# Patient Record
Sex: Female | Born: 1935 | Race: Black or African American | Hispanic: No | Smoking: Never smoker
Health system: Southern US, Community
[De-identification: ages and names within clinical notes are randomized; demographics above are authoritative.]

## PROBLEM LIST (undated history)

## (undated) DIAGNOSIS — E785 Hyperlipidemia, unspecified: Secondary | ICD-10-CM

## (undated) DIAGNOSIS — F439 Reaction to severe stress, unspecified: Secondary | ICD-10-CM

## (undated) DIAGNOSIS — E7800 Pure hypercholesterolemia, unspecified: Secondary | ICD-10-CM

## (undated) DIAGNOSIS — M810 Age-related osteoporosis without current pathological fracture: Secondary | ICD-10-CM

## (undated) DIAGNOSIS — I10 Essential (primary) hypertension: Secondary | ICD-10-CM

## (undated) DIAGNOSIS — J45909 Unspecified asthma, uncomplicated: Secondary | ICD-10-CM

## (undated) HISTORY — DX: Age-related osteoporosis without current pathological fracture: M81.0

## (undated) HISTORY — DX: Essential (primary) hypertension: I10

## (undated) HISTORY — DX: Hyperlipidemia, unspecified: E78.5

## (undated) HISTORY — DX: Reaction to severe stress, unspecified: F43.9

---

## 1997-08-12 ENCOUNTER — Ambulatory Visit (HOSPITAL_COMMUNITY): Admission: RE | Admit: 1997-08-12 | Discharge: 1997-08-12 | Payer: Self-pay | Admitting: *Deleted

## 1998-11-24 ENCOUNTER — Other Ambulatory Visit: Admission: RE | Admit: 1998-11-24 | Discharge: 1998-11-24 | Payer: Self-pay | Admitting: Internal Medicine

## 1999-01-14 ENCOUNTER — Ambulatory Visit (HOSPITAL_COMMUNITY): Admission: RE | Admit: 1999-01-14 | Discharge: 1999-01-14 | Payer: Self-pay | Admitting: Gastroenterology

## 1999-10-26 ENCOUNTER — Encounter: Payer: Self-pay | Admitting: Internal Medicine

## 1999-10-26 ENCOUNTER — Encounter: Admission: RE | Admit: 1999-10-26 | Discharge: 1999-10-26 | Payer: Self-pay | Admitting: Internal Medicine

## 2000-10-27 ENCOUNTER — Encounter: Admission: RE | Admit: 2000-10-27 | Discharge: 2000-10-27 | Payer: Self-pay | Admitting: Internal Medicine

## 2000-10-27 ENCOUNTER — Encounter: Payer: Self-pay | Admitting: Internal Medicine

## 2001-05-29 ENCOUNTER — Encounter: Payer: Self-pay | Admitting: Internal Medicine

## 2001-05-29 ENCOUNTER — Encounter: Admission: RE | Admit: 2001-05-29 | Discharge: 2001-05-29 | Payer: Self-pay | Admitting: Internal Medicine

## 2001-10-30 ENCOUNTER — Encounter: Payer: Self-pay | Admitting: Internal Medicine

## 2001-10-30 ENCOUNTER — Encounter: Admission: RE | Admit: 2001-10-30 | Discharge: 2001-10-30 | Payer: Self-pay | Admitting: Internal Medicine

## 2002-04-22 ENCOUNTER — Other Ambulatory Visit: Admission: RE | Admit: 2002-04-22 | Discharge: 2002-04-22 | Payer: Self-pay | Admitting: Internal Medicine

## 2003-07-17 ENCOUNTER — Encounter: Admission: RE | Admit: 2003-07-17 | Discharge: 2003-07-17 | Payer: Self-pay | Admitting: Internal Medicine

## 2003-09-04 ENCOUNTER — Encounter: Admission: RE | Admit: 2003-09-04 | Discharge: 2003-09-04 | Payer: Self-pay | Admitting: Internal Medicine

## 2004-07-30 ENCOUNTER — Encounter: Admission: RE | Admit: 2004-07-30 | Discharge: 2004-07-30 | Payer: Self-pay | Admitting: Internal Medicine

## 2004-11-18 ENCOUNTER — Ambulatory Visit (HOSPITAL_COMMUNITY): Admission: RE | Admit: 2004-11-18 | Discharge: 2004-11-18 | Payer: Self-pay | Admitting: Internal Medicine

## 2004-11-26 ENCOUNTER — Ambulatory Visit (HOSPITAL_COMMUNITY): Admission: RE | Admit: 2004-11-26 | Discharge: 2004-11-26 | Payer: Self-pay | Admitting: Internal Medicine

## 2005-03-28 ENCOUNTER — Ambulatory Visit (HOSPITAL_COMMUNITY): Admission: RE | Admit: 2005-03-28 | Discharge: 2005-03-28 | Payer: Self-pay | Admitting: Gastroenterology

## 2005-05-13 ENCOUNTER — Encounter: Admission: RE | Admit: 2005-05-13 | Discharge: 2005-05-13 | Payer: Self-pay | Admitting: Internal Medicine

## 2005-08-18 ENCOUNTER — Encounter: Admission: RE | Admit: 2005-08-18 | Discharge: 2005-08-18 | Payer: Self-pay | Admitting: Internal Medicine

## 2005-08-30 ENCOUNTER — Encounter: Admission: RE | Admit: 2005-08-30 | Discharge: 2005-08-30 | Payer: Self-pay | Admitting: Internal Medicine

## 2006-08-31 ENCOUNTER — Encounter: Admission: RE | Admit: 2006-08-31 | Discharge: 2006-08-31 | Payer: Self-pay | Admitting: Internal Medicine

## 2007-09-03 ENCOUNTER — Encounter: Admission: RE | Admit: 2007-09-03 | Discharge: 2007-09-03 | Payer: Self-pay | Admitting: Internal Medicine

## 2008-05-08 ENCOUNTER — Other Ambulatory Visit: Admission: RE | Admit: 2008-05-08 | Discharge: 2008-05-08 | Payer: Self-pay | Admitting: Internal Medicine

## 2008-09-04 ENCOUNTER — Encounter: Admission: RE | Admit: 2008-09-04 | Discharge: 2008-09-04 | Payer: Self-pay | Admitting: Internal Medicine

## 2009-09-07 ENCOUNTER — Encounter: Admission: RE | Admit: 2009-09-07 | Discharge: 2009-09-07 | Payer: Self-pay | Admitting: Internal Medicine

## 2010-09-23 ENCOUNTER — Other Ambulatory Visit: Payer: Self-pay | Admitting: Internal Medicine

## 2010-09-23 DIAGNOSIS — Z1231 Encounter for screening mammogram for malignant neoplasm of breast: Secondary | ICD-10-CM

## 2010-09-29 ENCOUNTER — Ambulatory Visit
Admission: RE | Admit: 2010-09-29 | Discharge: 2010-09-29 | Disposition: A | Discharge status: Home / Self Care | Payer: Medicare Other | Source: Ambulatory Visit | Attending: Internal Medicine | Admitting: Internal Medicine

## 2010-09-29 DIAGNOSIS — Z1231 Encounter for screening mammogram for malignant neoplasm of breast: Secondary | ICD-10-CM

## 2010-11-12 NOTE — Op Note (Signed)
Sarah Valenzuela, Sarah Valenzuela               ACCOUNT NO.:  [id number]   MEDICAL RECORD NO.:  [id number]          PATIENT TYPE:  AMB   LOCATION:  ENDO                         FACILITY:  [facility]   PHYSICIAN:  Anselmo Rod, M.D.  DATE OF BIRTH:  08-01-1931   DATE OF PROCEDURE:  03/28/2005  DATE OF DISCHARGE:                                 OPERATIVE REPORT   PROCEDURE PERFORMED:  Screening colonoscopy.   ENDOSCOPIST:  Charna Elizabeth, M.D.   INSTRUMENT USED:  Olympus video colonoscope.   INDICATIONS FOR PROCEDURE:  The patient is a 77 year old African-American  female with a history of adenomatous polyps removed in the past.  Rule out  recurrent polyps.   PREPROCEDURE PREPARATION:  Informed consent was procured from the patient.  The patient was fasted for eight hours prior to the procedure and prepped  OsmoPrep pills the night prior to and the morning of the procedure.  The  risks and benefits of the procedure including a 10% miss rate for polyps or  cancers was discussed with the patient as well.   PREPROCEDURE PHYSICAL:  The patient had stable vital signs.  Neck supple.  Chest clear to auscultation.  S1 and S2 regular.  Abdomen soft with normal  bowel sounds.   DESCRIPTION OF PROCEDURE:  The patient was placed in left lateral decubitus  position and sedated with 75 mg of Demerol and 5 mg of Versed in slow  incremental doses.  Once the patient was adequately sedated and maintained  on low flow oxygen and continuous cardiac monitoring, the Olympus video  colonoscope was advanced from the rectum to the cecum with difficulty.  There was a large amount of residual stool in the colon.  No masses, polyps,  erosions, or ulcerations were seen.  There was no evidence of  diverticulosis.  Melanosis coli was noted throughout the colonic mucosa with  more prominent changes in the right colon.  Retroflexion in the rectum  revealed no abnormalities.  Small internal hemorrhoids were seen on anal  inspection.  The prep was poor, small lesions could have been missed.   IMPRESSION:  1.  Melanosis coli seen throughout the colonic mucosa with more prominent      changes in the right colon.  2.  Small internal hemorrhoids.  3.  Large amount of residual stool in the colon.  No masses or polyps seen.   RECOMMENDATIONS:  1.  Continue high fiber diet with liberal fluid intake.  2.  Repeat colonoscopy in the next five years unless the patient develops      any abnormal symptoms in the interim.  3.  Outpatient followup as need arises in the future.      Anselmo Rod, M.D.  Electronically Signed     JNM/MEDQ  D:  03/28/2005  T:  03/28/2005  Job:  [id number]   cc:   Minerva Areola L. August Saucer, M.D.  Fax: [phone]

## 2011-08-30 ENCOUNTER — Other Ambulatory Visit: Payer: Self-pay | Admitting: Internal Medicine

## 2011-08-30 DIAGNOSIS — Z1231 Encounter for screening mammogram for malignant neoplasm of breast: Secondary | ICD-10-CM

## 2011-10-13 ENCOUNTER — Ambulatory Visit: Payer: BC Managed Care – PPO

## 2011-10-19 ENCOUNTER — Ambulatory Visit
Admission: RE | Admit: 2011-10-19 | Discharge: 2011-10-19 | Disposition: A | Discharge status: Home / Self Care | Payer: BC Managed Care – PPO | Source: Ambulatory Visit | Attending: Internal Medicine | Admitting: Internal Medicine

## 2011-10-19 DIAGNOSIS — Z1231 Encounter for screening mammogram for malignant neoplasm of breast: Secondary | ICD-10-CM

## 2012-06-19 ENCOUNTER — Emergency Department (HOSPITAL_COMMUNITY)
Admission: EM | Admit: 2012-06-19 | Discharge: 2012-06-19 | Disposition: A | Discharge status: Home / Self Care | Payer: No Typology Code available for payment source | Attending: Emergency Medicine | Admitting: Emergency Medicine

## 2012-06-19 ENCOUNTER — Emergency Department (HOSPITAL_COMMUNITY): Payer: No Typology Code available for payment source

## 2012-06-19 ENCOUNTER — Encounter (HOSPITAL_COMMUNITY): Payer: Self-pay | Admitting: *Deleted

## 2012-06-19 DIAGNOSIS — J45909 Unspecified asthma, uncomplicated: Secondary | ICD-10-CM | POA: Insufficient documentation

## 2012-06-19 DIAGNOSIS — S0990XA Unspecified injury of head, initial encounter: Secondary | ICD-10-CM | POA: Insufficient documentation

## 2012-06-19 DIAGNOSIS — Y92410 Unspecified street and highway as the place of occurrence of the external cause: Secondary | ICD-10-CM | POA: Insufficient documentation

## 2012-06-19 DIAGNOSIS — IMO0002 Reserved for concepts with insufficient information to code with codable children: Secondary | ICD-10-CM | POA: Insufficient documentation

## 2012-06-19 DIAGNOSIS — Z79899 Other long term (current) drug therapy: Secondary | ICD-10-CM | POA: Insufficient documentation

## 2012-06-19 DIAGNOSIS — T07XXXA Unspecified multiple injuries, initial encounter: Secondary | ICD-10-CM | POA: Insufficient documentation

## 2012-06-19 DIAGNOSIS — Y9389 Activity, other specified: Secondary | ICD-10-CM | POA: Insufficient documentation

## 2012-06-19 DIAGNOSIS — E7800 Pure hypercholesterolemia, unspecified: Secondary | ICD-10-CM | POA: Insufficient documentation

## 2012-06-19 DIAGNOSIS — Z7982 Long term (current) use of aspirin: Secondary | ICD-10-CM | POA: Insufficient documentation

## 2012-06-19 HISTORY — DX: Unspecified asthma, uncomplicated: J45.909

## 2012-06-19 HISTORY — DX: Pure hypercholesterolemia, unspecified: E78.00

## 2012-06-19 MED ORDER — ACETAMINOPHEN 500 MG PO TABS: 500.0000 mg | ORAL_TABLET | Freq: Four times a day (QID) | ORAL | Status: AC | PRN

## 2012-06-19 MED ORDER — ACETAMINOPHEN 325 MG PO TABS
650.0000 mg | ORAL_TABLET | Freq: Once | ORAL | Status: AC
  Administered 2012-06-19: 650 mg via ORAL
  Filled 2012-06-19: qty 2

## 2012-06-19 NOTE — ED Provider Notes (Signed)
Medical screening examination/treatment/procedure(s) were performed by non-physician practitioner and as supervising physician I was immediately available for consultation/collaboration.  Ethelda Chick, MD 06/19/12 [phone]
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
## 2012-06-19 NOTE — ED Notes (Addendum)
Called to check delay on xray. Radiology sts pt is next.

## 2012-09-17 ENCOUNTER — Other Ambulatory Visit: Payer: Self-pay

## 2012-09-17 DIAGNOSIS — Z1231 Encounter for screening mammogram for malignant neoplasm of breast: Secondary | ICD-10-CM

## 2012-10-17 LAB — BASIC METABOLIC PANEL
Glucose: 83 mg/dL
Potassium: 4.1 mmol/L (ref 3.4–5.3)
Sodium: 142 mmol/L (ref 137–147)

## 2012-10-24 ENCOUNTER — Ambulatory Visit
Admission: RE | Admit: 2012-10-24 | Discharge: 2012-10-24 | Disposition: A | Discharge status: Home / Self Care | Payer: Medicare PPO | Source: Ambulatory Visit

## 2012-10-24 DIAGNOSIS — Z1231 Encounter for screening mammogram for malignant neoplasm of breast: Secondary | ICD-10-CM

## 2012-10-30 ENCOUNTER — Encounter: Payer: Self-pay | Admitting: Hematology

## 2012-11-14 ENCOUNTER — Encounter: Payer: Self-pay | Admitting: Internal Medicine

## 2012-11-14 ENCOUNTER — Ambulatory Visit (INDEPENDENT_AMBULATORY_CARE_PROVIDER_SITE_OTHER): Payer: Medicare PPO | Admitting: Internal Medicine

## 2012-11-14 VITALS — BP 120/58 | HR 77 | Temp 97.7°F | Ht <= 58 in | Wt 118.0 lb

## 2012-11-14 DIAGNOSIS — R259 Unspecified abnormal involuntary movements: Secondary | ICD-10-CM

## 2012-11-14 DIAGNOSIS — E785 Hyperlipidemia, unspecified: Secondary | ICD-10-CM | POA: Insufficient documentation

## 2012-11-14 DIAGNOSIS — F4323 Adjustment disorder with mixed anxiety and depressed mood: Secondary | ICD-10-CM

## 2012-11-14 DIAGNOSIS — I10 Essential (primary) hypertension: Secondary | ICD-10-CM | POA: Insufficient documentation

## 2012-11-14 DIAGNOSIS — J302 Other seasonal allergic rhinitis: Secondary | ICD-10-CM | POA: Insufficient documentation

## 2012-11-14 DIAGNOSIS — L293 Anogenital pruritus, unspecified: Secondary | ICD-10-CM

## 2012-11-14 DIAGNOSIS — R252 Cramp and spasm: Secondary | ICD-10-CM

## 2012-11-14 DIAGNOSIS — R251 Tremor, unspecified: Secondary | ICD-10-CM

## 2012-11-14 DIAGNOSIS — N898 Other specified noninflammatory disorders of vagina: Secondary | ICD-10-CM

## 2012-11-14 NOTE — Progress Notes (Signed)
Subjective:     Patient ID: Sarah Valenzuela, female   DOB: [birth year], 79 y.o.   MRN: [id number]  HPI: Patient here for followup visit regarding vaginal itching. She reports that the itching has improved with the use of Vagisil. She's also continue to use her Premarin cream intravaginally as previously prescribed. The patient continues to deny any discharge or pain associated with vaginal itching  Patient also complains of tremors as she has before. On her last visit we checked her TSH was found to be within normal limits. Additionally her electrolytes were found to be normal. The patient states that while she does not have noticeable shaking, she has a sensation of a vibrating occurring often times when she is sitting or just before going to bed at night.  With regard to her seasonal allergies, the patient states she's had very little symptoms and that her Zyrtec appears to be working well  Review of Systems  Constitutional: Negative.   HENT: Negative.   Eyes: Negative.   Genitourinary: Negative.        Vaginal itching  Musculoskeletal: Negative.   Allergic/Immunologic: Positive for environmental allergies.       Objective:   Physical Exam  Constitutional: She is oriented to person, place, and time. She appears well-developed and well-nourished.  HENT:  Head: Normocephalic and atraumatic.  Nose: Nose normal.  Mouth/Throat: Oropharynx is clear and moist.  Eyes: Conjunctivae and EOM are normal. Pupils are equal, round, and reactive to light.  Cardiovascular: Normal rate, regular rhythm and normal heart sounds.   Pulmonary/Chest: Effort normal and breath sounds normal.  Musculoskeletal: Normal range of motion.  Neurological: She is alert and oriented to person, place, and time. She has normal reflexes. No cranial nerve deficit. She exhibits normal muscle tone. Coordination normal.       Assessment:     #1 Vaginal itching: The symptoms appear to be improved with the use of vessel.  I've advised patient to continue using Vagisil on when necessary basis and continue her Premarin intravaginally. She has 2 refills left on the Premarin we'll continue  #2 Vibratory sensation in tremors: This appears to be a sensory disruption it does not appear to have an impact on function at this time. I've advised the patient to take a multivitamin I also check a B12 and folate level tissue no deficits there.  #3 Hypertension: Blood pressure well-controlled at this time. We'll continue Maxzide  #4 Seasonal allergy: Well controlled on Zyrtec. Continue as needed  #5 Asthma: Patient has had no recent flares of her asthma. Well-controlled on current medications. Continue     Plan:     Please see above for plans regarding specific problems. Labs: B12 and folate Medications: Multivitamin Followup: Return to clinic in 3 months for 30 minute visit with M.D.  Time spent 25 minute

## 2013-02-11 ENCOUNTER — Other Ambulatory Visit: Payer: Medicare PPO | Admitting: *Deleted

## 2013-02-11 DIAGNOSIS — I10 Essential (primary) hypertension: Secondary | ICD-10-CM

## 2013-02-11 DIAGNOSIS — R251 Tremor, unspecified: Secondary | ICD-10-CM

## 2013-02-11 LAB — LIPID PANEL
Cholesterol: 159 mg/dL (ref 0–200)
HDL: 85 mg/dL (ref >39)
LDL Cholesterol: 65 mg/dL (ref 0–99)
Triglycerides: 44 mg/dL (ref <150)

## 2013-02-11 LAB — COMPREHENSIVE METABOLIC PANEL
AST: 19 U/L (ref 0–37)
Albumin: 4.3 g/dL (ref 3.5–5.2)
BUN: 11 mg/dL (ref 6–23)
Calcium: 9.7 mg/dL (ref 8.4–10.5)
Chloride: 104 mEq/L (ref 96–112)
Creat: 0.77 mg/dL (ref 0.50–1.10)
Glucose, Bld: 85 mg/dL (ref 70–99)
Potassium: 4 mEq/L (ref 3.5–5.3)

## 2013-02-12 LAB — CBC WITH DIFFERENTIAL/PLATELET
Basophils Absolute: 0 10*3/uL (ref 0.0–0.1)
Basophils Relative: 1 % (ref 0–1)
Eosinophils Absolute: 0.2 10*3/uL (ref 0.0–0.7)
Eosinophils Relative: 3 % (ref 0–5)
HCT: 37.2 % (ref 36.0–46.0)
Hemoglobin: 12.1 g/dL (ref 12.0–15.0)
MCH: 30.5 pg (ref 26.0–34.0)
MCHC: 32.5 g/dL (ref 30.0–36.0)
MCV: 93.7 fL (ref 78.0–100.0)
Monocytes Absolute: 0.3 10*3/uL (ref 0.1–1.0)
Monocytes Relative: 5 % (ref 3–12)
Neutro Abs: 3.9 10*3/uL (ref 1.7–7.7)
RDW: 14.1 % (ref 11.5–15.5)

## 2013-02-13 LAB — VITAMIN B12: Vitamin B-12: 814 pg/mL (ref 211–911)

## 2013-02-18 ENCOUNTER — Encounter: Payer: Self-pay | Admitting: Internal Medicine

## 2013-02-18 ENCOUNTER — Ambulatory Visit (INDEPENDENT_AMBULATORY_CARE_PROVIDER_SITE_OTHER): Payer: Medicare PPO | Admitting: Internal Medicine

## 2013-02-18 VITALS — BP 126/59 | HR 71 | Temp 97.6°F | Resp 18 | Ht <= 58 in | Wt 119.0 lb

## 2013-02-18 DIAGNOSIS — I10 Essential (primary) hypertension: Secondary | ICD-10-CM

## 2013-02-18 DIAGNOSIS — E785 Hyperlipidemia, unspecified: Secondary | ICD-10-CM

## 2013-02-18 DIAGNOSIS — R259 Unspecified abnormal involuntary movements: Secondary | ICD-10-CM

## 2013-02-18 DIAGNOSIS — R251 Tremor, unspecified: Secondary | ICD-10-CM

## 2013-02-18 NOTE — Progress Notes (Signed)
  Subjective:    Patient ID: Sarah Valenzuela, female    DOB: Oct 25, 1931, 79 y.o.   MRN: [id number]  HPI: Pt here for follow up on issues of Vaginal dryness, and tremors. She states that the vaginal dryness has resolved and that she has not not had any interruption in her sleep as a result of the tremors. However she sometimes has a vibration occurring in the vaginal area.    She is also her to follow up on her labs an HTN.   Review of Systems  Constitutional: Negative.   HENT: Negative.   Eyes: Negative.   Respiratory: Negative.   Cardiovascular: Negative.   Gastrointestinal: Negative.   Endocrine: Negative.   Genitourinary: Negative.  Negative for vaginal pain.  Musculoskeletal: Negative for myalgias.  Skin: Negative.   Allergic/Immunologic: Negative.   Neurological: Negative.   Hematological: Negative.   Psychiatric/Behavioral: Negative.        Objective:   Physical Exam  Constitutional: She is oriented to person, place, and time. She appears well-developed and well-nourished.  HENT:  Head: Normocephalic and atraumatic.  Eyes: Conjunctivae and EOM are normal. Pupils are equal, round, and reactive to light. No scleral icterus.  Neck: Normal range of motion. Neck supple. No JVD present. No thyromegaly present.  Cardiovascular: Normal rate and regular rhythm.  Exam reveals no gallop and no friction rub.   No murmur heard. Pulmonary/Chest: Effort normal and breath sounds normal. She has no wheezes. She has no rales.  Abdominal: Soft. Bowel sounds are normal. She exhibits no distension and no mass. There is no tenderness.  Musculoskeletal: Normal range of motion.  Lymphadenopathy:    She has no cervical adenopathy.  Neurological: She is alert and oriented to person, place, and time. No cranial nerve deficit.  Skin: Skin is warm and dry.  Psychiatric: She has a normal mood and affect. Her behavior is normal. Judgment and thought content normal.          Assessment & Plan:   1. HTN: BP well controlled and she shows no evidence of renal injury. Continue current medications and follow up in 6 months.  2. Vaginal dryness: improved wit use of estrogen suppositories now that properly applied.  3. Tremors: pt complaining of tremors in the vaginal area and is concerned about Parkinson's disease. I have re-assured her that she has not described any symptoms and her physical examination has no signs consistent with Parkinson's.  RTC 6 months

## 2013-04-01 ENCOUNTER — Ambulatory Visit (INDEPENDENT_AMBULATORY_CARE_PROVIDER_SITE_OTHER): Payer: Medicare PPO | Admitting: Hematology

## 2013-04-01 DIAGNOSIS — Z23 Encounter for immunization: Secondary | ICD-10-CM

## 2013-04-19 ENCOUNTER — Telehealth: Payer: Self-pay | Admitting: *Deleted

## 2013-04-19 MED ORDER — IPRATROPIUM BROMIDE HFA 17 MCG/ACT IN AERS: 2.0000 | INHALATION_SPRAY | Freq: Three times a day (TID) | RESPIRATORY_TRACT | Status: DC | PRN

## 2013-04-19 NOTE — Telephone Encounter (Signed)
Atrovent HFA  Inhaler 12.90 GM  Use 2 puffs four times a day shake well

## 2013-05-17 ENCOUNTER — Telehealth: Payer: Self-pay | Admitting: *Deleted

## 2013-05-17 ENCOUNTER — Other Ambulatory Visit: Payer: Self-pay | Admitting: Hematology

## 2013-05-17 MED ORDER — TRIAMTERENE-HCTZ 37.5-25 MG PO TABS: 0.5000 | ORAL_TABLET | Freq: Every day | ORAL | Status: DC

## 2013-05-17 NOTE — Telephone Encounter (Signed)
Rx refill for Triamterene-HCTZ 37.5-25 take 1/2 tab daily #30

## 2013-07-16 ENCOUNTER — Telehealth: Payer: Self-pay | Admitting: Internal Medicine

## 2013-07-16 DIAGNOSIS — J302 Other seasonal allergic rhinitis: Secondary | ICD-10-CM

## 2013-07-16 NOTE — Telephone Encounter (Signed)
[facility] Accolate now requiring prior authorization.Patient wants to know if she needs to switch to generic Zafirlukast. Patient will need RX by 07/18/13

## 2013-07-17 MED ORDER — ZAFIRLUKAST 20 MG PO TABS: 20.0000 mg | ORAL_TABLET | Freq: Two times a day (BID) | ORAL | Status: DC

## 2013-07-17 NOTE — Telephone Encounter (Signed)
Prescription routed to Pharmacy for generic Zafirlukast.

## 2013-08-21 ENCOUNTER — Ambulatory Visit (INDEPENDENT_AMBULATORY_CARE_PROVIDER_SITE_OTHER): Payer: Medicare PPO | Admitting: Family Medicine

## 2013-08-21 ENCOUNTER — Encounter: Payer: Self-pay | Admitting: Family Medicine

## 2013-08-21 VITALS — BP 127/79 | HR 87 | Temp 97.0°F | Resp 20 | Wt 118.0 lb

## 2013-08-21 DIAGNOSIS — I10 Essential (primary) hypertension: Secondary | ICD-10-CM

## 2013-08-21 DIAGNOSIS — F4323 Adjustment disorder with mixed anxiety and depressed mood: Secondary | ICD-10-CM

## 2013-08-21 DIAGNOSIS — Z23 Encounter for immunization: Secondary | ICD-10-CM

## 2013-08-21 DIAGNOSIS — R251 Tremor, unspecified: Secondary | ICD-10-CM

## 2013-08-21 DIAGNOSIS — R259 Unspecified abnormal involuntary movements: Secondary | ICD-10-CM

## 2013-08-21 NOTE — Progress Notes (Signed)
   Subjective:    Patient ID: Sarah Valenzuela, female    DOB: 05-31-1932, 80 y.o.   MRN: [id number]  HPI Pt in office  for follow up on issues of vaginal dryness, and tremors. She states that she is no longer using estrogen suppositories due to cost and Vagisil is working great for vaginal dryness. In addition, patient reports that she has occasional vibrations to vaginal area upon laying down. Patient states that vibrations do not cause sleep interruptions.   Patient states that she takes hypertension medications consistently. Maintains that she exercises at least 3 times per week and stays active.     Review of Systems  Constitutional: Negative for chills and fatigue.  Eyes: Negative.   Respiratory: Negative.   Cardiovascular: Negative.   Gastrointestinal: Positive for constipation. Negative for nausea, vomiting, blood in stool and rectal pain.  Endocrine: Negative.   Genitourinary: Negative for dysuria, urgency and pelvic pain.  Neurological: Positive for tremors (To vaginal area).  Psychiatric/Behavioral: Positive for sleep disturbance. The patient is nervous/anxious (occasionally when she has to make decisions).        Objective:   Physical Exam  Constitutional: She is oriented to person, place, and time. She appears well-developed and well-nourished.  HENT:  Head: Normocephalic and atraumatic.  Eyes: Conjunctivae are normal. Pupils are equal, round, and reactive to light.  Neck: Normal range of motion.  Cardiovascular: Normal rate, regular rhythm and normal heart sounds.   Pulmonary/Chest: Effort normal and breath sounds normal.  Abdominal: Soft. Bowel sounds are normal.  Musculoskeletal: Normal range of motion.  Neurological: She is alert and oriented to person, place, and time.  Skin: Skin is warm and dry.          Assessment & Plan:  1. Vaginal dryness: Continue to utilize  Vagisil gel as needed for vaginal dryness. Patient does not exhibit tremors on examination.   Eye health: Dr. Elmer PickerHecker for cataract surgery  2. Hypertension: Controlled with current medication regiment. Patient is starting [employer], an exercise program for seniors at the [facility], in March. Patient is eating a low sodium balanced diet and remaining active.  Patient will return to office in 1 week for labs. Patient unable to provide urine sample today to check for proteinuria.   3. Anxiety and depression: Patient states that symptoms have improved. Patient states that she stays active and has a lot of friends at church. Finds that her depression was linked to loneliness after the loss of her spouse. Patient completed grief counseling through hospice and  states that she is coping well.   Preventative care: Patient has yearly appt scheduled with Dr. Elmer PickerHecker, ophthalmologist. TDap vaccination received without complication Labs: complete metabolic panel and urinalysis in 1 week  RTC: 1 month

## 2013-08-29 ENCOUNTER — Other Ambulatory Visit: Payer: Medicare PPO

## 2013-08-29 DIAGNOSIS — I10 Essential (primary) hypertension: Secondary | ICD-10-CM

## 2013-08-29 LAB — COMPLETE METABOLIC PANEL WITH GFR
ALT: 12 U/L (ref 0–35)
AST: 21 U/L (ref 0–37)
Albumin: 4.6 g/dL (ref 3.5–5.2)
Alkaline Phosphatase: 45 U/L (ref 39–117)
BILIRUBIN TOTAL: 0.6 mg/dL (ref 0.2–1.2)
BUN: 12 mg/dL (ref 6–23)
CO2: 30 meq/L (ref 19–32)
Calcium: 9.5 mg/dL (ref 8.4–10.5)
Chloride: 101 mEq/L (ref 96–112)
Creat: 0.82 mg/dL (ref 0.50–1.10)
GFR, EST NON AFRICAN AMERICAN: 68 mL/min
GFR, Est African American: 79 mL/min
GLUCOSE: 84 mg/dL (ref 70–99)
Potassium: 3.7 mEq/L (ref 3.5–5.3)
SODIUM: 138 meq/L (ref 135–145)
TOTAL PROTEIN: 7.2 g/dL (ref 6.0–8.3)

## 2013-08-29 LAB — URINALYSIS
Bilirubin Urine: NEGATIVE
Glucose, UA: NEGATIVE mg/dL
HGB URINE DIPSTICK: NEGATIVE
KETONES UR: NEGATIVE mg/dL
Leukocytes, UA: NEGATIVE
NITRITE: NEGATIVE
Protein, ur: NEGATIVE mg/dL
SPECIFIC GRAVITY, URINE: 1.015 (ref 1.005–1.030)
Urobilinogen, UA: 0.2 mg/dL (ref 0.0–1.0)
pH: 6 (ref 5.0–8.0)

## 2013-08-30 ENCOUNTER — Telehealth: Payer: Self-pay | Admitting: Family Medicine

## 2013-08-30 NOTE — Telephone Encounter (Signed)
All lab results are within normal limits. Please continue current medication regimen and follow up in office as scheduled.

## 2013-09-02 ENCOUNTER — Telehealth: Payer: Self-pay

## 2013-09-02 NOTE — Telephone Encounter (Signed)
   Type Contact Phone    09/02/2013 10:48 AM Phone (Outgoing) Sarah Valenzuela, Sarah Valenzuela (Self) [phone] (H)    Left Message- Pt was contacted and VM was left to plz call ofice to recieve her most recent lab results.    09/02/2013 1:27 PM Phone (Incoming) Sarah Valenzuela, Sarah Valenzuela (Self) [phone] (H)    Pt contacted office and lab results given. Pt voiced understanding as well as being pleased w/ outcome.

## 2013-09-20 ENCOUNTER — Other Ambulatory Visit: Payer: Self-pay

## 2013-09-20 DIAGNOSIS — Z1231 Encounter for screening mammogram for malignant neoplasm of breast: Secondary | ICD-10-CM

## 2013-10-30 ENCOUNTER — Ambulatory Visit
Admission: RE | Admit: 2013-10-30 | Discharge: 2013-10-30 | Disposition: A | Discharge status: Home / Self Care | Payer: Medicare PPO | Source: Ambulatory Visit

## 2013-10-30 ENCOUNTER — Encounter (INDEPENDENT_AMBULATORY_CARE_PROVIDER_SITE_OTHER): Payer: Self-pay

## 2013-10-30 DIAGNOSIS — Z1231 Encounter for screening mammogram for malignant neoplasm of breast: Secondary | ICD-10-CM

## 2013-11-20 ENCOUNTER — Ambulatory Visit (INDEPENDENT_AMBULATORY_CARE_PROVIDER_SITE_OTHER): Payer: Medicare PPO | Admitting: Family Medicine

## 2013-11-20 ENCOUNTER — Encounter: Payer: Self-pay | Admitting: Family Medicine

## 2013-11-20 VITALS — BP 115/58 | HR 65 | Temp 98.0°F | Resp 20 | Wt 120.0 lb

## 2013-11-20 DIAGNOSIS — E785 Hyperlipidemia, unspecified: Secondary | ICD-10-CM

## 2013-11-20 DIAGNOSIS — R259 Unspecified abnormal involuntary movements: Secondary | ICD-10-CM

## 2013-11-20 DIAGNOSIS — R251 Tremor, unspecified: Secondary | ICD-10-CM

## 2013-11-20 DIAGNOSIS — J309 Allergic rhinitis, unspecified: Secondary | ICD-10-CM

## 2013-11-20 DIAGNOSIS — I10 Essential (primary) hypertension: Secondary | ICD-10-CM

## 2013-11-20 DIAGNOSIS — J302 Other seasonal allergic rhinitis: Secondary | ICD-10-CM

## 2013-11-20 DIAGNOSIS — F4323 Adjustment disorder with mixed anxiety and depressed mood: Secondary | ICD-10-CM

## 2013-11-20 NOTE — Progress Notes (Signed)
   Subjective:    Patient ID: Sarah Valenzuela, female    DOB: Aug 11, 1931, 80 y.o.   MRN: [id number]  HPI Patient with a history of hypertension presents for follow-up of chronic diseases. Reports that she is taking medications consistently and overall she feels good.    Review of Systems  Constitutional: Negative.   HENT: Negative.   Eyes: Negative.   Respiratory: Negative.   Cardiovascular: Negative.   Gastrointestinal: Negative.   Endocrine: Negative.   Genitourinary: Negative.   Musculoskeletal: Negative.   Skin: Negative.   Allergic/Immunologic: Negative.   Neurological: Positive for tremors (Intermittent  vaginal tremors at night).  Hematological: Negative.   Psychiatric/Behavioral: Negative.        Objective:   Physical Exam  Constitutional: She is oriented to person, place, and time. Vital signs are normal. She appears well-developed and well-nourished.  HENT:  Head: Normocephalic and atraumatic.  Eyes: Conjunctivae and EOM are normal. Pupils are equal, round, and reactive to light. Lids are everted and swept, no foreign bodies found.  Neck: Normal range of motion. Neck supple.  Cardiovascular: Normal rate, regular rhythm and normal heart sounds.   Pulmonary/Chest: Effort normal and breath sounds normal.  Abdominal: Soft. Normal appearance and bowel sounds are normal.  Musculoskeletal: Normal range of motion.  Neurological: She is alert and oriented to person, place, and time.  Skin: Skin is warm, dry and intact.  Psychiatric: She has a normal mood and affect. Her behavior is normal.          Assessment & Plan:  1. Vaginal dryness: Continue to utilize Vagisil gel as needed for vaginal dryness. Tremors occur occasionally at night.    2. Hypertension: Controlled with current medication regiment. Patient started [employer], an exercise program for seniors at the [facility], that she is enjoying very much. She states that the program is allowing her to get out of the  house and meet new people. . Patient is eating a low sodium balanced diet and remaining active.    3. Anxiety and depression: Patient states that symptoms have improved. Patient states that she stays active and has a lot of friends at church and through exercise program. Reports that she is adjusting daily to her spouse being gone. The loneliness and depression has improved since she has increased her daily activities. Patient completed grief counseling through hospice and states that she is coping well.  4. Seasonal allergies: Controlled 5. Asthma- Controlled on current medication regimen 6. Hyperlipidemia- Follows a lowfat, low salt diet and remains active.   Preventative care: Patient has yearly appt scheduled with Dr. Elmer Picker, ophthalmologist. Up to date on vaccinations   RTC: 6 months  Sarah Maroon, FNP

## 2013-12-20 ENCOUNTER — Telehealth: Payer: Self-pay | Admitting: Internal Medicine

## 2013-12-20 MED ORDER — IPRATROPIUM BROMIDE HFA 17 MCG/ACT IN AERS: 2.0000 | INHALATION_SPRAY | Freq: Three times a day (TID) | RESPIRATORY_TRACT | Status: DC | PRN

## 2013-12-20 NOTE — Telephone Encounter (Signed)
ipratropium (ATROVENT HFA) 17 MCG/ACT inhaler sent electronically to pharmacy/E-Prescribing Status: Sent to pharmacy (12/20/2013 3:12 PM EDT)

## 2014-01-13 ENCOUNTER — Other Ambulatory Visit: Payer: Self-pay

## 2014-01-13 MED ORDER — TRIAMTERENE-HCTZ 37.5-25 MG PO TABS: 0.5000 | ORAL_TABLET | Freq: Every day | ORAL | Status: DC

## 2014-01-13 MED ORDER — MOMETASONE FUROATE 50 MCG/ACT NA SUSP: 2.0000 | Freq: Every day | NASAL | Status: DC

## 2014-01-13 NOTE — Telephone Encounter (Signed)
Received fax for refill on triamterene/hctz and nasonex. Refilled and sent to pharmacy via e-script.

## 2014-03-13 ENCOUNTER — Other Ambulatory Visit: Payer: Self-pay

## 2014-03-13 DIAGNOSIS — J302 Other seasonal allergic rhinitis: Secondary | ICD-10-CM

## 2014-03-13 MED ORDER — ZAFIRLUKAST 20 MG PO TABS: 20.0000 mg | ORAL_TABLET | Freq: Two times a day (BID) | ORAL | Status: DC

## 2014-03-13 NOTE — Telephone Encounter (Signed)
Refilled zafirlukast  and sent in via e-scribe. Thanks!

## 2014-03-24 ENCOUNTER — Ambulatory Visit (INDEPENDENT_AMBULATORY_CARE_PROVIDER_SITE_OTHER): Payer: Medicare PPO

## 2014-03-24 DIAGNOSIS — Z0000 Encounter for general adult medical examination without abnormal findings: Secondary | ICD-10-CM

## 2014-05-05 ENCOUNTER — Other Ambulatory Visit: Payer: Self-pay | Admitting: Internal Medicine

## 2014-05-05 DIAGNOSIS — E785 Hyperlipidemia, unspecified: Secondary | ICD-10-CM

## 2014-05-05 DIAGNOSIS — I10 Essential (primary) hypertension: Secondary | ICD-10-CM

## 2014-05-05 DIAGNOSIS — Z0000 Encounter for general adult medical examination without abnormal findings: Secondary | ICD-10-CM

## 2014-05-07 ENCOUNTER — Other Ambulatory Visit: Payer: Medicare PPO

## 2014-05-07 DIAGNOSIS — Z0000 Encounter for general adult medical examination without abnormal findings: Secondary | ICD-10-CM

## 2014-05-07 DIAGNOSIS — E785 Hyperlipidemia, unspecified: Secondary | ICD-10-CM

## 2014-05-07 DIAGNOSIS — I10 Essential (primary) hypertension: Secondary | ICD-10-CM

## 2014-05-07 LAB — COMPREHENSIVE METABOLIC PANEL
ALK PHOS: 46 U/L (ref 39–117)
ALT: 14 U/L (ref 0–35)
AST: 24 U/L (ref 0–37)
Albumin: 4.1 g/dL (ref 3.5–5.2)
BILIRUBIN TOTAL: 0.6 mg/dL (ref 0.2–1.2)
BUN: 9 mg/dL (ref 6–23)
CO2: 26 mEq/L (ref 19–32)
CREATININE: 0.84 mg/dL (ref 0.50–1.10)
Calcium: 9.2 mg/dL (ref 8.4–10.5)
Chloride: 103 mEq/L (ref 96–112)
Glucose, Bld: 84 mg/dL (ref 70–99)
Potassium: 3.8 mEq/L (ref 3.5–5.3)
Sodium: 138 mEq/L (ref 135–145)
Total Protein: 7 g/dL (ref 6.0–8.3)

## 2014-05-07 LAB — CBC WITH DIFFERENTIAL/PLATELET
BASOS ABS: 0.1 10*3/uL (ref 0.0–0.1)
BASOS PCT: 1 % (ref 0–1)
Eosinophils Absolute: 0.3 10*3/uL (ref 0.0–0.7)
Eosinophils Relative: 5 % (ref 0–5)
HEMATOCRIT: 39.3 % (ref 36.0–46.0)
Hemoglobin: 13.1 g/dL (ref 12.0–15.0)
Lymphocytes Relative: 33 % (ref 12–46)
Lymphs Abs: 2.2 10*3/uL (ref 0.7–4.0)
MCH: 30.7 pg (ref 26.0–34.0)
MCHC: 33.3 g/dL (ref 30.0–36.0)
MCV: 92 fL (ref 78.0–100.0)
MONO ABS: 0.3 10*3/uL (ref 0.1–1.0)
Monocytes Relative: 5 % (ref 3–12)
Neutro Abs: 3.7 10*3/uL (ref 1.7–7.7)
Neutrophils Relative %: 56 % (ref 43–77)
Platelets: 225 10*3/uL (ref 150–400)
RBC: 4.27 MIL/uL (ref 3.87–5.11)
RDW: 13.8 % (ref 11.5–15.5)
WBC: 6.6 10*3/uL (ref 4.0–10.5)

## 2014-05-07 LAB — LIPID PANEL
CHOLESTEROL: 158 mg/dL (ref 0–200)
HDL: 89 mg/dL (ref >39)
LDL CALC: 61 mg/dL (ref 0–99)
TRIGLYCERIDES: 41 mg/dL (ref <150)
Total CHOL/HDL Ratio: 1.8 Ratio
VLDL: 8 mg/dL (ref 0–40)

## 2014-05-08 ENCOUNTER — Other Ambulatory Visit: Payer: Medicare PPO

## 2014-05-08 LAB — VITAMIN D 25 HYDROXY (VIT D DEFICIENCY, FRACTURES): Vit D, 25-Hydroxy: 52 ng/mL (ref 30–89)

## 2014-05-09 LAB — URINALYSIS
BILIRUBIN URINE: NEGATIVE
GLUCOSE, UA: NEGATIVE mg/dL
Hgb urine dipstick: NEGATIVE
Ketones, ur: NEGATIVE mg/dL
Leukocytes, UA: NEGATIVE
Nitrite: NEGATIVE
PH: 7.5 (ref 5.0–8.0)
Protein, ur: NEGATIVE mg/dL
Specific Gravity, Urine: 1.007 (ref 1.005–1.030)
Urobilinogen, UA: 0.2 mg/dL (ref 0.0–1.0)

## 2014-05-15 ENCOUNTER — Encounter: Payer: Medicare PPO | Admitting: Internal Medicine

## 2014-05-19 ENCOUNTER — Other Ambulatory Visit: Payer: Self-pay | Admitting: Internal Medicine

## 2014-05-19 MED ORDER — MOMETASONE FUROATE 50 MCG/ACT NA SUSP: 2.0000 | Freq: Every day | NASAL | Status: DC

## 2014-05-19 NOTE — Telephone Encounter (Signed)
Refill for nasonex sent into pharmacy. Thanks!

## 2014-05-30 ENCOUNTER — Ambulatory Visit (INDEPENDENT_AMBULATORY_CARE_PROVIDER_SITE_OTHER): Payer: Medicare PPO | Admitting: Family Medicine

## 2014-05-30 VITALS — BP 129/59 | HR 86 | Temp 98.2°F | Resp 16 | Ht <= 58 in | Wt 117.0 lb

## 2014-05-30 DIAGNOSIS — I10 Essential (primary) hypertension: Secondary | ICD-10-CM

## 2014-05-30 DIAGNOSIS — Z0000 Encounter for general adult medical examination without abnormal findings: Secondary | ICD-10-CM

## 2014-05-30 DIAGNOSIS — E785 Hyperlipidemia, unspecified: Secondary | ICD-10-CM

## 2014-05-30 DIAGNOSIS — N898 Other specified noninflammatory disorders of vagina: Secondary | ICD-10-CM

## 2014-05-30 DIAGNOSIS — F4321 Adjustment disorder with depressed mood: Secondary | ICD-10-CM | POA: Insufficient documentation

## 2014-05-30 DIAGNOSIS — F411 Generalized anxiety disorder: Secondary | ICD-10-CM | POA: Insufficient documentation

## 2014-05-30 MED ORDER — ALPRAZOLAM 0.25 MG PO TABS: 0.2500 mg | ORAL_TABLET | Freq: Every evening | ORAL | Status: DC | PRN

## 2014-05-30 NOTE — Progress Notes (Signed)
Subjective:    Patient ID: HIDIE TKACZYK, female    DOB: 27-Nov-1931, 80 y.o.   MRN: [id number]  HPI  Ms. Melvenia Beam, patient with a history of controlled hypertension presents for an annual physical exam. Ms. Tingen reports that she eats a balanced diet, exercises in a [employer] program 2-3 days per week, and remains active with her church. She states that she only sleeps 4-5 hours per night since her husband passed away several years ago. She is up to date on her vaccinations, has opthalmology and dental visits every 6 months. Ms. Bridson is post menopausal and does not have vaginal bleeding.   Ms. Wege reports that she has occasional vaginal dryness and tremors to vagina primarily at night. She states that the vaginal dryness has resolved with OTC Vagisil wipes. She states that she will have an interruption of sleep as a result of vaginal tremors. She denies vaginal pain and bleeding and describes as a vaginal vibration.   She also complains of periodic depression and anxiety during the holidays. She states that she has been having episodes periodically since her husband passed away. She reports that she experiences depression primarily when she is alone. She states that she tries to remain active with her church and organizations so that she is not alone. She often visits her daughter. Patient denies suicidal or homicidal ideations. She states that she has taken Xanax in the past for anxiety.   Review of Systems  HENT: Positive for hearing loss (Patient is followed by audiologist, hearing loss in right ear).   Eyes:       Dry eyes controlled by restasis and Systane  Respiratory: Negative for apnea, chest tightness and shortness of breath.   Cardiovascular: Negative.   Endocrine: Negative.   Genitourinary: Negative for vaginal discharge, difficulty urinating and vaginal pain.  Musculoskeletal: Negative.   Skin: Negative.   Neurological: Positive for headaches. Negative for  dizziness and tremors.  Hematological: Negative.   Psychiatric/Behavioral: Positive for sleep disturbance. The patient is nervous/anxious (Patient reports that anxiety increases closer to the holidays).        Objective:   Physical Exam  Constitutional: She is oriented to person, place, and time. She appears well-developed and well-nourished.  HENT:  Head: Normocephalic and atraumatic.  Right Ear: External ear normal.  Left Ear: External ear normal.  Mouth/Throat: Oropharynx is clear and moist.  Eyes: Conjunctivae and EOM are normal. Pupils are equal, round, and reactive to light.  Neck: Normal range of motion. Neck supple.  Cardiovascular: Normal rate, regular rhythm, normal heart sounds and intact distal pulses.   Abdominal: Soft. Bowel sounds are normal.  Musculoskeletal: Normal range of motion.  Neurological: She is alert and oriented to person, place, and time. She has normal reflexes.  Skin: Skin is warm and dry.  Psychiatric: She has a normal mood and affect. Her behavior is normal. Judgment and thought content normal.      BP 129/59 mmHg  Pulse 86  Temp(Src) 98.2 F (36.8 C) (Oral)  Resp 16  Ht 4\' 10"  (1.473 m)  Wt 117 lb (53.071 kg)  BMI 24.46 kg/m2    Assessment & Plan:   1. PE (physical exam), annual Reviewed labs with patient from 05/07/2014. All labs stable Ms. Cummiskey is up to day with vaccinations Last mammogram was 09/2013,  Reviewed report, wnl Last colonoscopy was in 2012 with Dr. Loreta Ave, [facility], reviewed report Followed by Dr. Sharyn Lull, cardiologist yearly Opthalmologist, Dr. Izell Belmont  every 6 months, next appointment is on Tuesday 06/03/2014.  - EKG 12-Lead  2. Essential hypertension Reviewed EKG. Patient continues to take Maxzide consistently for blood pressure.  Controlled with current medication regimen. She has a yearly appointment scheduled with cardiologist on 06/05/2014.  Patient started [employer], an exercise program for  seniors at the [employer], that she is enjoying very much. She states that the program is allowing her to get out of the house and meet new people. Patient is eating a low sodium balanced diet and remaining active.   3. Situational depression Patient states that symptoms have improved over time, but still experiences expression and anxiety during the holidays. Patient states that she stays active and has a lot of friends at church and through exercise program. Reports that she is adjusting daily to her spouse being gone. The loneliness and depression have improved since she has increased her daily activities. Patient is not interested in counseling at this time. I will re-start Xanax 0.25 mg prn before bed.   4. Anxiety state  - ALPRAZolam (XANAX) 0.25 MG tablet; Take 1 tablet (0.25 mg total) by mouth at bedtime as needed for anxiety.  Dispense: 30 tablet; Refill: 0   4. Hyperlipidemia  Reviewed lipid panel. ASCVD score is 5%.  Follows a lowfat, low salt diet and remains active. She continues a low dose of a statin due to risk.    5 Vaginal dryness She continue to have vibratory sensations to vagina, which do not have an impact on functioning at this time. I have reviewed previous labs, including B12 and folate, which are WNL. Continue to utilize Vagisil gel as needed for vaginal dryness.   RTC: 6 months hypertension, hyperlipidemia with Dr. Wynona Canes, FNP

## 2014-05-30 NOTE — Patient Instructions (Signed)
Hypertension Hypertension, commonly called high blood pressure, is when the force of blood pumping through your arteries is too strong. Your arteries are the blood vessels that carry blood from your heart throughout your body. A blood pressure reading consists of a higher number over a lower number, such as 110/72. The higher number (systolic) is the pressure inside your arteries when your heart pumps. The lower number (diastolic) is the pressure inside your arteries when your heart relaxes. Ideally you want your blood pressure below 120/80. Hypertension forces your heart to work harder to pump blood. Your arteries may become narrow or stiff. Having hypertension puts you at risk for heart disease, stroke, and other problems.  RISK FACTORS Some risk factors for high blood pressure are controllable. Others are not.  Risk factors you cannot control include:   Race. You may be at higher risk if you are African American.  Age. Risk increases with age.  Gender. Men are at higher risk than women before age 45 years. After age 65, women are at higher risk than men. Risk factors you can control include:  Not getting enough exercise or physical activity.  Being overweight.  Getting too much fat, sugar, calories, or salt in your diet.  Drinking too much alcohol. SIGNS AND SYMPTOMS Hypertension does not usually cause signs or symptoms. Extremely high blood pressure (hypertensive crisis) may cause headache, anxiety, shortness of breath, and nosebleed. DIAGNOSIS  To check if you have hypertension, your health care provider will measure your blood pressure while you are seated, with your arm held at the level of your heart. It should be measured at least twice using the same arm. Certain conditions can cause a difference in blood pressure between your right and left arms. A blood pressure reading that is higher than normal on one occasion does not mean that you need treatment. If one blood pressure reading  is high, ask your health care provider about having it checked again. TREATMENT  Treating high blood pressure includes making lifestyle changes and possibly taking medicine. Living a healthy lifestyle can help lower high blood pressure. You may need to change some of your habits. Lifestyle changes may include:  Following the DASH diet. This diet is high in fruits, vegetables, and whole grains. It is low in salt, red meat, and added sugars.  Getting at least 2 hours of brisk physical activity every week.  Losing weight if necessary.  Not smoking.  Limiting alcoholic beverages.  Learning ways to reduce stress. If lifestyle changes are not enough to get your blood pressure under control, your health care provider may prescribe medicine. You may need to take more than one. Work closely with your health care provider to understand the risks and benefits. HOME CARE INSTRUCTIONS  Have your blood pressure rechecked as directed by your health care provider.   Take medicines only as directed by your health care provider. Follow the directions carefully. Blood pressure medicines must be taken as prescribed. The medicine does not work as well when you skip doses. Skipping doses also puts you at risk for problems.   Do not smoke.   Monitor your blood pressure at home as directed by your health care provider. SEEK MEDICAL CARE IF:   You think you are having a reaction to medicines taken.  You have recurrent headaches or feel dizzy.  You have swelling in your ankles.  You have trouble with your vision. SEEK IMMEDIATE MEDICAL CARE IF:  You develop a severe headache or confusion.    You have unusual weakness, numbness, or feel faint.  You have severe chest or abdominal pain.  You vomit repeatedly.  You have trouble breathing. MAKE SURE YOU:   Understand these instructions.  Will watch your condition.  Will get help right away if you are not doing well or get worse. Document  Released: 06/13/2005 Document Revised: 10/28/2013 Document Reviewed: 04/05/2013 ExitCare Patient Information 2015 [employer]. This information is not intended to replace advice given to you by your health care provider. Make sure you discuss any questions you have with your health care provider. DASH Eating Plan DASH stands for "Dietary Approaches to Stop Hypertension." The DASH eating plan is a healthy eating plan that has been shown to reduce high blood pressure (hypertension). Additional health benefits may include reducing the risk of type 2 diabetes mellitus, heart disease, and stroke. The DASH eating plan may also help with weight loss. WHAT DO I NEED TO KNOW ABOUT THE DASH EATING PLAN? For the DASH eating plan, you will follow these general guidelines:  Choose foods with a percent daily value for sodium of less than 5% (as listed on the food label).  Use salt-free seasonings or herbs instead of table salt or sea salt.  Check with your health care provider or pharmacist before using salt substitutes.  Eat lower-sodium products, often labeled as "lower sodium" or "no salt added."  Eat fresh foods.  Eat more vegetables, fruits, and low-fat dairy products.  Choose whole grains. Look for the word "whole" as the first word in the ingredient list.  Choose fish and skinless chicken or turkey more often than red meat. Limit fish, poultry, and meat to 6 oz (170 g) each day.  Limit sweets, desserts, sugars, and sugary drinks.  Choose heart-healthy fats.  Limit cheese to 1 oz (28 g) per day.  Eat more home-cooked food and less restaurant, buffet, and fast food.  Limit fried foods.  Cook foods using methods other than frying.  Limit canned vegetables. If you do use them, rinse them well to decrease the sodium.  When eating at a restaurant, ask that your food be prepared with less salt, or no salt if possible. WHAT FOODS CAN I EAT? Seek help from a dietitian for individual  calorie needs. Grains Whole grain or whole wheat bread. Brown rice. Whole grain or whole wheat pasta. Quinoa, bulgur, and whole grain cereals. Low-sodium cereals. Corn or whole wheat flour tortillas. Whole grain cornbread. Whole grain crackers. Low-sodium crackers. Vegetables Fresh or frozen vegetables (raw, steamed, roasted, or grilled). Low-sodium or reduced-sodium tomato and vegetable juices. Low-sodium or reduced-sodium tomato sauce and paste. Low-sodium or reduced-sodium canned vegetables.  Fruits All fresh, canned (in natural juice), or frozen fruits. Meat and Other Protein Products Ground beef (85% or leaner), grass-fed beef, or beef trimmed of fat. Skinless chicken or turkey. Ground chicken or turkey. Pork trimmed of fat. All fish and seafood. Eggs. Dried beans, peas, or lentils. Unsalted nuts and seeds. Unsalted canned beans. Dairy Low-fat dairy products, such as skim or 1% milk, 2% or reduced-fat cheeses, low-fat ricotta or cottage cheese, or plain low-fat yogurt. Low-sodium or reduced-sodium cheeses. Fats and Oils Tub margarines without trans fats. Light or reduced-fat mayonnaise and salad dressings (reduced sodium). Avocado. Safflower, olive, or canola oils. Natural peanut or almond butter. Other Unsalted popcorn and pretzels. The items listed above may not be a complete list of recommended foods or beverages. Contact your dietitian for more options. WHAT FOODS ARE NOT RECOMMENDED? Grains White bread.   White pasta. White rice. Refined cornbread. Bagels and croissants. Crackers that contain trans fat. Vegetables Creamed or fried vegetables. Vegetables in a cheese sauce. Regular canned vegetables. Regular canned tomato sauce and paste. Regular tomato and vegetable juices. Fruits Dried fruits. Canned fruit in light or heavy syrup. Fruit juice. Meat and Other Protein Products Fatty cuts of meat. Ribs, chicken wings, bacon, sausage, bologna, salami, chitterlings, fatback, hot dogs,  bratwurst, and packaged luncheon meats. Salted nuts and seeds. Canned beans with salt. Dairy Whole or 2% milk, cream, half-and-half, and cream cheese. Whole-fat or sweetened yogurt. Full-fat cheeses or blue cheese. Nondairy creamers and whipped toppings. Processed cheese, cheese spreads, or cheese curds. Condiments Onion and garlic salt, seasoned salt, table salt, and sea salt. Canned and packaged gravies. Worcestershire sauce. Tartar sauce. Barbecue sauce. Teriyaki sauce. Soy sauce, including reduced sodium. Steak sauce. Fish sauce. Oyster sauce. Cocktail sauce. Horseradish. Ketchup and mustard. Meat flavorings and tenderizers. Bouillon cubes. Hot sauce. Tabasco sauce. Marinades. Taco seasonings. Relishes. Fats and Oils Butter, stick margarine, lard, shortening, ghee, and bacon fat. Coconut, palm kernel, or palm oils. Regular salad dressings. Other Pickles and olives. Salted popcorn and pretzels. The items listed above may not be a complete list of foods and beverages to avoid. Contact your dietitian for more information. WHERE CAN I FIND MORE INFORMATION? National Heart, Lung, and Blood Institute: www.nhlbi.nih.gov/health/health-topics/topics/dash/ Document Released: 06/02/2011 Document Revised: 10/28/2013 Document Reviewed: 04/17/2013 ExitCare Patient Information 2015 ExitCare, [employer]. This information is not intended to replace advice given to you by your health care provider. Make sure you discuss any questions you have with your health care provider.  

## 2014-06-02 ENCOUNTER — Encounter: Payer: Self-pay | Admitting: Family Medicine

## 2014-06-02 DIAGNOSIS — N898 Other specified noninflammatory disorders of vagina: Secondary | ICD-10-CM | POA: Insufficient documentation

## 2014-06-16 ENCOUNTER — Other Ambulatory Visit: Payer: Self-pay

## 2014-06-16 MED ORDER — IPRATROPIUM BROMIDE HFA 17 MCG/ACT IN AERS: 2.0000 | INHALATION_SPRAY | Freq: Three times a day (TID) | RESPIRATORY_TRACT | Status: DC | PRN

## 2014-06-16 NOTE — Telephone Encounter (Signed)
Refilled rx request via e-script. Thanks!

## 2014-07-01 ENCOUNTER — Ambulatory Visit (INDEPENDENT_AMBULATORY_CARE_PROVIDER_SITE_OTHER): Payer: Medicare PPO | Admitting: Family Medicine

## 2014-07-01 ENCOUNTER — Encounter: Payer: Self-pay | Admitting: Family Medicine

## 2014-07-01 VITALS — BP 139/72 | HR 98 | Temp 98.0°F | Resp 16 | Ht <= 58 in | Wt 117.0 lb

## 2014-07-01 DIAGNOSIS — R059 Cough, unspecified: Secondary | ICD-10-CM

## 2014-07-01 DIAGNOSIS — R05 Cough: Secondary | ICD-10-CM

## 2014-07-01 DIAGNOSIS — J029 Acute pharyngitis, unspecified: Secondary | ICD-10-CM

## 2014-07-01 DIAGNOSIS — R067 Sneezing: Secondary | ICD-10-CM

## 2014-07-01 DIAGNOSIS — I10 Essential (primary) hypertension: Secondary | ICD-10-CM

## 2014-07-01 DIAGNOSIS — J069 Acute upper respiratory infection, unspecified: Secondary | ICD-10-CM

## 2014-07-01 MED ORDER — AZITHROMYCIN 250 MG PO TABS: ORAL_TABLET | ORAL | Status: DC

## 2014-07-01 NOTE — Progress Notes (Signed)
Subjective:    Patient ID: Sarah Valenzuela, female    DOB: 09-03-1931, 81 y.o.   MRN: [id number]  HPI  Patient complains of upper respiratory symptoms. Symptoms include congestion, cough, sore throat and swollen glands. Onset of symptoms was 1 week ago, since that time symptoms have not improved. She also c/o post nasal drip for the past week .  She is drinking moderate amounts of fluids. Treatment to date has included Tylenol Sinus and Alka-selzer Cold Plus, last taken on yesterday with minimal relief.    Past Medical History  Diagnosis Date  . Asthma   . Hypercholesteremia   . Hypertension   . Hyperlipidemia   . Situational stress     Review of Systems  Constitutional: Negative.  Negative for fever, fatigue and unexpected weight change.  HENT: Positive for postnasal drip, sinus pressure and sore throat. Negative for ear pain.   Eyes: Negative.   Respiratory: Positive for cough. Negative for shortness of breath and wheezing.   Cardiovascular: Negative.   Gastrointestinal: Negative.   Endocrine: Negative.   Genitourinary: Negative.   Musculoskeletal: Negative.   Skin: Negative.   Allergic/Immunologic: Negative.   Neurological: Negative.   Hematological: Negative.   Psychiatric/Behavioral: Negative.        Objective:   Physical Exam  Constitutional: She is oriented to person, place, and time. She appears well-developed and well-nourished.  HENT:  Head: Normocephalic and atraumatic.  Right Ear: Hearing, tympanic membrane, external ear and ear canal normal.  Left Ear: Hearing, tympanic membrane, external ear and ear canal normal.  Nose: Nose normal.  Mouth/Throat: Posterior oropharyngeal erythema present.  Eyes: Conjunctivae are normal. Pupils are equal, round, and reactive to light.  Neck: Normal range of motion. Neck supple.  Cardiovascular: Normal rate, regular rhythm, normal heart sounds and intact distal pulses.   Pulmonary/Chest: Effort normal. No apnea, no  tachypnea and no bradypnea. She has no decreased breath sounds. She has rhonchi in the right upper field and the left upper field.  Abdominal: Soft. Normal appearance and bowel sounds are normal.  Musculoskeletal: Normal range of motion.  Neurological: She is alert and oriented to person, place, and time. She has normal reflexes.  Skin: Skin is warm and dry.  Psychiatric: She has a normal mood and affect. Her behavior is normal. Judgment and thought content normal. Cognition and memory are normal.         BP 139/72 mmHg  Pulse 98  Temp(Src) 98 F (36.7 C)  Resp 16  Ht 4\' 10"  (1.473 m)  Wt 117 lb (53.071 kg)  BMI 24.46 kg/m2 Assessment & Plan:  1. Acute upper respiratory infection Ms. Gheen presents with upper respiratory symptoms. She has been taking OTC medications for the past 5 days with minimal relief. I recommend for her to discontinue OTC medications due to age and medical history. I will start azithrymycin for 5 days. Also, advised to increase rest and handwashing.  - azithromycin (ZITHROMAX) 250 MG tablet; Day 1; 500 mg Days 2-5; 250 mg  Dispense: 6 tablet; Refill: 0  2. Cough  - azithromycin (ZITHROMAX) 250 MG tablet; Day 1; 500 mg Days 2-5; 250 mg  Dispense: 6 tablet; Refill: 0  3. Sneezing Continue Cetirizine 10 mg daily as previously prescribed for sneezing and nasal congestion.  4. Sore throat She is currently gargling with salt water as needed. I recommend that she continue    6. Essential hypertension Blood pressure is controlled on current medication regimen  Oneill Bais M,  FNP

## 2014-07-01 NOTE — Patient Instructions (Addendum)
Delsym cough relief every 12 hours as needed Increase rest and handwashing Upper Respiratory Infection, Adult An upper respiratory infection (URI) is also sometimes known as the common cold. The upper respiratory tract includes the nose, sinuses, throat, trachea, and bronchi. Bronchi are the airways leading to the lungs. Most people improve within 1 week, but symptoms can last up to 2 weeks. A residual cough may last even longer.  CAUSES Many different viruses can infect the tissues lining the upper respiratory tract. The tissues become irritated and inflamed and often become very moist. Mucus production is also common. A cold is contagious. You can easily spread the virus to others by oral contact. This includes kissing, sharing a glass, coughing, or sneezing. Touching your mouth or nose and then touching a surface, which is then touched by another person, can also spread the virus. SYMPTOMS  Symptoms typically develop 1 to 3 days after you come in contact with a cold virus. Symptoms vary from person to person. They may include:  Runny nose.  Sneezing.  Nasal congestion.  Sinus irritation.  Sore throat.  Loss of voice (laryngitis).  Cough.  Fatigue.  Muscle aches.  Loss of appetite.  Headache.  Low-grade fever. DIAGNOSIS  You might diagnose your own cold based on familiar symptoms, since most people get a cold 2 to 3 times a year. Your caregiver can confirm this based on your exam. Most importantly, your caregiver can check that your symptoms are not due to another disease such as strep throat, sinusitis, pneumonia, asthma, or epiglottitis. Blood tests, throat tests, and X-rays are not necessary to diagnose a common cold, but they may sometimes be helpful in excluding other more serious diseases. Your caregiver will decide if any further tests are required. RISKS AND COMPLICATIONS  You may be at risk for a more severe case of the common cold if you smoke cigarettes, have chronic  heart disease (such as heart failure) or lung disease (such as asthma), or if you have a weakened immune system. The very young and very old are also at risk for more serious infections. Bacterial sinusitis, middle ear infections, and bacterial pneumonia can complicate the common cold. The common cold can worsen asthma and chronic obstructive pulmonary disease (COPD). Sometimes, these complications can require emergency medical care and may be life-threatening. PREVENTION  The best way to protect against getting a cold is to practice good hygiene. Avoid oral or hand contact with people with cold symptoms. Wash your hands often if contact occurs. There is no clear evidence that vitamin C, vitamin E, echinacea, or exercise reduces the chance of developing a cold. However, it is always recommended to get plenty of rest and practice good nutrition. TREATMENT  Treatment is directed at relieving symptoms. There is no cure. Antibiotics are not effective, because the infection is caused by a virus, not by bacteria. Treatment may include:  Increased fluid intake. Sports drinks offer valuable electrolytes, sugars, and fluids.  Breathing heated mist or steam (vaporizer or shower).  Eating chicken soup or other clear broths, and maintaining good nutrition.  Getting plenty of rest.  Using gargles or lozenges for comfort.  Controlling fevers with ibuprofen or acetaminophen as directed by your caregiver.  Increasing usage of your inhaler if you have asthma. Zinc gel and zinc lozenges, taken in the first 24 hours of the common cold, can shorten the duration and lessen the severity of symptoms. Pain medicines may help with fever, muscle aches, and throat pain. A variety of  non-prescription medicines are available to treat congestion and runny nose. Your caregiver can make recommendations and may suggest nasal or lung inhalers for other symptoms.  HOME CARE INSTRUCTIONS   Only take over-the-counter or  prescription medicines for pain, discomfort, or fever as directed by your caregiver.  Use a warm mist humidifier or inhale steam from a shower to increase air moisture. This may keep secretions moist and make it easier to breathe.  Drink enough water and fluids to keep your urine clear or pale yellow.  Rest as needed.  Return to work when your temperature has returned to normal or as your caregiver advises. You may need to stay home longer to avoid infecting others. You can also use a face mask and careful hand washing to prevent spread of the virus. SEEK MEDICAL CARE IF:   After the first few days, you feel you are getting worse rather than better.  You need your caregiver's advice about medicines to control symptoms.  You develop chills, worsening shortness of breath, or brown or red sputum. These may be signs of pneumonia.  You develop yellow or brown nasal discharge or pain in the face, especially when you bend forward. These may be signs of sinusitis.  You develop a fever, swollen neck glands, pain with swallowing, or white areas in the back of your throat. These may be signs of strep throat. SEEK IMMEDIATE MEDICAL CARE IF:   You have a fever.  You develop severe or persistent headache, ear pain, sinus pain, or chest pain.  You develop wheezing, a prolonged cough, cough up blood, or have a change in your usual mucus (if you have chronic lung disease).  You develop sore muscles or a stiff neck. Document Released: 12/07/2000 Document Revised: 09/05/2011 Document Reviewed: 09/18/2013 [facility] CenterExit[facility] Patient Information 2015 [place], [place]. This information is not intended to replace advice given to you by your health care provider. Make sure you discuss any questions you have with your health care provider.

## 2014-07-21 ENCOUNTER — Other Ambulatory Visit: Payer: Self-pay | Admitting: Internal Medicine

## 2014-07-21 MED ORDER — TRIAMTERENE-HCTZ 37.5-25 MG PO TABS: 0.5000 | ORAL_TABLET | Freq: Every day | ORAL | Status: DC

## 2014-07-21 NOTE — Telephone Encounter (Signed)
Refill sent in via e-script. Thanks!  

## 2014-08-12 ENCOUNTER — Telehealth: Payer: Self-pay | Admitting: Family Medicine

## 2014-08-20 ENCOUNTER — Encounter: Payer: Self-pay | Admitting: Family Medicine

## 2014-08-20 ENCOUNTER — Other Ambulatory Visit: Payer: Self-pay | Admitting: Family Medicine

## 2014-08-20 ENCOUNTER — Ambulatory Visit (INDEPENDENT_AMBULATORY_CARE_PROVIDER_SITE_OTHER): Payer: Medicare PPO | Admitting: Family Medicine

## 2014-08-20 VITALS — BP 134/64 | HR 86 | Temp 97.9°F | Resp 16 | Ht <= 58 in | Wt 117.0 lb

## 2014-08-20 DIAGNOSIS — N898 Other specified noninflammatory disorders of vagina: Secondary | ICD-10-CM

## 2014-08-20 DIAGNOSIS — Z0000 Encounter for general adult medical examination without abnormal findings: Secondary | ICD-10-CM

## 2014-08-20 DIAGNOSIS — Z23 Encounter for immunization: Secondary | ICD-10-CM

## 2014-08-20 MED ORDER — REPLENS VA GEL: 1.0000 "application " | Freq: Every day | VAGINAL | Status: DC | PRN

## 2014-08-20 NOTE — Patient Instructions (Addendum)
Pneumococcal Vaccine, Polyvalent suspension for injection What is this medicine? PNEUMOCOCCAL VACCINE, POLYVALENT (NEU mo KOK al vak SEEN, pol ee VEY luhnt) is a vaccine to prevent pneumococcus bacteria infection. These bacteria are a major cause of ear infections, 'Strep throat' infections, and serious pneumonia, meningitis, or blood infections worldwide. These vaccines help the body to produce antibodies (protective substances) that help your body defend against these bacteria. This vaccine is recommended for infants and young children. This vaccine will not treat an infection. This medicine may be used for other purposes; ask your health care provider or pharmacist if you have questions. COMMON BRAND NAME(S): Prevnar 13 What should I tell my health care provider before I take this medicine? They need to know if you have any of these conditions: -bleeding problems -fever -immune system problems -low platelet count in the blood -seizures -an unusual or allergic reaction to pneumococcal vaccine, diphtheria toxoid, other vaccines, latex, other medicines, foods, dyes, or preservatives -pregnant or trying to get pregnant -breast-feeding How should I use this medicine? This vaccine is for injection into a muscle. It is given by a health care professional. A copy of Vaccine Information Statements will be given before each vaccination. Read this sheet carefully each time. The sheet may change frequently. Talk to your pediatrician regarding the use of this medicine in children. While this drug may be prescribed for children as young as 38 weeks old for selected conditions, precautions do apply. Overdosage: If you think you have taken too much of this medicine contact a poison control center or emergency room at once. NOTE: This medicine is only for you. Do not share this medicine with others. What if I miss a dose? It is important not to miss your dose. Call your doctor or health care professional if  you are unable to keep an appointment. What may interact with this medicine? -medicines for cancer chemotherapy -medicines that suppress your immune function -medicines that treat or prevent blood clots like warfarin, enoxaparin, and dalteparin -steroid medicines like prednisone or cortisone This list may not describe all possible interactions. Give your health care provider a list of all the medicines, herbs, non-prescription drugs, or dietary supplements you use. Also tell them if you smoke, drink alcohol, or use illegal drugs. Some items may interact with your medicine. What should I watch for while using this medicine? Mild fever and pain should go away in 3 days or less. Report any unusual symptoms to your doctor or health care professional. What side effects may I notice from receiving this medicine? Side effects that you should report to your doctor or health care professional as soon as possible: -allergic reactions like skin rash, itching or hives, swelling of the face, lips, or tongue -breathing problems -confused -fever over 102 degrees F -pain, tingling, numbness in the hands or feet -seizures -unusual bleeding or bruising -unusual muscle weakness Side effects that usually do not require medical attention (report to your doctor or health care professional if they continue or are bothersome): -aches and pains -diarrhea -fever of 102 degrees F or less -headache -irritable -loss of appetite -pain, tender at site where injected -trouble sleeping This list may not describe all possible side effects. Call your doctor for medical advice about side effects. You may report side effects to FDA at 1-800-FDA-1088. Where should I keep my medicine? This does not apply. This vaccine is given in a clinic, pharmacy, doctor's office, or other health care setting and will not be stored at home. NOTE:  This sheet is a summary. It may not cover all possible information. If you have questions  about this medicine, talk to your doctor, pharmacist, or health care provider.  2015, [employer]. (2008-08-26 10:17:22) Health Maintenance Adopting a healthy lifestyle and getting preventive care can go a long way to promote health and wellness. Talk with your health care provider about what schedule of regular examinations is right for you. This is a good chance for you to check in with your provider about disease prevention and staying healthy. In between checkups, there are plenty of things you can do on your own. Experts have done a lot of research about which lifestyle changes and preventive measures are most likely to keep you healthy. Ask your health care provider for more information. WEIGHT AND DIET  Eat a healthy diet  Be sure to include plenty of vegetables, fruits, low-fat dairy products, and lean protein.  Do not eat a lot of foods high in solid fats, added sugars, or salt.  Get regular exercise. This is one of the most important things you can do for your health.  Most adults should exercise for at least 150 minutes each week. The exercise should increase your heart rate and make you sweat (moderate-intensity exercise).  Most adults should also do strengthening exercises at least twice a week. This is in addition to the moderate-intensity exercise.  Maintain a healthy weight  Body mass index (BMI) is a measurement that can be used to identify possible weight problems. It estimates body fat based on height and weight. Your health care provider can help determine your BMI and help you achieve or maintain a healthy weight.  For females 68 years of age and older:   A BMI below 18.5 is considered underweight.  A BMI of 18.5 to 24.9 is normal.  A BMI of 25 to 29.9 is considered overweight.  A BMI of 30 and above is considered obese.  Watch levels of cholesterol and blood lipids  You should start having your blood tested for lipids and cholesterol at 79 years of  age, then have this test every 5 years.  You may need to have your cholesterol levels checked more often if:  Your lipid or cholesterol levels are high.  You are older than 79 years of age.  You are at high risk for heart disease.  CANCER SCREENING   Lung Cancer  Lung cancer screening is recommended for adults 57-54 years old who are at high risk for lung cancer because of a history of smoking.  A yearly low-dose CT scan of the lungs is recommended for people who:  Currently smoke.  Have quit within the past 15 years.  Have at least a 30-pack-year history of smoking. A pack year is smoking an average of one pack of cigarettes a day for 1 year.  Yearly screening should continue until it has been 15 years since you quit.  Yearly screening should stop if you develop a health problem that would prevent you from having lung cancer treatment.  Breast Cancer  Practice breast self-awareness. This means understanding how your breasts normally appear and feel.  It also means doing regular breast self-exams. Let your health care provider know about any changes, no matter how small.  If you are in your 20s or 30s, you should have a clinical breast exam (CBE) by a health care provider every 1-3 years as part of a regular health exam.  If you are 35 or older, have a  CBE every year. Also consider having a breast X-ray (mammogram) every year.  If you have a family history of breast cancer, talk to your health care provider about genetic screening.  If you are at high risk for breast cancer, talk to your health care provider about having an MRI and a mammogram every year.  Breast cancer gene (BRCA) assessment is recommended for women who have family members with BRCA-related cancers. BRCA-related cancers include:  Breast.  Ovarian.  Tubal.  Peritoneal cancers.  Results of the assessment will determine the need for genetic counseling and BRCA1 and BRCA2 testing. Cervical  Cancer Routine pelvic examinations to screen for cervical cancer are no longer recommended for nonpregnant women who are considered low risk for cancer of the pelvic organs (ovaries, uterus, and vagina) and who do not have symptoms. A pelvic examination may be necessary if you have symptoms including those associated with pelvic infections. Ask your health care provider if a screening pelvic exam is right for you.   The Pap test is the screening test for cervical cancer for women who are considered at risk.  If you had a hysterectomy for a problem that was not cancer or a condition that could lead to cancer, then you no longer need Pap tests.  If you are older than 65 years, and you have had normal Pap tests for the past 10 years, you no longer need to have Pap tests.  If you have had past treatment for cervical cancer or a condition that could lead to cancer, you need Pap tests and screening for cancer for at least 20 years after your treatment.  If you no longer get a Pap test, assess your risk factors if they change (such as having a new sexual partner). This can affect whether you should start being screened again.  Some women have medical problems that increase their chance of getting cervical cancer. If this is the case for you, your health care provider may recommend more frequent screening and Pap tests.  The human papillomavirus (HPV) test is another test that may be used for cervical cancer screening. The HPV test looks for the virus that can cause cell changes in the cervix. The cells collected during the Pap test can be tested for HPV.  The HPV test can be used to screen women 46 years of age and older. Getting tested for HPV can extend the interval between normal Pap tests from three to five years.  An HPV test also should be used to screen women of any age who have unclear Pap test results.  After 79 years of age, women should have HPV testing as often as Pap tests.  Colorectal  Cancer  This type of cancer can be detected and often prevented.  Routine colorectal cancer screening usually begins at 79 years of age and continues through 79 years of age.  Your health care provider may recommend screening at an earlier age if you have risk factors for colon cancer.  Your health care provider may also recommend using home test kits to check for hidden blood in the stool.  A small camera at the end of a tube can be used to examine your colon directly (sigmoidoscopy or colonoscopy). This is done to check for the earliest forms of colorectal cancer.  Routine screening usually begins at age 45.  Direct examination of the colon should be repeated every 5-10 years through 79 years of age. However, you may need to be screened more often  if early forms of precancerous polyps or small growths are found. Skin Cancer  Check your skin from head to toe regularly.  Tell your health care provider about any new moles or changes in moles, especially if there is a change in a mole's shape or color.  Also tell your health care provider if you have a mole that is larger than the size of a pencil eraser.  Always use sunscreen. Apply sunscreen liberally and repeatedly throughout the day.  Protect yourself by wearing long sleeves, pants, a wide-brimmed hat, and sunglasses whenever you are outside. HEART DISEASE, DIABETES, AND HIGH BLOOD PRESSURE   Have your blood pressure checked at least every 1-2 years. High blood pressure causes heart disease and increases the risk of stroke.  If you are between 5 years and 92 years old, ask your health care provider if you should take aspirin to prevent strokes.  Have regular diabetes screenings. This involves taking a blood sample to check your fasting blood sugar level.  If you are at a normal weight and have a low risk for diabetes, have this test once every three years after 79 years of age.  If you are overweight and have a high risk for  diabetes, consider being tested at a younger age or more often. PREVENTING INFECTION  Hepatitis B  If you have a higher risk for hepatitis B, you should be screened for this virus. You are considered at high risk for hepatitis B if:  You were born in a country where hepatitis B is common. Ask your health care provider which countries are considered high risk.  Your parents were born in a high-risk country, and you have not been immunized against hepatitis B (hepatitis B vaccine).  You have HIV or AIDS.  You use needles to inject street drugs.  You live with someone who has hepatitis B.  You have had sex with someone who has hepatitis B.  You get hemodialysis treatment.  You take certain medicines for conditions, including cancer, organ transplantation, and autoimmune conditions. Hepatitis C  Blood testing is recommended for:  Everyone born from [birth year] through 1963.  Anyone with known risk factors for hepatitis C. Sexually transmitted infections (STIs)  You should be screened for sexually transmitted infections (STIs) including gonorrhea and chlamydia if:  You are sexually active and are younger than 79 years of age.  You are older than 79 years of age and your health care provider tells you that you are at risk for this type of infection.  Your sexual activity has changed since you were last screened and you are at an increased risk for chlamydia or gonorrhea. Ask your health care provider if you are at risk.  If you do not have HIV, but are at risk, it may be recommended that you take a prescription medicine daily to prevent HIV infection. This is called pre-exposure prophylaxis (PrEP). You are considered at risk if:  You are sexually active and do not regularly use condoms or know the HIV status of your partner(s).  You take drugs by injection.  You are sexually active with a partner who has HIV. Talk with your health care provider about whether you are at high risk of  being infected with HIV. If you choose to begin PrEP, you should first be tested for HIV. You should then be tested every 3 months for as long as you are taking PrEP.  PREGNANCY   If you are premenopausal and you may become pregnant,  ask your health care provider about preconception counseling.  If you may become pregnant, take 400 to 800 micrograms (mcg) of folic acid every day.  If you want to prevent pregnancy, talk to your health care provider about birth control (contraception). OSTEOPOROSIS AND MENOPAUSE   Osteoporosis is a disease in which the bones lose minerals and strength with aging. This can result in serious bone fractures. Your risk for osteoporosis can be identified using a bone density scan.  If you are 57 years of age or older, or if you are at risk for osteoporosis and fractures, ask your health care provider if you should be screened.  Ask your health care provider whether you should take a calcium or vitamin D supplement to lower your risk for osteoporosis.  Menopause may have certain physical symptoms and risks.  Hormone replacement therapy may reduce some of these symptoms and risks. Talk to your health care provider about whether hormone replacement therapy is right for you.  HOME CARE INSTRUCTIONS   Schedule regular health, dental, and eye exams.  Stay current with your immunizations.   Do not use any tobacco products including cigarettes, chewing tobacco, or electronic cigarettes.  If you are pregnant, do not drink alcohol.  If you are breastfeeding, limit how much and how often you drink alcohol.  Limit alcohol intake to no more than 1 drink per day for nonpregnant women. One drink equals 12 ounces of beer, 5 ounces of wine, or 1 ounces of hard liquor.  Do not use street drugs.  Do not share needles.  Ask your health care provider for help if you need support or information about quitting drugs.  Tell your health care provider if you often feel  depressed.  Tell your health care provider if you have ever been abused or do not feel safe at home. Document Released: 12/27/2010 Document Revised: 10/28/2013 Document Reviewed: 05/15/2013 [facility] Patient Information 2015 [place], [place]. This information is not intended to replace advice given to you by your health care provider. Make sure you discuss any questions you have with your health care provider.

## 2014-08-20 NOTE — Progress Notes (Signed)
ANNUAL PREVENTATIVE VISIT AND CPE  Subjective:  Sarah Valenzuela is a 81 y.o. female who presents for Annual Wellness Visit and complete physical.  Date of last wellness visit is unknown.  She has had hypertension for several years. Her blood pressure has been controlled at home, today their BP is BP: 134/64 mmHg She does workout. She denies chest pain, shortness of breath, dizziness.  She is on cholesterol medication and denies myalgias. Her cholesterol is at goal. The cholesterol last visit was:   Lab Results  Component Value Date   CHOL 158 05/07/2014   HDL 89 05/07/2014   LDLCALC 61 05/07/2014   TRIG 41 05/07/2014   CHOLHDL 1.8 05/07/2014    Lab Results  Component Value Date   VD25OH 52 05/07/2014       Names of Other Physician/Practitioners you currently use: 1. [facility] here for primary care Patient Care Team: Altha Harm, MD as PCP - General (Internal Medicine)   Medication Review: Current Outpatient Prescriptions on File Prior to Visit  Medication Sig Dispense Refill  . acetaminophen (TYLENOL) 500 MG tablet Take 1 tablet (500 mg total) by mouth every 6 (six) hours as needed for pain. 30 tablet 0  . ALPRAZolam (XANAX) 0.25 MG tablet Take 1 tablet (0.25 mg total) by mouth at bedtime as needed for anxiety. (Patient not taking: Reported on 07/01/2014) 30 tablet 0  . aspirin 81 MG chewable tablet Chew 81 mg by mouth daily.    Marland Kitchen azithromycin (ZITHROMAX) 250 MG tablet Day 1; 500 mg Days 2-5; 250 mg 6 tablet 0  . calcium citrate (CALCITRATE - DOSED IN MG ELEMENTAL CALCIUM) 950 MG tablet Take 1 tablet by mouth 2 (two) times daily.    . cetirizine (ZYRTEC) 10 MG tablet Take 10 mg by mouth daily.    Marland Kitchen conjugated estrogens (PREMARIN) vaginal cream Place vaginally 3 (three) times a week.    . cycloSPORINE (RESTASIS) 0.05 % ophthalmic emulsion Place 1 drop into both eyes 2 (two) times daily.    . Fluticasone-Salmeterol (ADVAIR) 100-50 MCG/DOSE AEPB Inhale 1  puff into the lungs every 12 (twelve) hours.    Marland Kitchen ipratropium (ATROVENT HFA) 17 MCG/ACT inhaler Inhale 2 puffs into the lungs 3 (three) times daily as needed. For SOB 1 Inhaler 3  . mometasone (NASONEX) 50 MCG/ACT nasal spray Place 2 sprays into the nose daily. 17 g 2  . rosuvastatin (CRESTOR) 5 MG tablet Take 5 mg by mouth daily.    . SENNOSIDES PO Take 8.5 mg by mouth as needed (Swiss Kriss OTC Laxative).    . triamterene-hydrochlorothiazide (MAXZIDE-25) 37.5-25 MG per tablet Take 0.5 tablets by mouth daily. 30 tablet 2  . Vaginal Moisturizer (VAGISIL FEMININE MOISTURIZER) LOTN Place vaginally 3 (three) times daily.    . zafirlukast (ACCOLATE) 20 MG tablet Take 1 tablet (20 mg total) by mouth 2 (two) times daily. 60 tablet 5   No current facility-administered medications on file prior to visit.    Current Problems (verified) Patient Active Problem List   Diagnosis Date Noted  . Vaginal dryness 06/02/2014  . Situational depression 05/30/2014  . Anxiety state 05/30/2014  . Vaginal itching 11/14/2012  . Occasional tremors 11/14/2012  . HTN (hypertension) 11/14/2012  . Hyperlipidemia 11/14/2012  . Adjustment disorder with mixed anxiety and depressed mood 11/14/2012  . Seasonal allergies 11/14/2012    Screening Tests Health Maintenance  Topic Date Due  . COLONOSCOPY  01/09/1984  . ZOSTAVAX  01/08/1994  . DEXA  SCAN  01/09/1999  . INFLUENZA VACCINE  01/26/2015  . TETANUS/TDAP  08/22/2023    Immunization History  Administered Date(s) Administered  . Influenza Whole 04/17/2012  . Influenza,inj,Quad PF,36+ Mos 04/01/2013, 03/24/2014  . Tdap 08/21/2013    Preventative care: Last colonoscopy: With Dr. Loreta Ave Last mammogram: 10/2013 Last pap smear/pelvic exam: N/a DEXA: Bone density test; last 07/2023  Prior vaccinations: TD or Tdap: 07/2013  Influenza: 02/2014 Pneumococcal: 2013 Prevnar13: 08/20/2014 Shingles/Zostavax: 2013 This medication is not covered by your insurance if  dispensed from your Physician's office. You can obtain your vaccine at an area Pharmacy.    Medication List       This list is accurate as of: 08/20/14  1:37 PM.  Always use your most recent med list.               acetaminophen 500 MG tablet  Commonly known as:  TYLENOL  Take 1 tablet (500 mg total) by mouth every 6 (six) hours as needed for pain.     ALPRAZolam 0.25 MG tablet  Commonly known as:  XANAX  Take 1 tablet (0.25 mg total) by mouth at bedtime as needed for anxiety.     aspirin 81 MG chewable tablet  Chew 81 mg by mouth daily.     azithromycin 250 MG tablet  Commonly known as:  ZITHROMAX  Day 1; 500 mg Days 2-5; 250 mg     calcium citrate 950 MG tablet  Commonly known as:  CALCITRATE - dosed in mg elemental calcium  Take 1 tablet by mouth 2 (two) times daily.     cetirizine 10 MG tablet  Commonly known as:  ZYRTEC  Take 10 mg by mouth daily.     conjugated estrogens vaginal cream  Commonly known as:  PREMARIN  Place vaginally 3 (three) times a week.     cycloSPORINE 0.05 % ophthalmic emulsion  Commonly known as:  RESTASIS  Place 1 drop into both eyes 2 (two) times daily.     Fluticasone-Salmeterol 100-50 MCG/DOSE Aepb  Commonly known as:  ADVAIR  Inhale 1 puff into the lungs every 12 (twelve) hours.     ipratropium 17 MCG/ACT inhaler  Commonly known as:  ATROVENT HFA  Inhale 2 puffs into the lungs 3 (three) times daily as needed. For SOB     mometasone 50 MCG/ACT nasal spray  Commonly known as:  NASONEX  Place 2 sprays into the nose daily.     rosuvastatin 5 MG tablet  Commonly known as:  CRESTOR  Take 5 mg by mouth daily.     SENNOSIDES PO  Take 8.5 mg by mouth as needed (Swiss Kriss OTC Laxative).     triamterene-hydrochlorothiazide 37.5-25 MG per tablet  Commonly known as:  MAXZIDE-25  Take 0.5 tablets by mouth daily.     VAGISIL FEMININE MOISTURIZER Lotn  Place vaginally 3 (three) times daily.     zafirlukast 20 MG tablet  Commonly  known as:  ACCOLATE  Take 1 tablet (20 mg total) by mouth 2 (two) times daily.        No past surgical history on file. Family History  Problem Relation Age of Onset  . Cancer Brother     Prostate    History reviewed: allergies, current medications, past family history, past medical history, past social history, past surgical history and problem list   Risk Factors: Osteoporosis/FallRisk:  In the past year have you fallen or had a near fall?No History of fracture in the past  year: No  Tobacco History  Substance Use Topics  . Smoking status: Never Smoker   . Smokeless tobacco: Not on file  . Alcohol Use: No   She does not smoke.  Are there smokers in your home (other than you)?  No  Alcohol Current alcohol use: none  Caffeine Current caffeine use: tea 1 /day  Exercise Current exercise: aerobics  Nutrition/Diet Current diet: in general, a "healthy" diet  , low fat/ cholesterol, low salt  Cardiac risk factors: advanced age (older than 52 for men, 55 for women) and dyslipidemia.  Depression Screen (Note: if answer to either of the following is "Yes", a more complete depression screening is indicated)  Over the past two weeks, have you felt down, depressed or hopeless? No  Over the past two weeks, have you felt little interest or pleasure in doing things? No Have you lost interest or pleasure in daily life? No Do you often feel hopeless? No  Do you cry easily over simple problems? No  Activities of Daily Living In your present state of health, do you have any difficulty performing the following activities?:  Driving?No Managing money? No Feeding yourself? No Getting from bed to chair? No Climbing a flight of stairs? No Preparing food and eating?: No Bathing or showering? No Getting dressed: No Getting to the toilet? No Using the toilet:No Moving around from place to place: No In the past year have you fallen or had a near fall?:No  Are you sexually  active? No   Vision Difficulties: No Hearing Difficulties:  Do you often ask people to speak up or repeat themselves?No Do you experience ringing or noises in your ears?No Do you have difficulty understanding soft or whispered voices?No { Cognition  Do you feel that you have a problem with memory? No  Do you often misplace items?No  Do you feel safe at home?  Yes  Advanced directives Does patient have a Health Care Power of Attorney? Yes, daughter; Janace Aris Does patient have a Living Will? Yes Cognitive Testing  Alert? Yes  Normal Appearance?Yes  Oriented to person? Yes  Place? Yes   Time? Yes  Recall of three objects?  Yes  Can perform simple calculations? Yes  Displays appropriate judgment?Yes  Can read the correct time from a watch face?Yes  Mini-Mental Self exam: 29/30   Objective:     Blood pressure 134/64, pulse 86, temperature 97.9 F (36.6 C), temperature source Oral, resp. rate 16, height 4\' 10"  (1.473 m), weight 117 lb (53.071 kg). Body mass index is 24.46 kg/(m^2).  General appearance: alert, no distress, WD/WN, female   Assessment:  Patient denies any difficulties at home. No trouble with ADLs, depression or falls. No recent changes to vision or hearing. Is UTD with immunizations. Is UTD with screening. Discussed Advanced Directives, patient agrees to bring [place] copies of documents if can. Encouraged heart healthy diet, exercise as tolerated and adequate sleep.   Plan:   During the course of the visit the patient was educated and counseled about appropriate screening and preventive services including:    Pneumococcal vaccine   Influenza vaccine  Td vaccine  Screening electrocardiogram  Bone densitometry screening  Colorectal cancer screening  Diabetes screening   Glaucoma screening  Nutrition counseling   Advanced directives: requested  Screening recommendations, referrals: Vaccinations: Please see documentation below and orders this  visit.  Nutrition assessed and recommended  Colonoscopy ordered Recommended yearly ophthalmology/optometry visit for glaucoma screening and checkup Recommended yearly dental visit for  hygiene and checkup Advanced directives - Established  Medicare annual wellness visit, subsequent - Pneumococcal conjugate vaccine 13-valent - Ambulatory referral to Gastroenterology - DG DXA FRACTURE ASSESSMENT; Future  Conditions/risks identified: BMI: Discussed weight loss, diet, and increase physical activity.  Increase physical activity: AHA recommends 150 minutes of physical activity a week.  Medications reviewed Fall risk moderate: patient has to climb 10 stairs to reach her bedroom May 2015, mammogram  Medicare Attestation I have personally reviewed: The patient's medical and social history Their use of alcohol, tobacco or illicit drugs Their current medications and supplements The patient's functional ability including ADLs,fall risks, home safety risks, cognitive, and hearing and visual impairment Diet and physical activities Evidence for depression or mood disorders  The patient's weight, height, BMI, and visual acuity have been recorded in the chart.  I have made referrals, counseling, and provided education to the patient based on review of the above and I have provided the patient with a written personalized care plan for preventive services.     Ayane Delancey Judie PetitM, FNP   08/20/2014

## 2014-09-15 ENCOUNTER — Other Ambulatory Visit: Payer: Self-pay

## 2014-09-15 DIAGNOSIS — J302 Other seasonal allergic rhinitis: Secondary | ICD-10-CM

## 2014-09-15 MED ORDER — ZAFIRLUKAST 20 MG PO TABS: 20.0000 mg | ORAL_TABLET | Freq: Two times a day (BID) | ORAL | Status: DC

## 2014-09-15 MED ORDER — MOMETASONE FUROATE 50 MCG/ACT NA SUSP: 2.0000 | Freq: Every day | NASAL | Status: DC

## 2014-09-15 NOTE — Telephone Encounter (Signed)
Refills faxed in via e-script. Thanks!

## 2014-09-29 ENCOUNTER — Other Ambulatory Visit: Payer: Self-pay

## 2014-09-29 DIAGNOSIS — Z1231 Encounter for screening mammogram for malignant neoplasm of breast: Secondary | ICD-10-CM

## 2014-10-02 DIAGNOSIS — H40023 Open angle with borderline findings, high risk, bilateral: Secondary | ICD-10-CM | POA: Diagnosis not present

## 2014-11-05 ENCOUNTER — Ambulatory Visit
Admission: RE | Admit: 2014-11-05 | Discharge: 2014-11-05 | Disposition: A | Discharge status: Home / Self Care | Payer: Medicare PPO | Source: Ambulatory Visit

## 2014-11-05 ENCOUNTER — Ambulatory Visit: Payer: Medicare PPO

## 2014-11-05 DIAGNOSIS — Z1231 Encounter for screening mammogram for malignant neoplasm of breast: Secondary | ICD-10-CM

## 2014-12-04 ENCOUNTER — Ambulatory Visit (INDEPENDENT_AMBULATORY_CARE_PROVIDER_SITE_OTHER): Payer: Medicare PPO | Admitting: Internal Medicine

## 2014-12-04 VITALS — BP 113/60 | HR 87 | Temp 98.3°F | Resp 16 | Ht <= 58 in | Wt 118.0 lb

## 2014-12-04 DIAGNOSIS — H6123 Impacted cerumen, bilateral: Secondary | ICD-10-CM | POA: Diagnosis not present

## 2014-12-04 DIAGNOSIS — Z1211 Encounter for screening for malignant neoplasm of colon: Secondary | ICD-10-CM

## 2014-12-04 DIAGNOSIS — Z13820 Encounter for screening for osteoporosis: Secondary | ICD-10-CM

## 2014-12-04 DIAGNOSIS — I10 Essential (primary) hypertension: Secondary | ICD-10-CM

## 2014-12-04 DIAGNOSIS — Z9189 Other specified personal risk factors, not elsewhere classified: Principal | ICD-10-CM

## 2014-12-04 DIAGNOSIS — Z87898 Personal history of other specified conditions: Secondary | ICD-10-CM

## 2014-12-04 DIAGNOSIS — Z0000 Encounter for general adult medical examination without abnormal findings: Secondary | ICD-10-CM

## 2014-12-04 DIAGNOSIS — M810 Age-related osteoporosis without current pathological fracture: Secondary | ICD-10-CM

## 2014-12-04 LAB — BASIC METABOLIC PANEL
BUN: 8 mg/dL (ref 6–23)
CALCIUM: 9.5 mg/dL (ref 8.4–10.5)
CO2: 28 meq/L (ref 19–32)
CREATININE: 0.78 mg/dL (ref 0.50–1.10)
Chloride: 99 mEq/L (ref 96–112)
GLUCOSE: 78 mg/dL (ref 70–99)
POTASSIUM: 3.6 meq/L (ref 3.5–5.3)
SODIUM: 136 meq/L (ref 135–145)

## 2014-12-04 LAB — MAGNESIUM: MAGNESIUM: 1.9 mg/dL (ref 1.5–2.5)

## 2014-12-04 NOTE — Progress Notes (Signed)
Patient ID: Sarah Valenzuela, female   DOB: 15-Dec-1931, 81 y.o.   MRN: [id number]   Sarah Valenzuela, is a 81 y.o. female  [id number]  [id number]  DOB - 10/05/1931  CC:  Chief Complaint  Patient presents with  . Hypertension    headache        HPI: Sarah Valenzuela is a 81 y.o. female here today to follow up on HTN. She reports that she has had no new problems since last visit.   She reports that she still has some difficulty with vaginal dryness and uses Vagasil as she has tried estrogen creams with no success. She feels that the Vagasil is effective.   Pt reports that she was seen inn consultation with Dr. Loreta Ave for Colonoscopy and advised by Dr. Loreta Ave that she did not need the Colonoscopy due to her age of 58 years.  She reports that she occasionally has a pain in her region of her left PSIS whic only occurs with movement during her exercise class. She goes to aerobic exercise 3-5 x week. She states that she has never had a Dexa-scan. Please note that she has been referred in the last year but has not followed through as she was not sure what it was.  She has been following with cardiology after being referred by her previous Physician. However she has no cardiac diagnosis. I have advised patient that continued care under Cardiology is not required.   Patient has No headache, No chest pain, No abdominal pain - No Nausea, No new weakness tingling or numbness, No Cough - SOB.  Allergies  Allergen Reactions  . Penicillins Other (See Comments)    Breaks out. Longtime allergy   Past Medical History  Diagnosis Date  . Asthma   . Hypercholesteremia   . Hypertension   . Hyperlipidemia   . Situational stress    Current Outpatient Prescriptions on File Prior to Visit  Medication Sig Dispense Refill  . acetaminophen (TYLENOL) 500 MG tablet Take 1 tablet (500 mg total) by mouth every 6 (six) hours as needed for pain. 30 tablet 0  . ALPRAZolam (XANAX) 0.25 MG tablet Take 1 tablet (0.25  mg total) by mouth at bedtime as needed for anxiety. (Patient not taking: Reported on 12/04/2014) 30 tablet 0  . aspirin 81 MG chewable tablet Chew 81 mg by mouth daily.    . calcium citrate (CALCITRATE - DOSED IN MG ELEMENTAL CALCIUM) 950 MG tablet Take 1 tablet by mouth 2 (two) times daily.    . cetirizine (ZYRTEC) 10 MG tablet Take 10 mg by mouth daily.    . cycloSPORINE (RESTASIS) 0.05 % ophthalmic emulsion Place 1 drop into both eyes 2 (two) times daily.    . Fluticasone-Salmeterol (ADVAIR) 100-50 MCG/DOSE AEPB Inhale 1 puff into the lungs every 12 (twelve) hours.    Marland Kitchen ipratropium (ATROVENT HFA) 17 MCG/ACT inhaler Inhale 2 puffs into the lungs 3 (three) times daily as needed. For SOB 1 Inhaler 3  . mometasone (NASONEX) 50 MCG/ACT nasal spray Place 2 sprays into the nose daily. 17 g 2  . rosuvastatin (CRESTOR) 5 MG tablet Take 5 mg by mouth daily.    . SENNOSIDES PO Take 8.5 mg by mouth as needed (Swiss Kriss OTC Laxative).    . triamterene-hydrochlorothiazide (MAXZIDE-25) 37.5-25 MG per tablet Take 0.5 tablets by mouth daily. 30 tablet 2  . Vaginal Lubricant (REPLENS) GEL Place 1 application vaginally daily as needed. 6.7 g 1  . Vaginal Moisturizer (VAGISIL FEMININE MOISTURIZER)  LOTN Place vaginally 3 (three) times daily.    . zafirlukast (ACCOLATE) 20 MG tablet Take 1 tablet (20 mg total) by mouth 2 (two) times daily. 60 tablet 5   No current facility-administered medications on file prior to visit.   Family History  Problem Relation Age of Onset  . Cancer Brother     Prostate   History   Social History  . Marital Status: Widowed    Spouse Name: N/A  . Number of Children: N/A  . Years of Education: N/A   Occupational History  . Not on file.   Social History Main Topics  . Smoking status: Never Smoker   . Smokeless tobacco: Not on file  . Alcohol Use: No  . Drug Use: No  . Sexual Activity: No   Other Topics Concern  . Not on file   Social History Narrative    Review  of Systems: Constitutional: Negative for fever, chills, diaphoresis, activity change, appetite change and fatigue. HENT: Negative for ear pain, nosebleeds, congestion, facial swelling, rhinorrhea, neck pain, neck stiffness and ear discharge.  Eyes: Negative for pain, discharge, redness, itching and visual disturbance. Respiratory: Negative for cough, choking, chest tightness, shortness of breath, wheezing and stridor.  Cardiovascular: Negative for chest pain, palpitations and leg swelling. Gastrointestinal: Negative for abdominal distention. Genitourinary: Negative for dysuria, urgency, frequency, hematuria, flank pain, decreased urine volume, difficulty urinating and dyspareunia.  Neurological: Negative for dizziness, tremors, seizures, syncope, facial asymmetry, speech difficulty, weakness, light-headedness, numbness and headaches.  Hematological: Negative for adenopathy. Does not bruise/bleed easily. Psychiatric/Behavioral: Negative for hallucinations, behavioral problems, confusion, dysphoric mood, decreased concentration and agitation.     Objective:         Filed Vitals:   12/04/14 1337  BP: 113/60  Pulse: 87  Temp: 98.3 F (36.8 C)  Resp: 16    Physical Exam: Constitutional: Patient appears well-developed and well-nourished. No distress. HENT: Normocephalic, atraumatic, Cerumen impaction noted in B/L ear canals. Oropharynx is clear and moist.  Eyes: Conjunctivae and EOM are normal. PERRLA, no scleral icterus. Neck: Normal ROM. Neck supple. No JVD. No tracheal deviation. No thyromegaly. CVS: RRR, S1/S2 +, no murmurs, no gallops, no carotid bruit.  Pulmonary: Effort and breath sounds normal, no stridor, rhonchi, wheezes, rales.  Abdominal: Soft. BS +, no distension, tenderness, rebound or guarding.  Musculoskeletal: Normal range of motion. No edema and no tenderness.  Lymphadenopathy: No lymphadenopathy noted, cervical, inguinal or axillary Neuro: Alert. Normal reflexes,  muscle tone coordination. No cranial nerve deficit. Skin: Skin is warm and dry. No rash noted. Not diaphoretic. No erythema. No pallor. Psychiatric: Normal mood and affect. Behavior, judgment, thought content normal.   Lab Results  Component Value Date   WBC 6.6 05/07/2014   HGB 13.1 05/07/2014   HCT 39.3 05/07/2014   MCV 92.0 05/07/2014   PLT 225 05/07/2014   Lab Results  Component Value Date   CREATININE 0.84 05/07/2014   BUN 9 05/07/2014   NA 138 05/07/2014   K 3.8 05/07/2014   CL 103 05/07/2014   CO2 26 05/07/2014    No results found for: HGBA1C Lipid Panel     Component Value Date/Time   CHOL 158 05/07/2014 1004   TRIG 41 05/07/2014 1004   HDL 89 05/07/2014 1004   CHOLHDL 1.8 05/07/2014 1004   VLDL 8 05/07/2014 1004   LDLCALC 61 05/07/2014 1004       Assessment and plan:  1. Essential hypertension - BP is well controlled. Continue Maxzide. -  Urinalysis, Routine w reflex microscopic (not at [facility]); Standing - Urinalysis, Routine w reflex microscopic (not at [facility])  2. Cerumen impaction, bilateral - Ear Lavage  3. Screening for osteoporosis - DG Bone Density; Future - Basic Metabolic Panel - Magnesium  4. Colon cancer screening - Pt was referred to Dr. Loreta Ave. Per their discussion, Colonoscopy was not performed per patient secondary to age. Will check guiac stool cards. - POC Hemoccult Bld/Stl (3-Cd Home Screen); Future  Follow-up in 6 months  The patient was given clear instructions to go to ER or return to medical center if symptoms don't improve, worsen or new problems develop. The patient verbalized understanding. The patient was told to call to get lab results if they haven't heard anything in the next week.     This note has been created with Education officer, environmental. Any transcriptional errors are unintentional.    Samiksha Pellicano A., MD [facility] [place], [place] [phone]    12/04/2014, 2:23 PM

## 2014-12-05 LAB — POCT URINALYSIS DIP (DEVICE)
Bilirubin Urine: NEGATIVE
Glucose, UA: NEGATIVE mg/dL
Hgb urine dipstick: NEGATIVE
Ketones, ur: NEGATIVE mg/dL
LEUKOCYTES UA: NEGATIVE
NITRITE: NEGATIVE
PROTEIN: NEGATIVE mg/dL
Specific Gravity, Urine: 1.01 (ref 1.005–1.030)
Urobilinogen, UA: 0.2 mg/dL (ref 0.0–1.0)
pH: 6 (ref 5.0–8.0)

## 2014-12-08 LAB — POC HEMOCCULT BLD/STL (HOME/3-CARD/SCREEN)
Card #2 Fecal Occult Blod, POC: NEGATIVE
Card #3 Fecal Occult Blood, POC: NEGATIVE
Fecal Occult Blood, POC: NEGATIVE

## 2014-12-10 ENCOUNTER — Encounter: Payer: Self-pay | Admitting: Internal Medicine

## 2014-12-11 ENCOUNTER — Other Ambulatory Visit: Payer: Self-pay | Admitting: Internal Medicine

## 2014-12-11 DIAGNOSIS — E2839 Other primary ovarian failure: Secondary | ICD-10-CM

## 2014-12-12 ENCOUNTER — Other Ambulatory Visit: Payer: Medicare PPO

## 2014-12-12 ENCOUNTER — Ambulatory Visit
Admission: RE | Admit: 2014-12-12 | Discharge: 2014-12-12 | Disposition: A | Discharge status: Home / Self Care | Payer: Medicare PPO | Source: Ambulatory Visit | Attending: Internal Medicine | Admitting: Internal Medicine

## 2014-12-12 DIAGNOSIS — M810 Age-related osteoporosis without current pathological fracture: Secondary | ICD-10-CM | POA: Diagnosis not present

## 2014-12-12 DIAGNOSIS — E2839 Other primary ovarian failure: Secondary | ICD-10-CM

## 2014-12-18 DIAGNOSIS — J45909 Unspecified asthma, uncomplicated: Secondary | ICD-10-CM | POA: Diagnosis not present

## 2014-12-18 DIAGNOSIS — M1990 Unspecified osteoarthritis, unspecified site: Secondary | ICD-10-CM | POA: Diagnosis not present

## 2014-12-18 DIAGNOSIS — I2510 Atherosclerotic heart disease of native coronary artery without angina pectoris: Secondary | ICD-10-CM | POA: Diagnosis not present

## 2014-12-18 DIAGNOSIS — I10 Essential (primary) hypertension: Secondary | ICD-10-CM | POA: Diagnosis not present

## 2014-12-18 DIAGNOSIS — E785 Hyperlipidemia, unspecified: Secondary | ICD-10-CM | POA: Diagnosis not present

## 2015-01-16 ENCOUNTER — Other Ambulatory Visit: Payer: Self-pay

## 2015-01-16 MED ORDER — IPRATROPIUM BROMIDE HFA 17 MCG/ACT IN AERS: 2.0000 | INHALATION_SPRAY | Freq: Three times a day (TID) | RESPIRATORY_TRACT | Status: DC | PRN

## 2015-01-16 MED ORDER — TRIAMTERENE-HCTZ 37.5-25 MG PO TABS: 0.5000 | ORAL_TABLET | Freq: Every day | ORAL | Status: DC

## 2015-02-24 ENCOUNTER — Other Ambulatory Visit: Payer: Self-pay | Admitting: Family Medicine

## 2015-02-24 MED ORDER — MOMETASONE FUROATE 50 MCG/ACT NA SUSP: 2.0000 | Freq: Every day | NASAL | Status: DC

## 2015-02-24 NOTE — Telephone Encounter (Signed)
Refill for nasonex sent to pharmacy. Thanks!

## 2015-03-21 ENCOUNTER — Emergency Department (INDEPENDENT_AMBULATORY_CARE_PROVIDER_SITE_OTHER): Payer: Medicare PPO

## 2015-03-21 ENCOUNTER — Emergency Department (INDEPENDENT_AMBULATORY_CARE_PROVIDER_SITE_OTHER)
Admission: EM | Admit: 2015-03-21 | Discharge: 2015-03-21 | Disposition: A | Discharge status: Home / Self Care | Payer: Medicare PPO | Source: Home / Self Care | Attending: Family Medicine | Admitting: Family Medicine

## 2015-03-21 ENCOUNTER — Encounter (HOSPITAL_COMMUNITY): Payer: Self-pay | Admitting: Emergency Medicine

## 2015-03-21 DIAGNOSIS — S8391XA Sprain of unspecified site of right knee, initial encounter: Secondary | ICD-10-CM

## 2015-03-21 DIAGNOSIS — M25561 Pain in right knee: Secondary | ICD-10-CM | POA: Diagnosis not present

## 2015-03-21 NOTE — Discharge Instructions (Signed)
Wear knee brace for comfort, activity as tolerated.

## 2015-03-21 NOTE — ED Provider Notes (Signed)
CSN: [id number]     Arrival date & time 03/21/15  1811 History   First MD Initiated Contact with Patient 03/21/15 2001     Chief Complaint  Patient presents with  . Knee Pain   (Consider location/radiation/quality/duration/timing/severity/associated sxs/prior Treatment) Patient is a 81 y.o. female presenting with knee pain. The history is provided by the patient and a relative.  Knee Pain Location:  Knee Time since incident:  5 hours Injury: yes   Mechanism of injury comment:  Daughter in law fell against pt knee in pool this am, still sore. Knee location:  R knee Pain details:    Quality:  Sharp   Radiates to:  Does not radiate   Severity:  Mild   Onset quality:  Sudden Chronicity:  New Dislocation: no   Prior injury to area:  No Relieved by:  None tried Worsened by:  Nothing tried Ineffective treatments:  None tried   Past Medical History  Diagnosis Date  . Asthma   . Hypercholesteremia   . Hypertension   . Hyperlipidemia   . Situational stress    History reviewed. No pertinent past surgical history. Family History  Problem Relation Age of Onset  . Cancer Brother     Prostate   Social History  Substance Use Topics  . Smoking status: Never Smoker   . Smokeless tobacco: None  . Alcohol Use: No   OB History    No data available     Review of Systems  Musculoskeletal: Negative for joint swelling and gait problem.  Skin: Negative.   All other systems reviewed and are negative.   Allergies  Penicillins  Home Medications   Prior to Admission medications   Medication Sig Start Date End Date Taking? Authorizing Provider  acetaminophen (TYLENOL) 500 MG tablet Take 1 tablet (500 mg total) by mouth every 6 (six) hours as needed for pain. 06/19/12   Fayrene Helper, PA-C  ALPRAZolam Prudy Feeler) 0.25 MG tablet Take 1 tablet (0.25 mg total) by mouth at bedtime as needed for anxiety. Patient not taking: Reported on 12/04/2014 05/30/14   Massie Maroon, FNP  aspirin 81 MG  chewable tablet Chew 81 mg by mouth daily.    Historical Provider, MD  calcium citrate (CALCITRATE - DOSED IN MG ELEMENTAL CALCIUM) 950 MG tablet Take 1 tablet by mouth 2 (two) times daily.    Historical Provider, MD  cetirizine (ZYRTEC) 10 MG tablet Take 10 mg by mouth daily.    Historical Provider, MD  cycloSPORINE (RESTASIS) 0.05 % ophthalmic emulsion Place 1 drop into both eyes 2 (two) times daily.    Historical Provider, MD  Fluticasone-Salmeterol (ADVAIR) 100-50 MCG/DOSE AEPB Inhale 1 puff into the lungs every 12 (twelve) hours.    Historical Provider, MD  ipratropium (ATROVENT HFA) 17 MCG/ACT inhaler Inhale 2 puffs into the lungs 3 (three) times daily as needed. For SOB 01/16/15   Henrietta Hoover, NP  mometasone (NASONEX) 50 MCG/ACT nasal spray Place 2 sprays into the nose daily. 02/24/15   Henrietta Hoover, NP  rosuvastatin (CRESTOR) 5 MG tablet Take 5 mg by mouth daily.    Historical Provider, MD  SENNOSIDES PO Take 8.5 mg by mouth as needed (Swiss Kriss OTC Laxative).    Historical Provider, MD  triamterene-hydrochlorothiazide (MAXZIDE-25) 37.5-25 MG per tablet Take 0.5 tablets by mouth daily. 01/16/15   Henrietta Hoover, NP  Vaginal Lubricant (REPLENS) GEL Place 1 application vaginally daily as needed. 08/20/14   Massie Maroon, FNP  Vaginal  Moisturizer (VAGISIL FEMININE MOISTURIZER) LOTN Place vaginally 3 (three) times daily.    Historical Provider, MD  zafirlukast (ACCOLATE) 20 MG tablet Take 1 tablet (20 mg total) by mouth 2 (two) times daily. 09/15/14   Massie Maroon, FNP   Meds Ordered and Administered this Visit  Medications - No data to display  BP 151/62 mmHg  Pulse 107  Temp(Src) 99.2 F (37.3 C) (Oral)  Resp 16  SpO2 100% No data found.   Physical Exam  Constitutional: She is oriented to person, place, and time. She appears well-developed and well-nourished. No distress.  Musculoskeletal: She exhibits tenderness. She exhibits no edema.       Right knee: She  exhibits normal range of motion, no swelling, no effusion, no deformity and normal patellar mobility. Tenderness found. Medial joint line tenderness noted. No patellar tendon tenderness noted.       Legs: Neurological: She is alert and oriented to person, place, and time.  Skin: Skin is warm and dry.  Nursing note and vitals reviewed.   ED Course  Procedures (including critical care time)  Labs Review Labs Reviewed - No data to display  Imaging Review Dg Knee Complete 4 Views Right  03/21/2015   CLINICAL DATA:  Right knee pain when walking after falling today.  EXAM: RIGHT KNEE - COMPLETE 4+ VIEW  COMPARISON:  None.  FINDINGS: Tricompartmental spur formation. No fracture, dislocation or effusion.  IMPRESSION: No fracture.  Mild tricompartmental degenerative changes.   Electronically Signed   By: Beckie Salts M.D.   On: 03/21/2015 19:57   X-rays reviewed and report per radiologist.   Visual Acuity Review  Right Eye Distance:   Left Eye Distance:   Bilateral Distance:    Right Eye Near:   Left Eye Near:    Bilateral Near:         MDM   1. Knee sprain and strain, right, initial encounter        Linna Hoff, MD 03/21/15 2129

## 2015-03-23 ENCOUNTER — Other Ambulatory Visit: Payer: Self-pay | Admitting: Family Medicine

## 2015-04-16 ENCOUNTER — Other Ambulatory Visit: Payer: Self-pay | Admitting: Family Medicine

## 2015-04-23 DIAGNOSIS — E785 Hyperlipidemia, unspecified: Secondary | ICD-10-CM | POA: Diagnosis not present

## 2015-04-23 DIAGNOSIS — I2510 Atherosclerotic heart disease of native coronary artery without angina pectoris: Secondary | ICD-10-CM | POA: Diagnosis not present

## 2015-04-23 DIAGNOSIS — J45909 Unspecified asthma, uncomplicated: Secondary | ICD-10-CM | POA: Diagnosis not present

## 2015-04-23 DIAGNOSIS — M1990 Unspecified osteoarthritis, unspecified site: Secondary | ICD-10-CM | POA: Diagnosis not present

## 2015-04-23 DIAGNOSIS — I10 Essential (primary) hypertension: Secondary | ICD-10-CM | POA: Diagnosis not present

## 2015-05-04 ENCOUNTER — Ambulatory Visit (INDEPENDENT_AMBULATORY_CARE_PROVIDER_SITE_OTHER): Payer: Medicare PPO | Admitting: Family Medicine

## 2015-05-04 VITALS — BP 130/67 | HR 84 | Temp 97.9°F | Resp 16 | Ht <= 58 in | Wt 120.0 lb

## 2015-05-04 DIAGNOSIS — J4520 Mild intermittent asthma, uncomplicated: Secondary | ICD-10-CM | POA: Diagnosis not present

## 2015-05-04 DIAGNOSIS — J45909 Unspecified asthma, uncomplicated: Secondary | ICD-10-CM | POA: Insufficient documentation

## 2015-05-04 DIAGNOSIS — E785 Hyperlipidemia, unspecified: Secondary | ICD-10-CM

## 2015-05-04 DIAGNOSIS — I10 Essential (primary) hypertension: Secondary | ICD-10-CM

## 2015-05-04 DIAGNOSIS — N898 Other specified noninflammatory disorders of vagina: Secondary | ICD-10-CM

## 2015-05-04 DIAGNOSIS — F4321 Adjustment disorder with depressed mood: Secondary | ICD-10-CM

## 2015-05-04 DIAGNOSIS — L298 Other pruritus: Secondary | ICD-10-CM | POA: Diagnosis not present

## 2015-05-04 LAB — POCT URINALYSIS DIP (DEVICE)
BILIRUBIN URINE: NEGATIVE
GLUCOSE, UA: NEGATIVE mg/dL
Ketones, ur: NEGATIVE mg/dL
Leukocytes, UA: NEGATIVE
NITRITE: NEGATIVE
PH: 6.5 (ref 5.0–8.0)
PROTEIN: NEGATIVE mg/dL
Specific Gravity, Urine: 1.01 (ref 1.005–1.030)
Urobilinogen, UA: 0.2 mg/dL (ref 0.0–1.0)

## 2015-05-04 MED ORDER — FLUTICASONE-SALMETEROL 100-50 MCG/DOSE IN AEPB: 1.0000 | INHALATION_SPRAY | Freq: Two times a day (BID) | RESPIRATORY_TRACT | Status: DC

## 2015-05-04 NOTE — Progress Notes (Signed)
Subjective:    Patient ID: [name], female    DOB: 1931-12-24, 81 y.o.   MRN: [id number]  HPI  [name], patient with a history of controlled hypertension presents for follow up. Patient has been taking medications consistently. [name] reports that she eats a balanced diet, exercises in a [employer] program 2-3 days per week, and remains active with her church. She states that she only sleeps 4-5 hours per night since her husband passed away several years ago. She is up to date on her vaccinations, has opthalmology and dental visits every 6 months. [name] is post menopausal and does not have vaginal bleeding. [name] is also complaining of increased vaginal dryness. She reports that she has been sing Theme park managerVagisil Feminine Moisturizer and baby oil without relief. She states that vaginal dryness is uncomfortable. [name] is postmenpausal. She denies vaginal discharge, uterine bleeding, or dysuria.   She also complains of periodic depression and anxiety during the holidays. She states that she has been having episodes periodically since her husband passed away. She reports that she experiences depression primarily when she is alone. She states that she tries to remain active with her church and organizations so that she is not alone. She often visits her daughter. Patient denies suicidal or homicidal ideations. She states that she has taken Xanax in the past for anxiety. She states that she would rather not take medications for anxiety.   Past Medical History  Diagnosis Date  . Asthma   . Hypercholesteremia   . Hypertension   . Hyperlipidemia   . Situational stress    Social History   Social History  . Marital Status: Widowed    Spouse Name: N/A  . Number of Children: N/A  . Years of Education: N/A   Occupational History  . Not on file.   Social History Main Topics  . Smoking status: Never Smoker   . Smokeless tobacco: Not on file  . Alcohol Use: No   . Drug Use: No  . Sexual Activity: No   Other Topics Concern  . Not on file   Social History Narrative   Allergies  Allergen Reactions  . Penicillins Other (See Comments)    Breaks out. Longtime allergy   Review of Systems  HENT:       Decreased sense of smell  Eyes:       Dry eyes controlled by restasis and Systane  Respiratory: Negative for apnea, chest tightness and shortness of breath.   Cardiovascular: Negative.   Endocrine: Negative.   Genitourinary: Negative for vaginal discharge, difficulty urinating and vaginal pain.       Vaginal dryness/vaginal itching  Musculoskeletal: Negative.   Skin: Negative.   Neurological: Positive for headaches. Negative for dizziness and tremors.  Hematological: Negative.   Psychiatric/Behavioral: Positive for sleep disturbance. The patient is nervous/anxious (Patient reports that anxiety increases closer to the holidays).        Objective:   Physical Exam  Constitutional: She is oriented to person, place, and time. She appears well-developed and well-nourished.  HENT:  Head: Normocephalic and atraumatic.  Right Ear: External ear normal.  Left Ear: External ear normal.  Mouth/Throat: Oropharynx is clear and moist.  Eyes: Conjunctivae and EOM are normal. Pupils are equal, round, and reactive to light.  Neck: Normal range of motion. Neck supple.  Cardiovascular: Normal rate, regular rhythm, normal heart sounds and intact distal pulses.   Abdominal: Soft. Bowel sounds are normal.  Musculoskeletal:  Normal range of motion.  Neurological: She is alert and oriented to person, place, and time. She has normal reflexes.  Skin: Skin is warm and dry.  Psychiatric: She has a normal mood and affect. Her behavior is normal. Judgment and thought content normal.   BP 130/67 mmHg  Pulse 84  Temp(Src) 97.9 F (36.6 C) (Oral)  Resp 16  Ht  (1.448 m)  Wt 120 lb (54.432 kg)  BMI 25.96 kg/m2    Assessment & Plan:  1. Essential  hypertension Blood pressure is at goal on current medication regimen. No proteinuria present. Patient continues to exercise at the [facility] and eats a balanced diet. Will continue current medication regimen.  - POCT urinalysis dip (device)  2. Hyperlipidemia Will continue medication as previously prescribed. Will return to office in December for fasting labs prior to complete physical examination.   3. Asthma, mild intermittent, uncomplicated Asthma controlled on twice daily Advair, will continue - Fluticasone-Salmeterol (ADVAIR) 100-50 MCG/DOSE AEPB; Inhale 1 puff into the lungs every 12 (twelve) hours.  Dispense: 60 each; Refill: 3  4. Vaginal itching Wet prep negative. Recommend that patient discontinue using Vagisil and Baby Oil to vagina. Follow up in office is problem continues - POCT wet + KOH prep  5. Vaginal dryness Recommend Adding K-Y Jelly and using hypoallergenic feminine wash.  - POCT wet + KOH prep  6. Situational depression  Patient has situational depression during this time of year. She does not have any family in [place] and tends to feel lonely. Patient has taken Xanax in the past for anxiety, recommend counseling. Recommend the [facility]. Patient denies suicidal or homicidal ideations - Ambulatory referral to Psychology    RTC: December 2016 for fasting labs  Massie Maroon, FNP

## 2015-05-04 NOTE — Patient Instructions (Addendum)
Monday, June 01, 2015 at 8:30 fasting labs Refrain from using vaginal gel, replens pads, or vagisil for 2 weeks. Sleep without underwear. Wear cotton underwear with an unscented panty liner.  Refrain from using washcloths with color. Pat dry after shower.  Use Dove Unscented Bodywash

## 2015-05-05 ENCOUNTER — Encounter: Payer: Self-pay | Admitting: Family Medicine

## 2015-05-13 ENCOUNTER — Other Ambulatory Visit: Payer: Self-pay | Admitting: Family Medicine

## 2015-06-01 ENCOUNTER — Other Ambulatory Visit (INDEPENDENT_AMBULATORY_CARE_PROVIDER_SITE_OTHER): Payer: Medicare PPO

## 2015-06-01 ENCOUNTER — Other Ambulatory Visit: Payer: Self-pay | Admitting: Family Medicine

## 2015-06-01 DIAGNOSIS — E785 Hyperlipidemia, unspecified: Secondary | ICD-10-CM | POA: Diagnosis not present

## 2015-06-01 LAB — COMPREHENSIVE METABOLIC PANEL
ALBUMIN: 4.3 g/dL (ref 3.6–5.1)
ALT: 12 U/L (ref 6–29)
AST: 23 U/L (ref 10–35)
Alkaline Phosphatase: 47 U/L (ref 33–130)
BUN: 8 mg/dL (ref 7–25)
CHLORIDE: 100 mmol/L (ref 98–110)
CO2: 27 mmol/L (ref 20–31)
CREATININE: 0.82 mg/dL (ref 0.60–0.88)
Calcium: 9.1 mg/dL (ref 8.6–10.4)
Glucose, Bld: 80 mg/dL (ref 65–99)
POTASSIUM: 3.7 mmol/L (ref 3.5–5.3)
SODIUM: 135 mmol/L (ref 135–146)
Total Bilirubin: 0.5 mg/dL (ref 0.2–1.2)
Total Protein: 6.9 g/dL (ref 6.1–8.1)

## 2015-06-01 LAB — LIPID PANEL
CHOL/HDL RATIO: 1.6 ratio (ref <=5.0)
Cholesterol: 160 mg/dL (ref 125–200)
HDL: 99 mg/dL (ref >=46)
LDL CALC: 53 mg/dL (ref <130)
TRIGLYCERIDES: 42 mg/dL (ref <150)
VLDL: 8 mg/dL (ref <30)

## 2015-06-08 ENCOUNTER — Encounter: Payer: Medicare PPO | Admitting: Family Medicine

## 2015-06-09 DIAGNOSIS — H35371 Puckering of macula, right eye: Secondary | ICD-10-CM | POA: Diagnosis not present

## 2015-06-09 DIAGNOSIS — H43813 Vitreous degeneration, bilateral: Secondary | ICD-10-CM | POA: Diagnosis not present

## 2015-06-09 DIAGNOSIS — H04123 Dry eye syndrome of bilateral lacrimal glands: Secondary | ICD-10-CM | POA: Diagnosis not present

## 2015-06-09 DIAGNOSIS — H40023 Open angle with borderline findings, high risk, bilateral: Secondary | ICD-10-CM | POA: Diagnosis not present

## 2015-06-15 ENCOUNTER — Other Ambulatory Visit: Payer: Self-pay | Admitting: Family Medicine

## 2015-07-16 ENCOUNTER — Other Ambulatory Visit: Payer: Self-pay | Admitting: Family Medicine

## 2015-07-24 ENCOUNTER — Encounter: Payer: Medicare PPO | Admitting: Family Medicine

## 2015-07-26 NOTE — Progress Notes (Signed)
This encounter was created in error - please disregard.

## 2015-08-14 ENCOUNTER — Other Ambulatory Visit: Payer: Self-pay | Admitting: Family Medicine

## 2015-09-11 ENCOUNTER — Other Ambulatory Visit: Payer: Self-pay | Admitting: Family Medicine

## 2015-10-05 ENCOUNTER — Other Ambulatory Visit: Payer: Self-pay

## 2015-10-05 DIAGNOSIS — Z1231 Encounter for screening mammogram for malignant neoplasm of breast: Secondary | ICD-10-CM

## 2015-10-12 ENCOUNTER — Other Ambulatory Visit: Payer: Self-pay | Admitting: Family Medicine

## 2015-10-22 ENCOUNTER — Encounter: Payer: Self-pay | Admitting: Family Medicine

## 2015-10-22 ENCOUNTER — Other Ambulatory Visit: Payer: Self-pay | Admitting: Family Medicine

## 2015-10-22 ENCOUNTER — Ambulatory Visit (INDEPENDENT_AMBULATORY_CARE_PROVIDER_SITE_OTHER): Payer: Medicare Other | Admitting: Family Medicine

## 2015-10-22 VITALS — BP 125/63 | HR 78 | Temp 97.9°F | Resp 14 | Ht <= 58 in | Wt 119.0 lb

## 2015-10-22 DIAGNOSIS — Z0000 Encounter for general adult medical examination without abnormal findings: Secondary | ICD-10-CM | POA: Diagnosis not present

## 2015-10-22 DIAGNOSIS — I10 Essential (primary) hypertension: Secondary | ICD-10-CM

## 2015-10-22 NOTE — Patient Instructions (Signed)
Health Maintenance, Female Adopting a healthy lifestyle and getting preventive care can go a long way to promote health and wellness. Talk with your health care provider about what schedule of regular examinations is right for you. This is a good chance for you to check in with your provider about disease prevention and staying healthy. In between checkups, there are plenty of things you can do on your own. Experts have done a lot of research about which lifestyle changes and preventive measures are most likely to keep you healthy. Ask your health care provider for more information. WEIGHT AND DIET  Eat a healthy diet  Be sure to include plenty of vegetables, fruits, low-fat dairy products, and lean protein.  Do not eat a lot of foods high in solid fats, added sugars, or salt.  Get regular exercise. This is one of the most important things you can do for your health.  Most adults should exercise for at least 150 minutes each week. The exercise should increase your heart rate and make you sweat (moderate-intensity exercise).  Most adults should also do strengthening exercises at least twice a week. This is in addition to the moderate-intensity exercise.  Maintain a healthy weight  Body mass index (BMI) is a measurement that can be used to identify possible weight problems. It estimates body fat based on height and weight. Your health care provider can help determine your BMI and help you achieve or maintain a healthy weight.  For females 20 years of age and older:   A BMI below 18.5 is considered underweight.  A BMI of 18.5 to 24.9 is normal.  A BMI of 25 to 29.9 is considered overweight.  A BMI of 30 and above is considered obese.  Watch levels of cholesterol and blood lipids  You should start having your blood tested for lipids and cholesterol at 80 years of age, then have this test every 5 years.  You may need to have your cholesterol levels checked more often if:  Your lipid  or cholesterol levels are high.  You are older than 80 years of age.  You are at high risk for heart disease.  CANCER SCREENING   Lung Cancer  Lung cancer screening is recommended for adults 55-80 years old who are at high risk for lung cancer because of a history of smoking.  A yearly low-dose CT scan of the lungs is recommended for people who:  Currently smoke.  Have quit within the past 15 years.  Have at least a 30-pack-year history of smoking. A pack year is smoking an average of one pack of cigarettes a day for 1 year.  Yearly screening should continue until it has been 15 years since you quit.  Yearly screening should stop if you develop a health problem that would prevent you from having lung cancer treatment.  Breast Cancer  Practice breast self-awareness. This means understanding how your breasts normally appear and feel.  It also means doing regular breast self-exams. Let your health care provider know about any changes, no matter how small.  If you are in your 20s or 30s, you should have a clinical breast exam (CBE) by a health care provider every 1-3 years as part of a regular health exam.  If you are 40 or older, have a CBE every year. Also consider having a breast X-ray (mammogram) every year.  If you have a family history of breast cancer, talk to your health care provider about genetic screening.  If you   are at high risk for breast cancer, talk to your health care provider about having an MRI and a mammogram every year.  Breast cancer gene (BRCA) assessment is recommended for women who have family members with BRCA-related cancers. BRCA-related cancers include:  Breast.  Ovarian.  Tubal.  Peritoneal cancers.  Results of the assessment will determine the need for genetic counseling and BRCA1 and BRCA2 testing. Cervical Cancer Your health care provider may recommend that you be screened regularly for cancer of the pelvic organs (ovaries, uterus, and  vagina). This screening involves a pelvic examination, including checking for microscopic changes to the surface of your cervix (Pap test). You may be encouraged to have this screening done every 3 years, beginning at age 21.  For women ages 30-65, health care providers may recommend pelvic exams and Pap testing every 3 years, or they may recommend the Pap and pelvic exam, combined with testing for human papilloma virus (HPV), every 5 years. Some types of HPV increase your risk of cervical cancer. Testing for HPV may also be done on women of any age with unclear Pap test results.  Other health care providers may not recommend any screening for nonpregnant women who are considered low risk for pelvic cancer and who do not have symptoms. Ask your health care provider if a screening pelvic exam is right for you.  If you have had past treatment for cervical cancer or a condition that could lead to cancer, you need Pap tests and screening for cancer for at least 20 years after your treatment. If Pap tests have been discontinued, your risk factors (such as having a new sexual partner) need to be reassessed to determine if screening should resume. Some women have medical problems that increase the chance of getting cervical cancer. In these cases, your health care provider may recommend more frequent screening and Pap tests. Colorectal Cancer  This type of cancer can be detected and often prevented.  Routine colorectal cancer screening usually begins at 80 years of age and continues through 80 years of age.  Your health care provider may recommend screening at an earlier age if you have risk factors for colon cancer.  Your health care provider may also recommend using home test kits to check for hidden blood in the stool.  A small camera at the end of a tube can be used to examine your colon directly (sigmoidoscopy or colonoscopy). This is done to check for the earliest forms of colorectal  cancer.  Routine screening usually begins at age 50.  Direct examination of the colon should be repeated every 5-10 years through 80 years of age. However, you may need to be screened more often if early forms of precancerous polyps or small growths are found. Skin Cancer  Check your skin from head to toe regularly.  Tell your health care provider about any new moles or changes in moles, especially if there is a change in a mole's shape or color.  Also tell your health care provider if you have a mole that is larger than the size of a pencil eraser.  Always use sunscreen. Apply sunscreen liberally and repeatedly throughout the day.  Protect yourself by wearing long sleeves, pants, a wide-brimmed hat, and sunglasses whenever you are outside. HEART DISEASE, DIABETES, AND HIGH BLOOD PRESSURE   High blood pressure causes heart disease and increases the risk of stroke. High blood pressure is more likely to develop in:  People who have blood pressure in the high end   of the normal range (130-139/85-89 mm Hg).  People who are overweight or obese.  People who are African American.  If you are 38-23 years of age, have your blood pressure checked every 3-5 years. If you are 61 years of age or older, have your blood pressure checked every year. You should have your blood pressure measured twice--once when you are at a hospital or clinic, and once when you are not at a hospital or clinic. Record the average of the two measurements. To check your blood pressure when you are not at a hospital or clinic, you can use:  An automated blood pressure machine at a pharmacy.  A home blood pressure monitor.  If you are between 45 years and 39 years old, ask your health care provider if you should take aspirin to prevent strokes.  Have regular diabetes screenings. This involves taking a blood sample to check your fasting blood sugar level.  If you are at a normal weight and have a low risk for diabetes,  have this test once every three years after 80 years of age.  If you are overweight and have a high risk for diabetes, consider being tested at a younger age or more often. PREVENTING INFECTION  Hepatitis B  If you have a higher risk for hepatitis B, you should be screened for this virus. You are considered at high risk for hepatitis B if:  You were born in a country where hepatitis B is common. Ask your health care provider which countries are considered high risk.  Your parents were born in a high-risk country, and you have not been immunized against hepatitis B (hepatitis B vaccine).  You have HIV or AIDS.  You use needles to inject street drugs.  You live with someone who has hepatitis B.  You have had sex with someone who has hepatitis B.  You get hemodialysis treatment.  You take certain medicines for conditions, including cancer, organ transplantation, and autoimmune conditions. Hepatitis C  Blood testing is recommended for:  Everyone born from [birth year] through 1963.  Anyone with known risk factors for hepatitis C. Sexually transmitted infections (STIs)  You should be screened for sexually transmitted infections (STIs) including gonorrhea and chlamydia if:  You are sexually active and are younger than 80 years of age.  You are older than 80 years of age and your health care provider tells you that you are at risk for this type of infection.  Your sexual activity has changed since you were last screened and you are at an increased risk for chlamydia or gonorrhea. Ask your health care provider if you are at risk.  If you do not have HIV, but are at risk, it may be recommended that you take a prescription medicine daily to prevent HIV infection. This is called pre-exposure prophylaxis (PrEP). You are considered at risk if:  You are sexually active and do not regularly use condoms or know the HIV status of your partner(s).  You take drugs by injection.  You are sexually  active with a partner who has HIV. Talk with your health care provider about whether you are at high risk of being infected with HIV. If you choose to begin PrEP, you should first be tested for HIV. You should then be tested every 3 months for as long as you are taking PrEP.  PREGNANCY   If you are premenopausal and you may become pregnant, ask your health care provider about preconception counseling.  If you may  become pregnant, take 400 to 800 micrograms (mcg) of folic acid every day.  If you want to prevent pregnancy, talk to your health care provider about birth control (contraception). OSTEOPOROSIS AND MENOPAUSE   Osteoporosis is a disease in which the bones lose minerals and strength with aging. This can result in serious bone fractures. Your risk for osteoporosis can be identified using a bone density scan.  If you are 61 years of age or older, or if you are at risk for osteoporosis and fractures, ask your health care provider if you should be screened.  Ask your health care provider whether you should take a calcium or vitamin D supplement to lower your risk for osteoporosis.  Menopause may have certain physical symptoms and risks.  Hormone replacement therapy may reduce some of these symptoms and risks. Talk to your health care provider about whether hormone replacement therapy is right for you.  HOME CARE INSTRUCTIONS   Schedule regular health, dental, and eye exams.  Stay current with your immunizations.   Do not use any tobacco products including cigarettes, chewing tobacco, or electronic cigarettes.  If you are pregnant, do not drink alcohol.  If you are breastfeeding, limit how much and how often you drink alcohol.  Limit alcohol intake to no more than 1 drink per day for nonpregnant women. One drink equals 12 ounces of beer, 5 ounces of wine, or 1 ounces of hard liquor.  Do not use street drugs.  Do not share needles.  Ask your health care provider for help if  you need support or information about quitting drugs.  Tell your health care provider if you often feel depressed.  Tell your health care provider if you have ever been abused or do not feel safe at home.   This information is not intended to replace advice given to you by your health care provider. Make sure you discuss any questions you have with your health care provider.   Document Released: 12/27/2010 Document Revised: 07/04/2014 Document Reviewed: 05/15/2013 Elsevier Interactive Patient Education [employer].

## 2015-10-22 NOTE — Progress Notes (Signed)
ANNUAL PREVENTATIVE VISIT AND CPE  Subjective:  Sarah Valenzuela is a 82 y.o. female who presents for Annual Wellness Visit and complete physical.  Date of last wellness visit  Was 08/20/2014.   She has had elevated blood pressure for years. Her blood pressure has been controlled at home, today her BP is BP: 125/63 mmHg.  She does workout. She denies chest pain, shortness of breath, dizziness.    She is on cholesterol medication and denies myalgias. Her cholesterol is at goal. The cholesterol last visit was:   Lab Results  Component Value Date   CHOL 160 06/01/2015   HDL 99 06/01/2015   LDLCALC 53 06/01/2015   TRIG 42 06/01/2015   CHOLHDL 1.6 06/01/2015   She does not have a history of diabetes. years. Lab Results  Component Value Date   VD25OH 52 05/07/2014       Names of Other Physician/Practitioners you currently use: 1. [facility] here for primary care Sarah Valenzuela 2. Dr. Elmer Picker and Dr. Earlene Plater Opthalmology    Patient Care Team: Massie Maroon, FNP as PCP - General (Family Medicine)   Medication Review: Current Outpatient Prescriptions on File Prior to Visit  Medication Sig Dispense Refill  . acetaminophen (TYLENOL) 500 MG tablet Take 1 tablet (500 mg total) by mouth every 6 (six) hours as needed for pain. 30 tablet 0  . ADVAIR DISKUS 100-50 MCG/DOSE AEPB USE 1 INHALATION TWICE DAILY. RINSE MOUTH AFTER USE. 60 each 0  . aspirin 81 MG chewable tablet Chew 81 mg by mouth daily.    . calcium citrate (CALCITRATE - DOSED IN MG ELEMENTAL CALCIUM) 950 MG tablet Take 1 tablet by mouth 2 (two) times daily.    . cetirizine (ZYRTEC) 10 MG tablet Take 10 mg by mouth daily.    . cycloSPORINE (RESTASIS) 0.05 % ophthalmic emulsion Place 1 drop into both eyes 2 (two) times daily.    Marland Kitchen ipratropium (ATROVENT HFA) 17 MCG/ACT inhaler Inhale 2 puffs into the lungs 3 (three) times daily as needed. For SOB 1 Inhaler 3  . mometasone (NASONEX) 50 MCG/ACT nasal spray USE 2 SPRAYS IN  EACH NOSTRIL ONCE A DAY. 17 g 0  . rosuvastatin (CRESTOR) 5 MG tablet Take 5 mg by mouth daily.    . SENNOSIDES PO Take 8.5 mg by mouth as needed (Swiss Kriss OTC Laxative).    . triamterene-hydrochlorothiazide (MAXZIDE-25) 37.5-25 MG per tablet Take 0.5 tablets by mouth daily. 30 tablet 2  . Vaginal Moisturizer (VAGISIL FEMININE MOISTURIZER) LOTN Place vaginally 3 (three) times daily.    . zafirlukast (ACCOLATE) 20 MG tablet TAKE 1 TABLET TWICE DAILY. 60 tablet 0  . Vaginal Lubricant (REPLENS) GEL Place 1 application vaginally daily as needed. (Patient not taking: Reported on 05/04/2015) 6.7 g 1   No current facility-administered medications on file prior to visit.    Current Problems (verified) Patient Active Problem List   Diagnosis Date Noted  . Asthma 05/04/2015  . Medicare annual wellness visit, subsequent 08/20/2014  . Vaginal dryness 06/02/2014  . Situational depression 05/30/2014  . Anxiety state 05/30/2014  . Vaginal itching 11/14/2012  . Occasional tremors 11/14/2012  . HTN (hypertension) 11/14/2012  . Hyperlipidemia 11/14/2012  . Adjustment disorder with mixed anxiety and depressed mood 11/14/2012  . Seasonal allergies 11/14/2012    Screening Tests Health Maintenance  Topic Date Due  . ZOSTAVAX  01/08/1994  . INFLUENZA VACCINE  01/26/2016  . TETANUS/TDAP  08/22/2023  . DEXA SCAN  Completed  Immunization History  Administered Date(s) Administered  . Influenza Whole 04/17/2012  . Influenza,inj,Quad PF,36+ Mos 04/01/2013, 03/24/2014  . Influenza-Unspecified 04/19/2015  . Pneumococcal Conjugate-13 08/20/2014  . Tdap 08/21/2013    Preventative care: Last colonoscopy: last colonoscopy with Dr. Loreta Ave. Patient does not require a colonoscopy.  Last mammogram: Nov 20, 2015 Last pap smear/pelvic exam: N/A DEXA: Patient had a bone density scan in 2016   Immunization History  Administered Date(s) Administered  . Influenza Whole 04/17/2012  . Influenza,inj,Quad  PF,36+ Mos 04/01/2013, 03/24/2014  . Influenza-Unspecified 04/19/2015  . Pneumococcal Conjugate-13 08/20/2014  . Tdap 08/21/2013     Medication List       This list is accurate as of: 10/22/15 10:31 AM.  Always use your most recent med list.               acetaminophen 500 MG tablet  Commonly known as:  TYLENOL  Take 1 tablet (500 mg total) by mouth every 6 (six) hours as needed for pain.     ADVAIR DISKUS 100-50 MCG/DOSE Aepb  Generic drug:  Fluticasone-Salmeterol  USE 1 INHALATION TWICE DAILY. RINSE MOUTH AFTER USE.     aspirin 81 MG chewable tablet  Chew 81 mg by mouth daily.     calcium citrate 950 MG tablet  Commonly known as:  CALCITRATE - dosed in mg elemental calcium  Take 1 tablet by mouth 2 (two) times daily.     cetirizine 10 MG tablet  Commonly known as:  ZYRTEC  Take 10 mg by mouth daily.     cycloSPORINE 0.05 % ophthalmic emulsion  Commonly known as:  RESTASIS  Place 1 drop into both eyes 2 (two) times daily.     ipratropium 17 MCG/ACT inhaler  Commonly known as:  ATROVENT HFA  Inhale 2 puffs into the lungs 3 (three) times daily as needed. For SOB     mometasone 50 MCG/ACT nasal spray  Commonly known as:  NASONEX  USE 2 SPRAYS IN EACH NOSTRIL ONCE A DAY.     REPLENS Gel  Place 1 application vaginally daily as needed.     rosuvastatin 5 MG tablet  Commonly known as:  CRESTOR  Take 5 mg by mouth daily.     SENNOSIDES PO  Take 8.5 mg by mouth as needed (Swiss Kriss OTC Laxative).     triamterene-hydrochlorothiazide 37.5-25 MG tablet  Commonly known as:  MAXZIDE-25  Take 0.5 tablets by mouth daily.     VAGISIL FEMININE MOISTURIZER Lotn  Place vaginally 3 (three) times daily.     zafirlukast 20 MG tablet  Commonly known as:  ACCOLATE  TAKE 1 TABLET TWICE DAILY.        No past surgical history on file. Family History  Problem Relation Age of Onset  . Cancer Brother     Prostate    History reviewed: allergies, current medications,  past family history, past medical history, past social history, past surgical history and problem list   Risk Factors: Osteoporosis/FallRisk:  In the past year have you fallen or had a near fall?:No History of fracture in the past year: No  Tobacco Social History  Substance Use Topics  . Smoking status: Never Smoker   . Smokeless tobacco: Not on file  . Alcohol Use: No   She does not smoke.  Patient is not a former smoker. Are there smokers in your home (other than you)?  No  Alcohol Current alcohol use: none  Caffeine Current caffeine use: denies use  Exercise Current exercise: cardiovascular workout on exercise equipment  Nutrition/Diet Current diet: in general, a "healthy" diet    Cardiac risk factors: dyslipidemia.  Depression Screen Depression screen [facility] 2/9 10/22/2015 07/24/2015 05/04/2015 12/04/2014 05/30/2014  Decreased Interest 0 0 0 0 1  Down, Depressed, Hopeless 0 0 0 0 0  PHQ - 2 Score 0 0 0 0 1     Q1: Over the past two weeks, have you felt down, depressed or hopeless? No  Q2: Over the past two weeks, have you felt little interest or pleasure in doing things? No   Have you lost interest or pleasure in daily life? No  Do you often feel hopeless? No  Do you cry easily over simple problems? No  Activities of Daily Living  In your present state of health, do you have any difficulty performing the following activities? Driving? No Managing money?  No Feeding yourself? No Getting from bed to chair? No Climbing a flight of stairs? No Preparing food and eating? No Bathing or showering? No Getting dressed: No Getting to the toilet? No Using the toilet: No Moving around from place to place: No In the past year have you fallen or had a near fall? No   Are you sexually active?  No   Vision Difficulties: Reading difficulty Seeing spots at time  Hearing Difficulties: Do you often ask people to speak up or repeat themselves?  Do you experience ringing or  noises in your ears? No  Do you have difficulty understanding soft or whispered voices? No  Cognition  Do you feel that you have a problem with memory? No  Do you often misplace items? No  Do you feel safe at home?  Yes  Advanced directives Does patient have a Health Care Power of Attorney? Yes ; Sarah Valenzuela Does patient have a Living Will? Yes   Objective:     Blood pressure 125/63, pulse 78, temperature 97.9 F (36.6 C), temperature source Oral, resp. rate 14, height  (1.448 m), weight 119 lb (53.978 kg), SpO2 100 %. Body mass index is 25.74 kg/(m^2).  General appearance: alert, no distress, WD/WN, female Cognitive Testing  Alert? Yes  Normal Appearance?Yes  Oriented to person? Yes  Place? Yes   Time? Yes  Recall of three objects?  Yes  Can perform simple calculations? Yes  Displays appropriate judgment?Yes  Can read the correct time from a watch face?Yes Mini mental state exam score is 29.        HEENT: normocephalic, sclerae anicteric, TMs pearly, nares patent, no discharge or erythema, pharynx normal Oral cavity: MMM, no lesions Neck: supple, no lymphadenopathy, no thyromegaly, no masses Heart: RRR, normal S1, S2, no murmurs Lungs: CTA bilaterally, no wheezes, rhonchi, or rales  Abdomen: +bs, soft, non tender, non distended, no masses, no hepatomegaly, no splenomegaly Musculoskeletal: nontender, no swelling, no obvious deformity Extremities: no edema, no cyanosis, no clubbing Pulses: 2+ symmetric, upper and lower extremities, normal cap refill Neurological: alert, oriented x 3, CN2-12 intact, strength normal upper extremities and lower extremities, sensation normal throughout, DTRs 2+ throughout, no cerebellar signs, gait normal Vaginal:   Psychiatric: normal affect, behavior normal, pleasant   Assessment:  Patient denies any difficulties at home. No trouble with ADLs, depression or falls. No recent changes to vision or hearing. Is UTD with  immunizations. Is UTD with screening. Discussed Advanced Directives, patient agrees to bring [place] copies of documents if can. Encouraged heart healthy diet, exercise as tolerated and adequate sleep. Declines  flu shot. Pap smear done today.     Plan:   During the course of the visit the patient was educated and counseled about appropriate screening and preventive services including:    Pneumococcal vaccine   Influenza vaccine  Td vaccine  Screening electrocardiogram; Patient is under the care of cardiologist, and EKG was performed in office in 2015.   Bone densitometry screening  Colorectal cancer screening  Diabetes screening  Glaucoma screening  Nutrition counseling   Advanced directives: requested  Screening recommendations, referrals: Vaccinations: Please see documentation below and orders this visit.  Nutrition assessed and recommended  Colonoscopy not indicated Recommended yearly ophthalmology/optometry visit for glaucoma screening and checkup Recommended yearly dental visit for hygiene and checkup Advanced directives - on file  Conditions/risks identified: BMI: Discussed weight loss, diet, and increase physical activity.  Increase physical activity: AHA recommends 150 minutes of physical activity a week.  Medications reviewed Urinary Incontinence is not an issue: discussed non pharmacology and pharmacology options.  Fall risk: - discussed PT, home fall assessment, medications.    Medicare Attestation I have personally reviewed: The patient's medical and social history Their use of alcohol, tobacco or illicit drugs Their current medications and supplements The patient's functional ability including ADLs,fall risks, home safety risks, cognitive, and hearing and visual impairment Diet and physical activities Evidence for depression or mood disorders  The patient's weight, height, BMI, and visual acuity have been recorded in the chart.  I have made referrals,  counseling, and provided education to the patient based on review of the above and I have provided the patient with a written personalized care plan for preventive services.     Sarah Dunwoody Judie PetitM, FNP   10/22/2015

## 2015-11-11 ENCOUNTER — Other Ambulatory Visit: Payer: Self-pay | Admitting: Family Medicine

## 2015-11-20 ENCOUNTER — Ambulatory Visit
Admission: RE | Admit: 2015-11-20 | Discharge: 2015-11-20 | Disposition: A | Discharge status: Home / Self Care | Payer: Medicare Other | Source: Ambulatory Visit

## 2015-11-20 DIAGNOSIS — Z1231 Encounter for screening mammogram for malignant neoplasm of breast: Secondary | ICD-10-CM

## 2015-12-14 ENCOUNTER — Other Ambulatory Visit: Payer: Self-pay | Admitting: Family Medicine

## 2016-01-13 ENCOUNTER — Other Ambulatory Visit: Payer: Self-pay | Admitting: Family Medicine

## 2016-02-11 ENCOUNTER — Other Ambulatory Visit: Payer: Self-pay | Admitting: Family Medicine

## 2016-03-14 ENCOUNTER — Other Ambulatory Visit: Payer: Self-pay | Admitting: Family Medicine

## 2016-04-13 ENCOUNTER — Other Ambulatory Visit: Payer: Self-pay | Admitting: Family Medicine

## 2016-04-22 ENCOUNTER — Ambulatory Visit (INDEPENDENT_AMBULATORY_CARE_PROVIDER_SITE_OTHER): Payer: Medicare Other | Admitting: Family Medicine

## 2016-04-22 ENCOUNTER — Encounter: Payer: Self-pay | Admitting: Family Medicine

## 2016-04-22 VITALS — BP 130/67 | HR 82 | Temp 97.9°F | Resp 16 | Ht <= 58 in | Wt 120.0 lb

## 2016-04-22 DIAGNOSIS — I10 Essential (primary) hypertension: Secondary | ICD-10-CM | POA: Diagnosis not present

## 2016-04-22 DIAGNOSIS — Z131 Encounter for screening for diabetes mellitus: Secondary | ICD-10-CM | POA: Diagnosis not present

## 2016-04-22 DIAGNOSIS — Z23 Encounter for immunization: Secondary | ICD-10-CM

## 2016-04-22 LAB — CBC WITH DIFFERENTIAL/PLATELET
BASOS ABS: 68 {cells}/uL (ref 0–200)
Basophils Relative: 1 %
EOS ABS: 408 {cells}/uL (ref 15–500)
EOS PCT: 6 %
HCT: 37.6 % (ref 35.0–45.0)
HEMOGLOBIN: 12.3 g/dL (ref 11.7–15.5)
LYMPHS ABS: 1632 {cells}/uL (ref 850–3900)
Lymphocytes Relative: 24 %
MCH: 30.1 pg (ref 27.0–33.0)
MCHC: 32.7 g/dL (ref 32.0–36.0)
MCV: 92.2 fL (ref 80.0–100.0)
MONOS PCT: 5 %
MPV: 10.1 fL (ref 7.5–12.5)
Monocytes Absolute: 340 cells/uL (ref 200–950)
Neutro Abs: 4352 cells/uL (ref 1500–7800)
Neutrophils Relative %: 64 %
Platelets: 255 10*3/uL (ref 140–400)
RBC: 4.08 MIL/uL (ref 3.80–5.10)
RDW: 13.8 % (ref 11.0–15.0)
WBC: 6.8 10*3/uL (ref 3.8–10.8)

## 2016-04-22 LAB — HEMOGLOBIN A1C
Hgb A1c MFr Bld: 5.1 % (ref <5.7)
MEAN PLASMA GLUCOSE: 100 mg/dL

## 2016-04-22 NOTE — Patient Instructions (Signed)
Continue all the good things you are doing for your health.

## 2016-04-22 NOTE — Progress Notes (Signed)
Sarah Valenzuela, is a 82 y.o. female  WUJ:[id number]  [id number]  DOB - April 04, 1932  CC:  Chief Complaint  Patient presents with  . Hypertension  . Follow-up       HPI: Sarah Valenzuela is a 82 y.o. female here for follow-up chronic conditions. She has a history of  Allergies, asthma, hypertension, hypercholesterolemia. She reports doing well, without any major complaints today. She does not need refills today.  She is on Advair, Atrovent, Nasonex, Zyrtec, Accolate and maxide. She reports using her rescue inhaler very occasionally. She reports following a low carb, low fat diet and going to gym 3 times a week.   Health Maintenance: She is to receive flu shot today. Tdap is current. She had a mammogram in May. Has aged out of PAP and colonoscopy.   Allergies  Allergen Reactions  . Penicillins Other (See Comments)    Breaks out. Longtime allergy   Past Medical History:  Diagnosis Date  . Asthma   . Hypercholesteremia   . Hyperlipidemia   . Hypertension   . Situational stress    Current Outpatient Prescriptions on File Prior to Visit  Medication Sig Dispense Refill  . acetaminophen (TYLENOL) 500 MG tablet Take 1 tablet (500 mg total) by mouth every 6 (six) hours as needed for pain. 30 tablet 0  . ADVAIR DISKUS 100-50 MCG/DOSE AEPB USE 1 INHALATION TWICE DAILY. RINSE MOUTH AFTER USE. 60 each 0  . aspirin 81 MG chewable tablet Chew 81 mg by mouth daily.    . ATROVENT HFA 17 MCG/ACT inhaler USE 2 PUFFS 3 TIMES DAILY AS NEEDED FOR SHORTNESS OF BREATH. 25.8 g 0  . calcium citrate (CALCITRATE - DOSED IN MG ELEMENTAL CALCIUM) 950 MG tablet Take 1 tablet by mouth 2 (two) times daily.    . cetirizine (ZYRTEC) 10 MG tablet Take 10 mg by mouth daily.    . cycloSPORINE (RESTASIS) 0.05 % ophthalmic emulsion Place 1 drop into both eyes 2 (two) times daily.    . mometasone (NASONEX) 50 MCG/ACT nasal spray USE 2 SPRAYS IN EACH NOSTRIL ONCE A DAY. 17 g 0  . rosuvastatin (CRESTOR) 5 MG tablet  Take 5 mg by mouth daily.    . SENNOSIDES PO Take 8.5 mg by mouth as needed (Swiss Kriss OTC Laxative).    . triamterene-hydrochlorothiazide (MAXZIDE-25) 37.5-25 MG per tablet Take 0.5 tablets by mouth daily. 30 tablet 2  . Vaginal Moisturizer (VAGISIL FEMININE MOISTURIZER) LOTN Place vaginally 3 (three) times daily.    . zafirlukast (ACCOLATE) 20 MG tablet TAKE 1 TABLET TWICE DAILY. 60 tablet 0  . Vaginal Lubricant (REPLENS) GEL Place 1 application vaginally daily as needed. (Patient not taking: Reported on 04/22/2016) 6.7 g 1   No current facility-administered medications on file prior to visit.    Family History  Problem Relation Age of Onset  . Cancer Brother     Prostate   Social History   Social History  . Marital status: Widowed    Spouse name: N/A  . Number of children: N/A  . Years of education: N/A   Occupational History  . Not on file.   Social History Main Topics  . Smoking status: Never Smoker  . Smokeless tobacco: Never Used  . Alcohol use No  . Drug use: No  . Sexual activity: No   Other Topics Concern  . Not on file   Social History Narrative  . No narrative on file    Review of Systems: Constitutional: Negative Skin:  Negative HENT: Negative  Eyes: + age related decrease in vision and 'droopy' eyes.(per eye doctor).  Neck: Negative Respiratory: Negative Cardiovascular: Negative Gastrointestinal: Negative except for some constipation. She uses OTC stimulants Genitourinary: + frequency and urgency Musculoskeletal: Negative, except for minor right foot pain Neurological: Negative for Hematological: Negative  Psychiatric/Behavioral: minor depressive symptoms   Objective:   Vitals:   04/22/16 0955  BP: 130/67  Pulse: 82  Resp: 16  Temp: 97.9 F (36.6 C)    Physical Exam: Constitutional: Patient appears well-developed and well-nourished. No distress. HENT: Normocephalic, atraumatic, External right and left ear normal. Oropharynx is clear  and moist.  Eyes: Conjunctivae and EOM are normal. PERRLA, no scleral icterus. Neck: Normal ROM. Neck supple. No lymphadenopathy, No thyromegaly. CVS: RRR, S1/S2 +, no murmurs, no gallops, no rubs Pulmonary: Effort and breath sounds normal, no stridor, rhonchi, wheezes, rales.  Abdominal: Soft. Normoactive BS,, no distension, tenderness, rebound or guarding.  Musculoskeletal: Normal range of motion. No edema and no tenderness.  Neuro: Alert.Normal muscle tone coordination. Non-focal Skin: Skin is warm and dry. No rash noted. Not diaphoretic. No erythema. No pallor. Psychiatric: Normal mood and affect. Behavior, judgment, thought content normal.  Lab Results  Component Value Date   WBC 6.6 05/07/2014   HGB 13.1 05/07/2014   HCT 39.3 05/07/2014   MCV 92.0 05/07/2014   PLT 225 05/07/2014   Lab Results  Component Value Date   CREATININE 0.82 06/01/2015   BUN 8 06/01/2015   NA 135 06/01/2015   K 3.7 06/01/2015   CL 100 06/01/2015   CO2 27 06/01/2015    No results found for: HGBA1C Lipid Panel     Component Value Date/Time   CHOL 160 06/01/2015 0830   TRIG 42 06/01/2015 0830   HDL 99 06/01/2015 0830   CHOLHDL 1.6 06/01/2015 0830   VLDL 8 06/01/2015 0830   LDLCALC 53 06/01/2015 0830       Assessment and plan:   1. Need for prophylactic vaccination and inoculation against influenza  - Flu Vaccine QUAD 36+ mos PF IM (Fluarix & Fluzone Quad PF)  2. Screening for diabetes mellitus  - Hemoglobin A1c  3. Essential hypertension  - COMPLETE METABOLIC PANEL WITH GFR - CBC with Differential - Lipid panel   Return in about 6 months (around 10/21/2016).  The patient was given clear instructions to go to ER or return to medical center if symptoms don't improve, worsen or new problems develop. The patient verbalized understanding.    Henrietta Hoover FNP  04/22/2016, 11:11 AM

## 2016-04-23 LAB — LIPID PANEL
CHOL/HDL RATIO: 1.7 ratio (ref <=5.0)
CHOLESTEROL: 169 mg/dL (ref 125–200)
HDL: 99 mg/dL (ref >=46)
LDL Cholesterol: 62 mg/dL (ref <130)
Triglycerides: 39 mg/dL (ref <150)
VLDL: 8 mg/dL (ref <30)

## 2016-04-23 LAB — COMPLETE METABOLIC PANEL WITH GFR
ALBUMIN: 4.3 g/dL (ref 3.6–5.1)
ALK PHOS: 52 U/L (ref 33–130)
ALT: 13 U/L (ref 6–29)
AST: 26 U/L (ref 10–35)
BILIRUBIN TOTAL: 0.5 mg/dL (ref 0.2–1.2)
BUN: 9 mg/dL (ref 7–25)
CO2: 25 mmol/L (ref 20–31)
Calcium: 9.5 mg/dL (ref 8.6–10.4)
Chloride: 100 mmol/L (ref 98–110)
Creat: 0.75 mg/dL (ref 0.60–0.88)
GFR, Est African American: 86 mL/min (ref >=60)
GFR, Est Non African American: 74 mL/min (ref >=60)
GLUCOSE: 86 mg/dL (ref 65–99)
Potassium: 3.7 mmol/L (ref 3.5–5.3)
SODIUM: 137 mmol/L (ref 135–146)
TOTAL PROTEIN: 7.4 g/dL (ref 6.1–8.1)

## 2016-05-13 ENCOUNTER — Other Ambulatory Visit: Payer: Self-pay | Admitting: Family Medicine

## 2016-06-11 ENCOUNTER — Other Ambulatory Visit: Payer: Self-pay | Admitting: Family Medicine

## 2016-07-13 ENCOUNTER — Other Ambulatory Visit: Payer: Self-pay | Admitting: Family Medicine

## 2016-08-08 ENCOUNTER — Other Ambulatory Visit: Payer: Self-pay | Admitting: Family Medicine

## 2016-08-08 ENCOUNTER — Telehealth: Payer: Self-pay

## 2016-08-08 MED ORDER — FLUTICASONE PROPIONATE 50 MCG/ACT NA SUSP: 2.0000 | Freq: Every day | NASAL | 6 refills | Status: DC

## 2016-08-08 NOTE — Telephone Encounter (Signed)
Bonita QuinLinda please advise if something else could be sent in. Patient doesn't have a follow up until April.

## 2016-08-08 NOTE — Telephone Encounter (Signed)
What do they recommend?

## 2016-08-08 NOTE — Telephone Encounter (Signed)
Generic flonase

## 2016-08-15 ENCOUNTER — Other Ambulatory Visit: Payer: Self-pay | Admitting: Family Medicine

## 2016-09-12 ENCOUNTER — Other Ambulatory Visit: Payer: Self-pay | Admitting: Family Medicine

## 2016-10-13 ENCOUNTER — Other Ambulatory Visit: Payer: Self-pay | Admitting: Family Medicine

## 2016-10-21 ENCOUNTER — Ambulatory Visit (INDEPENDENT_AMBULATORY_CARE_PROVIDER_SITE_OTHER): Payer: Medicare Other | Admitting: Family Medicine

## 2016-10-21 ENCOUNTER — Other Ambulatory Visit (HOSPITAL_COMMUNITY)
Admission: RE | Admit: 2016-10-21 | Discharge: 2016-10-21 | Disposition: A | Discharge status: Home / Self Care | Payer: Medicare Other | Source: Ambulatory Visit | Attending: Family Medicine | Admitting: Family Medicine

## 2016-10-21 VITALS — BP 134/71 | HR 77 | Temp 97.9°F | Resp 14 | Ht <= 58 in | Wt 119.0 lb

## 2016-10-21 DIAGNOSIS — L298 Other pruritus: Secondary | ICD-10-CM | POA: Insufficient documentation

## 2016-10-21 DIAGNOSIS — R252 Cramp and spasm: Secondary | ICD-10-CM

## 2016-10-21 DIAGNOSIS — I10 Essential (primary) hypertension: Secondary | ICD-10-CM | POA: Diagnosis not present

## 2016-10-21 DIAGNOSIS — H6123 Impacted cerumen, bilateral: Secondary | ICD-10-CM | POA: Diagnosis not present

## 2016-10-21 DIAGNOSIS — N898 Other specified noninflammatory disorders of vagina: Secondary | ICD-10-CM

## 2016-10-21 DIAGNOSIS — J4520 Mild intermittent asthma, uncomplicated: Secondary | ICD-10-CM

## 2016-10-21 DIAGNOSIS — E785 Hyperlipidemia, unspecified: Secondary | ICD-10-CM

## 2016-10-21 LAB — COMPLETE METABOLIC PANEL WITHOUT GFR
AG Ratio: 1.4 ratio (ref 1.0–2.5)
ALT: 15 U/L (ref 6–29)
AST: 25 U/L (ref 10–35)
Albumin: 4.2 g/dL (ref 3.6–5.1)
Alkaline Phosphatase: 46 U/L (ref 33–130)
BUN/Creatinine Ratio: 11.3 ratio (ref 6–22)
BUN: 9 mg/dL (ref 7–25)
CO2: 26 mmol/L (ref 20–31)
Calcium: 9.5 mg/dL (ref 8.6–10.4)
Chloride: 101 mmol/L (ref 98–110)
Creat: 0.8 mg/dL (ref 0.60–0.88)
GFR, Est African American: 79 mL/min
GFR, Est Non African American: 69 mL/min
Globulin: 3.1 g/dL (ref 1.9–3.7)
Glucose, Bld: 82 mg/dL (ref 65–99)
Potassium: 3.9 mmol/L (ref 3.5–5.3)
Sodium: 138 mmol/L (ref 135–146)
Total Bilirubin: 0.5 mg/dL (ref 0.2–1.2)
Total Protein: 7.3 g/dL (ref 6.1–8.1)

## 2016-10-21 LAB — POCT URINALYSIS DIP (DEVICE)
BILIRUBIN URINE: NEGATIVE
Glucose, UA: NEGATIVE mg/dL
Ketones, ur: NEGATIVE mg/dL
LEUKOCYTES UA: NEGATIVE
Nitrite: NEGATIVE
PH: 8.5 — AB (ref 5.0–8.0)
Protein, ur: NEGATIVE mg/dL
SPECIFIC GRAVITY, URINE: 1.015 (ref 1.005–1.030)
Urobilinogen, UA: 0.2 mg/dL (ref 0.0–1.0)

## 2016-10-21 NOTE — Progress Notes (Signed)
Subjective:    Patient ID: Sarah Valenzuela, female    DOB: [birth year], 83 y.o.   MRN: [id number]  [name], pleasant 83 year old female presents for a 6 month follow up of chronic conditions. She maintains that she is doing well and has minimal complaints. She continues to exercise at the [facility] 3 times per week and stays active in her church.  She has a history of hypertension, asthma, and vaginal itching.   She says that she has not had an asthma exacerbation in several years. She uses Advair and takes Accolate consistently.  [name] is post menopausal and does not have vaginal bleeding. [name] is also complaining of increased vaginal dryness. 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 She states that vaginal dryness is uncomfortable. [name] is postmenpausal. She denies vaginal discharge, uterine bleeding, or dysuria.     Hypertension  The problem is controlled. Pertinent negatives include no anxiety, blurred vision, chest pain, malaise/fatigue, neck pain, orthopnea, palpitations, shortness of breath or sweats. Risk factors for coronary artery disease include post-menopausal state and dyslipidemia. The current treatment provides significant improvement. There are no compliance problems.   Vaginal Itching  The patient's primary symptoms include genital itching. The patient's pertinent negatives include no vaginal discharge. Primary symptoms comment: vaginal dryness. This is a chronic problem. The problem occurs intermittently. The problem has been gradually improving. The pain is mild. The problem affects both sides. Treatments tried: Replens pads. The treatment provided moderate relief. She is not sexually active.     Past Medical History:  Diagnosis Date  . Asthma   . Hypercholesteremia   . Hyperlipidemia   . Hypertension   . Situational stress    Social History   Social History  . Marital status: Widowed   Spouse name: N/A  . Number of children: N/A  . Years of education: N/A   Occupational History  . Not on file.   Social History Main Topics  . Smoking status: Never Smoker  . Smokeless tobacco: Never Used  . Alcohol use No  . Drug use: No  . Sexual activity: No   Other Topics Concern  . Not on file   Social History Narrative  . No narrative on file   Allergies  Allergen Reactions  . Penicillins Other (See Comments)    Breaks out. Longtime allergy   Review of Systems  Constitutional: Negative for malaise/fatigue.  HENT:       Decreased sense of smell  Eyes: Negative for blurred vision.       Dry eyes controlled by restasis and Systane  Respiratory: Negative for apnea, chest tightness and shortness of breath.   Cardiovascular: Negative.  Negative for chest pain, palpitations and orthopnea.  Endocrine: Negative.   Genitourinary: Negative for difficulty urinating, vaginal discharge and vaginal pain.       Vaginal dryness/vaginal itching  Musculoskeletal: Negative.  Negative for neck pain.  Skin: Negative.   Neurological: Negative for dizziness and tremors.  Hematological: Negative.        Objective:   Physical Exam  Constitutional: She is oriented to person, place, and time. She appears well-developed and well-nourished.  HENT:  Head: Normocephalic and atraumatic.  Right Ear: External ear normal.  Left Ear: External ear normal.  Mouth/Throat: Oropharynx is clear and moist.  Cerumen impaction  Eyes: Conjunctivae and EOM are normal. Pupils are equal, round, and reactive to light.  Neck: Normal range of  motion. Neck supple.  Cardiovascular: Normal rate, regular rhythm, normal heart sounds and intact distal pulses.   Abdominal: Soft. Bowel sounds are normal.  Musculoskeletal: Normal range of motion.  Neurological: She is alert and oriented to person, place, and time. She has normal reflexes.  Skin: Skin is warm and dry.  Psychiatric: She has a normal mood and affect.  Her behavior is normal. Judgment and thought content normal.   BP 134/71 (BP Location: Right Arm, Patient Position: Sitting, Cuff Size: Normal)   Pulse 77   Temp 97.9 F (36.6 C) (Oral)   Resp 14   Ht 4\' 9"  (1.448 m)   Wt 119 lb (54 kg)   SpO2 99%   BMI 25.75 kg/m     Assessment & Plan:  1. Essential hypertension Blood pressure is at goal on current medication regimen.  -Complete metabolic panel with GFR 2. Vaginal itching Will check for fungal elements or vaginal pathogens. Continue replens pads as directed.  - Cervicovaginal ancillary only  3. Mild intermittent asthma without complication Asthma symptoms controlled on current medication regimen  4. Leg cramps Will check potassium leve.  - COMPLETE METABOLIC PANEL WITH GFR  5. Hyperlipidemia, unspecified hyperlipidemia type The patient is asked to make an attempt to improve diet and exercise patterns to aid in medical management of this problem.  6. Bilateral impacted cerumen TM visualized after ear irrigation - Ear Lavage   RTC: 3 months for chronic conditions   Iara Monds Rennis Petty  MSN, FNP-C [facility] [address] [place], [place] [address] [phone]

## 2016-10-24 ENCOUNTER — Telehealth: Payer: Self-pay

## 2016-10-24 LAB — CERVICOVAGINAL ANCILLARY ONLY: Wet Prep (BD Affirm): NEGATIVE

## 2016-10-24 NOTE — Telephone Encounter (Signed)
Called and spoke with patient. She was complaining of some ear pain and not being able to hear well after she had her ears washed here on Friday. Since then she had taken some otc tylenol and the problem has resolved. I had advised her if any other problems to call our office. I also informed her of all blood work coming back within normal limits. Thanks!

## 2016-10-26 ENCOUNTER — Encounter: Payer: Self-pay | Admitting: Family Medicine

## 2016-10-26 NOTE — Patient Instructions (Addendum)
DASH Eating Plan DASH stands for "Dietary Approaches to Stop Hypertension." The DASH eating plan is a healthy eating plan that has been shown to reduce high blood pressure (hypertension). It may also reduce your risk for type 2 diabetes, heart disease, and stroke. The DASH eating plan may also help with weight loss. What are tips for following this plan? General guidelines  Avoid eating more than 2,300 mg (milligrams) of salt (sodium) a day. If you have hypertension, you may need to reduce your sodium intake to 1,500 mg a day.  Limit alcohol intake to no more than 1 drink a day for nonpregnant women and 2 drinks a day for men. One drink equals 12 oz of beer, 5 oz of wine, or 1 oz of hard liquor.  Work with your health care provider to maintain a healthy body weight or to lose weight. Ask what an ideal weight is for you.  Get at least 30 minutes of exercise that causes your heart to beat faster (aerobic exercise) most days of the week. Activities may include walking, swimming, or biking.  Work with your health care provider or diet and nutrition specialist (dietitian) to adjust your eating plan to your individual calorie needs. Reading food labels  Check food labels for the amount of sodium per serving. Choose foods with less than 5 percent of the Daily Value of sodium. Generally, foods with less than 300 mg of sodium per serving fit into this eating plan.  To find whole grains, look for the word "whole" as the first word in the ingredient list. Shopping  Buy products labeled as "low-sodium" or "no salt added."  Buy fresh foods. Avoid canned foods and premade or frozen meals. Cooking  Avoid adding salt when cooking. Use salt-free seasonings or herbs instead of table salt or sea salt. Check with your health care provider or pharmacist before using salt substitutes.  Do not fry foods. Cook foods using healthy methods such as baking, boiling, grilling, and broiling instead.  Cook with  heart-healthy oils, such as olive, canola, soybean, or sunflower oil. Meal planning   Eat a balanced diet that includes: ? 5 or more servings of fruits and vegetables each day. At each meal, try to fill half of your plate with fruits and vegetables. ? Up to 6-8 servings of whole grains each day. ? Less than 6 oz of lean meat, poultry, or fish each day. A 3-oz serving of meat is about the same size as a deck of cards. One egg equals 1 oz. ? 2 servings of low-fat dairy each day. ? A serving of nuts, seeds, or beans 5 times each week. ? Heart-healthy fats. Healthy fats called Omega-3 fatty acids are found in foods such as flaxseeds and coldwater fish, like sardines, salmon, and mackerel.  Limit how much you eat of the following: ? Canned or prepackaged foods. ? Food that is high in trans fat, such as fried foods. ? Food that is high in saturated fat, such as fatty meat. ? Sweets, desserts, sugary drinks, and other foods with added sugar. ? Full-fat dairy products.  Do not salt foods before eating.  Try to eat at least 2 vegetarian meals each week.  Eat more home-cooked food and less restaurant, buffet, and fast food.  When eating at a restaurant, ask that your food be prepared with less salt or no salt, if possible. What foods are recommended? The items listed may not be a complete list. Talk with your dietitian about what   dietary choices are best for you. Grains Whole-grain or whole-wheat bread. Whole-grain or whole-wheat pasta. Brown rice. Oatmeal. Quinoa. Bulgur. Whole-grain and low-sodium cereals. Pita bread. Low-fat, low-sodium crackers. Whole-wheat flour tortillas. Vegetables Fresh or frozen vegetables (raw, steamed, roasted, or grilled). Low-sodium or reduced-sodium tomato and vegetable juice. Low-sodium or reduced-sodium tomato sauce and tomato paste. Low-sodium or reduced-sodium canned vegetables. Fruits All fresh, dried, or frozen fruit. Canned fruit in natural juice (without  added sugar). Meat and other protein foods Skinless chicken or turkey. Ground chicken or turkey. Pork with fat trimmed off. Fish and seafood. Egg whites. Dried beans, peas, or lentils. Unsalted nuts, nut butters, and seeds. Unsalted canned beans. Lean cuts of beef with fat trimmed off. Low-sodium, lean deli meat. Dairy Low-fat (1%) or fat-free (skim) milk. Fat-free, low-fat, or reduced-fat cheeses. Nonfat, low-sodium ricotta or cottage cheese. Low-fat or nonfat yogurt. Low-fat, low-sodium cheese. Fats and oils Soft margarine without trans fats. Vegetable oil. Low-fat, reduced-fat, or light mayonnaise and salad dressings (reduced-sodium). Canola, safflower, olive, soybean, and sunflower oils. Avocado. Seasoning and other foods Herbs. Spices. Seasoning mixes without salt. Unsalted popcorn and pretzels. Fat-free sweets. What foods are not recommended? The items listed may not be a complete list. Talk with your dietitian about what dietary choices are best for you. Grains Baked goods made with fat, such as croissants, muffins, or some breads. Dry pasta or rice meal packs. Vegetables Creamed or fried vegetables. Vegetables in a cheese sauce. Regular canned vegetables (not low-sodium or reduced-sodium). Regular canned tomato sauce and paste (not low-sodium or reduced-sodium). Regular tomato and vegetable juice (not low-sodium or reduced-sodium). Pickles. Olives. Fruits Canned fruit in a light or heavy syrup. Fried fruit. Fruit in cream or butter sauce. Meat and other protein foods Fatty cuts of meat. Ribs. Fried meat. Bacon. Sausage. Bologna and other processed lunch meats. Salami. Fatback. Hotdogs. Bratwurst. Salted nuts and seeds. Canned beans with added salt. Canned or smoked fish. Whole eggs or egg yolks. Chicken or turkey with skin. Dairy Whole or 2% milk, cream, and half-and-half. Whole or full-fat cream cheese. Whole-fat or sweetened yogurt. Full-fat cheese. Nondairy creamers. Whipped toppings.  Processed cheese and cheese spreads. Fats and oils Butter. Stick margarine. Lard. Shortening. Ghee. Bacon fat. Tropical oils, such as coconut, palm kernel, or palm oil. Seasoning and other foods Salted popcorn and pretzels. Onion salt, garlic salt, seasoned salt, table salt, and sea salt. Worcestershire sauce. Tartar sauce. Barbecue sauce. Teriyaki sauce. Soy sauce, including reduced-sodium. Steak sauce. Canned and packaged gravies. Fish sauce. Oyster sauce. Cocktail sauce. Horseradish that you find on the shelf. Ketchup. Mustard. Meat flavorings and tenderizers. Bouillon cubes. Hot sauce and Tabasco sauce. Premade or packaged marinades. Premade or packaged taco seasonings. Relishes. Regular salad dressings. Where to find more information:  National Heart, Lung, and Blood Institute: www.nhlbi.nih.gov  American Heart Association: www.heart.org Summary  The DASH eating plan is a healthy eating plan that has been shown to reduce high blood pressure (hypertension). It may also reduce your risk for type 2 diabetes, heart disease, and stroke.  With the DASH eating plan, you should limit salt (sodium) intake to 2,300 mg a day. If you have hypertension, you may need to reduce your sodium intake to 1,500 mg a day.  When on the DASH eating plan, aim to eat more fresh fruits and vegetables, whole grains, lean proteins, low-fat dairy, and heart-healthy fats.  Work with your health care provider or diet and nutrition specialist (dietitian) to adjust your eating plan to your individual   calorie needs. This information is not intended to replace advice given to you by your health care provider. Make sure you discuss any questions you have with your health care provider. Document Released: 06/02/2011 Document Revised: 06/06/2016 Document Reviewed: 06/06/2016 Elsevier Interactive Patient Education  2017 Elsevier Inc.  

## 2016-11-10 ENCOUNTER — Other Ambulatory Visit: Payer: Self-pay | Admitting: Anesthesiology

## 2016-11-10 DIAGNOSIS — Z1231 Encounter for screening mammogram for malignant neoplasm of breast: Secondary | ICD-10-CM

## 2016-11-11 ENCOUNTER — Other Ambulatory Visit: Payer: Self-pay | Admitting: Family Medicine

## 2016-12-02 ENCOUNTER — Ambulatory Visit
Admission: RE | Admit: 2016-12-02 | Discharge: 2016-12-02 | Disposition: A | Discharge status: Home / Self Care | Payer: Medicare Other | Source: Ambulatory Visit | Attending: Anesthesiology | Admitting: Anesthesiology

## 2016-12-02 DIAGNOSIS — Z1231 Encounter for screening mammogram for malignant neoplasm of breast: Secondary | ICD-10-CM

## 2016-12-12 ENCOUNTER — Other Ambulatory Visit: Payer: Self-pay | Admitting: Family Medicine

## 2017-01-11 ENCOUNTER — Other Ambulatory Visit: Payer: Self-pay | Admitting: Family Medicine

## 2017-01-20 ENCOUNTER — Encounter: Payer: Self-pay | Admitting: Family Medicine

## 2017-01-20 ENCOUNTER — Ambulatory Visit (INDEPENDENT_AMBULATORY_CARE_PROVIDER_SITE_OTHER): Payer: Medicare Other | Admitting: Family Medicine

## 2017-01-20 VITALS — BP 128/80 | HR 81 | Temp 97.9°F | Resp 16 | Ht <= 58 in | Wt 120.0 lb

## 2017-01-20 DIAGNOSIS — I10 Essential (primary) hypertension: Secondary | ICD-10-CM | POA: Diagnosis not present

## 2017-01-20 DIAGNOSIS — N898 Other specified noninflammatory disorders of vagina: Secondary | ICD-10-CM | POA: Diagnosis not present

## 2017-01-20 DIAGNOSIS — E785 Hyperlipidemia, unspecified: Secondary | ICD-10-CM | POA: Diagnosis not present

## 2017-01-20 LAB — POCT URINALYSIS DIP (DEVICE)
Bilirubin Urine: NEGATIVE
GLUCOSE, UA: NEGATIVE mg/dL
KETONES UR: NEGATIVE mg/dL
Leukocytes, UA: NEGATIVE
Nitrite: NEGATIVE
PH: 7.5 (ref 5.0–8.0)
PROTEIN: NEGATIVE mg/dL
Specific Gravity, Urine: 1.015 (ref 1.005–1.030)
UROBILINOGEN UA: 0.2 mg/dL (ref 0.0–1.0)

## 2017-01-20 NOTE — Progress Notes (Signed)
Subjective:    Patient ID: Sarah Valenzuela, female    DOB: Jun 26, 1932, 83 y.o.   MRN: [id number]  Ms. Ameliana Aune, pleasant 83 year old female presents for a 6 month follow up of chronic conditions. She maintains that she is doing well and has minimal complaints. She continues to exercise at the [facility] 3 times per week and stays active in her church.  She has a history of hypertension, asthma, and vaginal dryness/itching.   She says that she has not had an asthma exacerbation in several years. She continues to use Advair daily.  Ms. Geffrard is post menopausal and denies not have vaginal bleeding. Ms. Tapani is also complaining of increased vaginal dryness. 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 She states that vaginal dryness is uncomfortable. She denies vaginal discharge, uterine bleeding, or dysuria. She has been tested for bacterial vaginosis and fungal elements. All tests have been negative.   Hypertension has been controlled on current medication regimen. She exercises routinely and follows a low salt diet. She denies heart palpitations, syncope, headaches, dizziness, or peripheral edema.       Past Medical History:  Diagnosis Date  . Asthma   . Hypercholesteremia   . Hyperlipidemia   . Hypertension   . Situational stress    Social History   Social History  . Marital status: Widowed    Spouse name: N/A  . Number of children: N/A  . Years of education: N/A   Occupational History  . Not on file.   Social History Main Topics  . Smoking status: Never Smoker  . Smokeless tobacco: Never Used  . Alcohol use No  . Drug use: No  . Sexual activity: No   Other Topics Concern  . Not on file   Social History Narrative  . No narrative on file   Allergies  Allergen Reactions  . Penicillins Other (See Comments)    Breaks out. Longtime allergy   Review of Systems  Constitutional: Negative for malaise/fatigue.  HENT:        Decreased sense of smell  Eyes: Negative for blurred vision.       Dry eyes controlled by restasis and Systane  Respiratory: Negative for apnea, chest tightness and shortness of breath.   Cardiovascular: Negative.  Negative for chest pain, palpitations and orthopnea.  Endocrine: Negative.   Genitourinary: Negative for difficulty urinating, vaginal discharge and vaginal pain.       Vaginal dryness/vaginal itching  Musculoskeletal: Negative.  Negative for neck pain.  Skin: Negative.   Neurological: Negative for dizziness and tremors.  Hematological: Negative.        Objective:   Physical Exam  Constitutional: She is oriented to person, place, and time. She appears well-developed and well-nourished.  HENT:  Head: Normocephalic and atraumatic.  Right Ear: External ear normal.  Left Ear: External ear normal.  Mouth/Throat: Oropharynx is clear and moist.  Eyes: Pupils are equal, round, and reactive to light. Conjunctivae and EOM are normal.  Neck: Normal range of motion. Neck supple.  Cardiovascular: Normal rate, regular rhythm, normal heart sounds and intact distal pulses.   Abdominal: Soft. Bowel sounds are normal.  Musculoskeletal: Normal range of motion.  Neurological: She is alert and oriented to person, place, and time. She has normal reflexes.  Skin: Skin is warm and dry.  Psychiatric: She has a normal mood and affect. Her behavior is normal. Judgment and thought content normal.   BP  128/80 (BP Location: Left Arm, Patient Position: Sitting, Cuff Size: Normal)   Pulse 81   Temp 97.9 F (36.6 C) (Oral)   Resp 16   Ht 4\' 9"  (1.448 m)   Wt 120 lb (54.4 kg)   SpO2 99%   BMI 25.97 kg/m     Assessment & Plan:  1. Essential hypertension Blood pressure is at goal on current medication regimen.  No proteinuria present. Will review renal functioning as results become available.  - BASIC METABOLIC PANEL WITH GFR  2. Hyperlipidemia, unspecified hyperlipidemia type The  patient is asked to make an attempt to improve diet and exercise patterns to aid in medical management of this problem.  - Lipid Panel  3. Vaginal dryness Patient has been prescribed several interventions to relieve vaginal dryness.  I will send a referral to gynecology for a routine gynecological evaluation.  - Ambulatory referral to Gynecology    RTC: 6 months for chronic conditions  Vallie Teters Rennis Petty  MSN, FNP-C [facility] Patient [facility] [address] [place], [place] [address] [phone]

## 2017-01-21 LAB — BASIC METABOLIC PANEL WITH GFR
BUN: 13 mg/dL (ref 7–25)
CO2: 24 mmol/L (ref 20–31)
CREATININE: 0.79 mg/dL (ref 0.60–0.88)
Calcium: 9.4 mg/dL (ref 8.6–10.4)
Chloride: 101 mmol/L (ref 98–110)
GFR, Est African American: 80 mL/min (ref >=60)
GFR, Est Non African American: 69 mL/min (ref >=60)
Glucose, Bld: 79 mg/dL (ref 65–99)
Potassium: 3.8 mmol/L (ref 3.5–5.3)
SODIUM: 139 mmol/L (ref 135–146)

## 2017-01-21 LAB — LIPID PANEL
CHOLESTEROL: 184 mg/dL (ref <200)
HDL: 97 mg/dL (ref >50)
LDL Cholesterol: 74 mg/dL (ref <100)
TRIGLYCERIDES: 66 mg/dL (ref <150)
Total CHOL/HDL Ratio: 1.9 Ratio (ref <5.0)
VLDL: 13 mg/dL (ref <30)

## 2017-01-23 ENCOUNTER — Telehealth: Payer: Self-pay

## 2017-01-23 NOTE — Telephone Encounter (Signed)
Called, no answer. Left message advising of normal labs and to keep next appointment. Asked to call if any questions and that we will send a copy of labs to her cardiologists. Thanks!

## 2017-01-23 NOTE — Telephone Encounter (Signed)
-----   Message from Massie MaroonLachina M Hollis, [place]NP sent at 01/22/2017  4:05 PM EDT ----- Regarding: lab results Please inform Ms. Fukuhara that all labs were within a normal range. Also, please fax labs to Dr. Lorenza EvangelistHarvani, cardiology.   Thanks ----- Message ----- From: Interface, Lab In Three Zero Five Sent: 01/21/2017  12:09 AM To: Massie MaroonLachina M Hollis, FNP

## 2017-02-13 ENCOUNTER — Other Ambulatory Visit: Payer: Self-pay | Admitting: Family Medicine

## 2017-02-13 ENCOUNTER — Ambulatory Visit (INDEPENDENT_AMBULATORY_CARE_PROVIDER_SITE_OTHER): Payer: Medicare Other | Admitting: Obstetrics & Gynecology

## 2017-02-13 ENCOUNTER — Encounter: Payer: Self-pay | Admitting: Obstetrics & Gynecology

## 2017-02-13 VITALS — BP 120/78 | Ht <= 58 in | Wt 120.0 lb

## 2017-02-13 DIAGNOSIS — Z01411 Encounter for gynecological examination (general) (routine) with abnormal findings: Secondary | ICD-10-CM

## 2017-02-13 DIAGNOSIS — Z780 Asymptomatic menopausal state: Secondary | ICD-10-CM | POA: Diagnosis not present

## 2017-02-13 DIAGNOSIS — Z124 Encounter for screening for malignant neoplasm of cervix: Secondary | ICD-10-CM

## 2017-02-13 DIAGNOSIS — N904 Leukoplakia of vulva: Secondary | ICD-10-CM

## 2017-02-13 MED ORDER — CLOBETASOL PROPIONATE 0.05 % EX OINT: 1.0000 "application " | TOPICAL_OINTMENT | Freq: Every day | CUTANEOUS | 4 refills | Status: AC

## 2017-02-13 NOTE — Patient Instructions (Signed)
1. Encounter for gynecological examination with abnormal finding Gyn exam with Vulvar Lichen Sclerosus.  Pap reflex done.  Breasts wnl.  Mammo neg 2018.  2. Menopause present No HRT.  No PMB.  Replens moisturizer as needed for dryness.  Vit D supplement, Ca++ in nutrition.  Continue weight bearing exercises.  F/U Bone density. - DG Bone Density; Future  3. Lichen sclerosus et atrophicus of the vulva Lichen sclerosus of Vulva.  Information given.  Benign chronic skin condition.  Start Clobetasol 0.05% ointment every day x 2 weeks, then continue thin vulvar applications of involved skin twice a week for long term control of the disorder.  Sarah Valenzuela, it was a pleasure to meet you today!  I will inform you of your results as soon as available.   Lichen Sclerosus Lichen sclerosus is a skin problem. It can happen on any part of the body, but it commonly involves the anal or genital areas. It can cause itching and discomfort in these areas. Treatment can help to control symptoms. When the genital area is affected, getting treatment is important because the condition can cause scarring that may lead to other problems. What are the causes? The cause of this condition is not known. It could be the result of an overactive immune system or a lack of certain hormones. Lichen sclerosus is not an infection or a fungus. It is not passed from one person to another (not contagious). What increases the risk? This condition is more likely to develop in women, usually after menopause. What are the signs or symptoms? Symptoms of this condition include:  Thin, wrinkled, white areas on the skin.  Thickened white areas on the skin.  Red and swollen patches (lesions) on the skin.  Tears or cracks in the skin.  Bruising.  Blood blisters.  Severe itching.  You may also have pain, itching, or burning with urination. Constipation is also common in people with lichen sclerosus. How is this diagnosed? This  condition may be diagnosed with a physical exam. In some cases, a tissue sample (biopsy sample) may be removed to be looked at under a microscope. How is this treated? This condition is usually treated with medicated creams or ointments (topical steroids) that are applied over the affected areas. Follow these instructions at home:  Take over-the-counter and prescription medicines only as told by your health care provider.  Use creams or ointments as told by your health care provider.  Do not scratch the affected areas of skin.  Women should keep the vaginal area as clean and dry as possible.  Keep all follow-up visits as told by your health care provider. This is important. Contact a health care provider if:  You have increasing redness, swelling, or pain in the affected area.  You have fluid, blood, or pus coming from the affected area.  You have new lesions on your skin.  You have a fever.  You have pain during sex. This information is not intended to replace advice given to you by your health care provider. Make sure you discuss any questions you have with your health care provider. Document Released: 11/03/2010 Document Revised: 11/19/2015 Document Reviewed: 09/08/2014 Elsevier Interactive Patient Education  [employer].

## 2017-02-13 NOTE — Addendum Note (Signed)
Addended by: Berna Spare A on: 02/13/2017 11:26 AM   Modules accepted: Orders

## 2017-02-13 NOTE — Progress Notes (Signed)
Sarah Valenzuela 08-28-1931 [id number]   History:    83 y.o.  G1P1  Widowed x 5 years.  Originally from [place].  RP:  New patient presenting for annual gyn exam   HPI:  Menopause.  No HRT.  C/O Vaginal/Vulvar dryness and itching at times.  No increase in vaginal discharge.  No PMB.  No pelvic pain.  Breasts wnl.  Mictions/BMs wnl.  Goes to the [facility] 3x per week.  Very active with church.    Past medical history,surgical history, family history and social history were all reviewed and documented in the EPIC chart.  Gynecologic History No LMP recorded. Patient is postmenopausal. Contraception: post menopausal status, abstinence Last Pap: ?Marland Kitchen Results were: Normal per patient Last mammogram: 2018. Results were: normal  Obstetric History OB History  Gravida Para Term Preterm AB Living  1 1       1   SAB TAB Ectopic Multiple Live Births               # Outcome Date GA Lbr Len/2nd Weight Sex Delivery Anes PTL Lv  1 Para                ROS: A ROS was performed and pertinent positives and negatives are included in the history.  GENERAL: No fevers or chills. HEENT: No change in vision, no earache, sore throat or sinus congestion. NECK: No pain or stiffness. CARDIOVASCULAR: No chest pain or pressure. No palpitations. PULMONARY: No shortness of breath, cough or wheeze. GASTROINTESTINAL: No abdominal pain, nausea, vomiting or diarrhea, melena or bright red blood per rectum. GENITOURINARY: No urinary frequency, urgency, hesitancy or dysuria. MUSCULOSKELETAL: No joint or muscle pain, no back pain, no recent trauma. DERMATOLOGIC: No rash, no itching, no lesions. ENDOCRINE: No polyuria, polydipsia, no heat or cold intolerance. No recent change in weight. HEMATOLOGICAL: No anemia or easy bruising or bleeding. NEUROLOGIC: No headache, seizures, numbness, tingling or weakness. PSYCHIATRIC: No depression, no loss of interest in normal activity or change in sleep pattern.     Exam:   BP 120/78   Ht  4' 9.5" (1.461 m)   Wt 120 lb (54.4 kg)   BMI 25.52 kg/m   Body mass index is 25.52 kg/m.  General appearance : Well developed well nourished female. No acute distress HEENT: Eyes: no retinal hemorrhage or exudates,  Neck supple, trachea midline, no carotid bruits, no thyroidmegaly Lungs: Clear to auscultation, no rhonchi or wheezes, or rib retractions  Heart: Regular rate and rhythm, no murmurs or gallops Breast:Examined in sitting and supine position were symmetrical in appearance, no palpable masses or tenderness,  no skin retraction, no nipple inversion, no nipple discharge, no skin discoloration, no axillary or supraclavicular lymphadenopathy Abdomen: no palpable masses or tenderness, no rebound or guarding Extremities: no edema or skin discoloration or tenderness  Pelvic: Vulva:  Inflammation, atrophy and depigmentation c/w Lichen Sclerosus  Bartholin, Urethra, Skene Glands: Within normal limits             Vagina: No gross lesions or discharge  Cervix: No gross lesions or discharge.  Pap reflex done.  Uterus  AV, normal size, shape and consistency, non-tender and mobile  Adnexa  Without masses or tenderness  Anus and perineum  normal    Assessment/Plan:  83 y.o. female for annual exam   1. Encounter for gynecological examination with abnormal finding Gyn exam with Vulvar Lichen Sclerosus.  Pap reflex done.  Breasts wnl.  Mammo neg 2018.  2. Menopause present  No HRT.  No PMB.  Replens moisturizer as needed for dryness.  Vit D supplement, Ca++ in nutrition.  Continue weight bearing exercises.  F/U Bone density. - DG Bone Density; Future  3. Lichen sclerosus et atrophicus of the vulva Lichen sclerosus of Vulva.  Information given.  Benign chronic skin condition.  Start Clobetasol 0.05% ointment every day x 2 weeks, then continue thin vulvar applications of involved skin twice a week for long term control of the disorder.  Counseling on above issues >50% x 20  minutes.  Genia Del MD, 10:44 AM 02/13/2017

## 2017-02-14 LAB — PAP IG W/ RFLX HPV ASCU

## 2017-02-23 ENCOUNTER — Other Ambulatory Visit: Payer: Self-pay | Admitting: Gynecology

## 2017-02-23 DIAGNOSIS — Z780 Asymptomatic menopausal state: Secondary | ICD-10-CM

## 2017-02-25 DIAGNOSIS — M810 Age-related osteoporosis without current pathological fracture: Secondary | ICD-10-CM

## 2017-02-25 HISTORY — DX: Age-related osteoporosis without current pathological fracture: M81.0

## 2017-03-06 ENCOUNTER — Ambulatory Visit (INDEPENDENT_AMBULATORY_CARE_PROVIDER_SITE_OTHER): Payer: Medicare Other

## 2017-03-06 ENCOUNTER — Other Ambulatory Visit: Payer: Self-pay | Admitting: Gynecology

## 2017-03-06 DIAGNOSIS — M810 Age-related osteoporosis without current pathological fracture: Secondary | ICD-10-CM

## 2017-03-06 DIAGNOSIS — Z780 Asymptomatic menopausal state: Secondary | ICD-10-CM | POA: Diagnosis not present

## 2017-03-07 ENCOUNTER — Encounter: Payer: Self-pay | Admitting: Gynecology

## 2017-03-07 ENCOUNTER — Telehealth: Payer: Self-pay | Admitting: Gynecology

## 2017-03-07 NOTE — Telephone Encounter (Signed)
Attempted to call patient home number rang, voicemail started but then line disconnected. Will try later.

## 2017-03-07 NOTE — Telephone Encounter (Signed)
Tell patient her bone density shows osteoporosis.  Recommend office visit with Dr Lavoie to discuss treatment options. 

## 2017-03-13 NOTE — Telephone Encounter (Signed)
Pt informed, states she will call back to schedule.  

## 2017-03-14 ENCOUNTER — Other Ambulatory Visit: Payer: Self-pay | Admitting: Family Medicine

## 2017-03-28 ENCOUNTER — Ambulatory Visit: Payer: Medicare Other | Admitting: Gynecology

## 2017-04-04 ENCOUNTER — Ambulatory Visit (INDEPENDENT_AMBULATORY_CARE_PROVIDER_SITE_OTHER): Payer: Medicare Other | Admitting: Obstetrics & Gynecology

## 2017-04-04 DIAGNOSIS — M810 Age-related osteoporosis without current pathological fracture: Secondary | ICD-10-CM

## 2017-04-04 NOTE — Progress Notes (Signed)
    Sarah Valenzuela 12/17/1931 [id number]        83 y.o.  G1P1 Widowed x 5 years.  Originally from [place].  RP:  Discuss Bone Density results  HPI:  No change x last visit:  Menopause.  No HRT.  C/O Vaginal/Vulvar dryness and itching at times.  No increase in vaginal discharge.  No PMB.  No pelvic pain.  Breasts wnl.  Mictions/BMs wnl.  Goes to the [facility] 3x per week.  Very active with church.     Past medical history,surgical history, problem list, medications, allergies, family history and social history were all reviewed and documented in the EPIC chart.  Directed ROS with pertinent positives and negatives documented in the history of present illness/assessment and plan.  Exam:  There were no vitals filed for this visit. General appearance:  Normal  Bone Density 03/06/2017:  Osteoporosis T score -3.1 at Lumbar spine (L1-L4).  Osteopenia otherwise, lowest at Rt Femoral Neck T score -2.4.  Stable since Bone density 11/2014 with Lumbar spine at -3.1.  Assessment/Plan:  83 y.o. G1P1   1. Age-related osteoporosis without current pathological fracture Very active 83 yo on Vit D supplement and Ca++ rich diet.  Weight bearing physical activity >5 times a week.  Given the stability of her Bone Density since 2016, her good life habits and her age, decision not to start on Prolia at patient's preference.  Risks/benefits thoroughly discussed.  Will repeat Bone density in 2 years.  Patient will call if changes her mind about treatment.  Counseling on above issues >50% x 25 minutes.  Genia Del MD, 12:47 PM 04/04/2017

## 2017-04-08 NOTE — Patient Instructions (Signed)
1. Age-related osteoporosis without current pathological fracture Very active 83 yo on Vit D supplement and Ca++ rich diet.  Weight bearing physical activity >5 times a week.  Given the stability of her Bone Density since 2016, her good life habits and her age, decision not to start on Prolia at patient's preference.  Risks/benefits thoroughly discussed.  Will repeat Bone density in 2 years.  Patient will call if changes her mind about treatment.  Good to see you today, Sarah Valenzuela!   Osteoporosis Osteoporosis is the thinning and loss of density in the bones. Osteoporosis makes the bones more brittle, fragile, and likely to break (fracture). Over time, osteoporosis can cause the bones to become so weak that they fracture after a simple fall. The bones most likely to fracture are the bones in the hip, wrist, and spine. What are the causes? The exact cause is not known. What increases the risk? Anyone can develop osteoporosis. You may be at greater risk if you have a family history of the condition or have poor nutrition. You may also have a higher risk if you are:  Female.  77 years old or older.  A smoker.  Not physically active.  White or Asian.  Slender.  What are the signs or symptoms? A fracture might be the first sign of the disease, especially if it results from a fall or injury that would not usually cause a bone to break. Other signs and symptoms include:  Low back and neck pain.  Stooped posture.  Height loss.  How is this diagnosed? To make a diagnosis, your health care provider may:  Take a medical history.  Perform a physical exam.  Order tests, such as: ? A bone mineral density test. ? A dual-energy X-ray absorptiometry test.  How is this treated? The goal of osteoporosis treatment is to strengthen your bones to reduce your risk of a fracture. Treatment may involve:  Making lifestyle changes, such as: ? Eating a diet rich in calcium. ? Doing weight-bearing and  muscle-strengthening exercises. ? Stopping tobacco use. ? Limiting alcohol intake.  Taking medicine to slow the process of bone loss or to increase bone density.  Monitoring your levels of calcium and vitamin D.  Follow these instructions at home:  Include calcium and vitamin D in your diet. Calcium is important for bone health, and vitamin D helps the body absorb calcium.  Perform weight-bearing and muscle-strengthening exercises as directed by your health care provider.  Do not use any tobacco products, including cigarettes, chewing tobacco, and electronic cigarettes. If you need help quitting, ask your health care provider.  Limit your alcohol intake.  Take medicines only as directed by your health care provider.  Keep all follow-up visits as directed by your health care provider. This is important.  Take precautions at home to lower your risk of falling, such as: ? Keeping rooms well lit and clutter free. ? Installing safety rails on stairs. ? Using rubber mats in the bathroom and other areas that are often wet or slippery. Get help right away if: You fall or injure yourself. This information is not intended to replace advice given to you by your health care provider. Make sure you discuss any questions you have with your health care provider. Document Released: 03/23/2005 Document Revised: 11/16/2015 Document Reviewed: 11/21/2013 Elsevier Interactive Patient Education  2017 [employer].

## 2017-04-13 ENCOUNTER — Other Ambulatory Visit: Payer: Self-pay | Admitting: Family Medicine

## 2017-05-10 ENCOUNTER — Ambulatory Visit: Payer: Medicare Other | Admitting: Family Medicine

## 2017-05-10 ENCOUNTER — Encounter: Payer: Self-pay | Admitting: Family Medicine

## 2017-05-10 VITALS — BP 137/64 | HR 80 | Temp 97.5°F | Resp 14 | Ht <= 58 in | Wt 119.0 lb

## 2017-05-10 DIAGNOSIS — I10 Essential (primary) hypertension: Secondary | ICD-10-CM | POA: Diagnosis not present

## 2017-05-10 DIAGNOSIS — E785 Hyperlipidemia, unspecified: Secondary | ICD-10-CM

## 2017-05-10 LAB — COMPLETE METABOLIC PANEL WITH GFR
AG RATIO: 1.5 (calc) (ref 1.0–2.5)
ALBUMIN MSPROF: 4.3 g/dL (ref 3.6–5.1)
ALT: 13 U/L (ref 6–29)
AST: 23 U/L (ref 10–35)
Alkaline phosphatase (APISO): 53 U/L (ref 33–130)
BUN: 9 mg/dL (ref 7–25)
CALCIUM: 9.4 mg/dL (ref 8.6–10.4)
CO2: 28 mmol/L (ref 20–32)
Chloride: 100 mmol/L (ref 98–110)
Creat: 0.74 mg/dL (ref 0.60–0.88)
GFR, EST NON AFRICAN AMERICAN: 75 mL/min/{1.73_m2} (ref >=60)
GFR, Est African American: 87 mL/min/{1.73_m2} (ref >=60)
GLOBULIN: 2.8 g/dL (ref 1.9–3.7)
Glucose, Bld: 89 mg/dL (ref 65–99)
POTASSIUM: 3.7 mmol/L (ref 3.5–5.3)
SODIUM: 137 mmol/L (ref 135–146)
Total Bilirubin: 0.5 mg/dL (ref 0.2–1.2)
Total Protein: 7.1 g/dL (ref 6.1–8.1)

## 2017-05-10 LAB — POCT URINALYSIS DIP (DEVICE)
Bilirubin Urine: NEGATIVE
Glucose, UA: NEGATIVE mg/dL
HGB URINE DIPSTICK: NEGATIVE
KETONES UR: NEGATIVE mg/dL
LEUKOCYTES UA: NEGATIVE
Nitrite: NEGATIVE
PROTEIN: NEGATIVE mg/dL
SPECIFIC GRAVITY, URINE: 1.02 (ref 1.005–1.030)
UROBILINOGEN UA: 0.2 mg/dL (ref 0.0–1.0)
pH: 8.5 — ABNORMAL HIGH (ref 5.0–8.0)

## 2017-05-10 NOTE — Progress Notes (Signed)
Subjective:    Patient ID: Sarah Valenzuela, female    DOB: [birth year], 83 y.o.   MRN: [id number]  Ms. Loresa Haapala, pleasant 83 year old female presents for a 6 month follow up of chronic conditions. She maintains that she is doing well and is without complaint.. She continues to exercise at the [facility] 3 times per week and stays active in her church   She says that she has not had an asthma exacerbation in several years. She continues to use Advair daily.  Hypertension has been controlled on current medication regimen. She exercises routinely and follows a low salt diet. She denies heart palpitations, syncope, headaches, dizziness, or peripheral edema.     Hypertension  This is a chronic problem. The problem is controlled. Pertinent negatives include no anxiety, blurred vision, chest pain, malaise/fatigue, neck pain, orthopnea, palpitations, shortness of breath or sweats. There are no associated agents to hypertension. Risk factors for coronary artery disease include dyslipidemia. The current treatment provides significant improvement.  Hyperlipidemia  The problem is controlled. Recent lipid tests were reviewed and are normal. Pertinent negatives include no chest pain or shortness of breath. The current treatment provides significant improvement of lipids.     Past Medical History:  Diagnosis Date  . Asthma   . Hypercholesteremia   . Hyperlipidemia   . Hypertension   . Osteoporosis 02/2017   T score -3.1  . Situational stress    Social History   Socioeconomic History  . Marital status: Widowed    Spouse name: Not on file  . Number of children: Not on file  . Years of education: Not on file  . Highest education level: Not on file  Social Needs  . Financial resource strain: Not on file  . Food insecurity - worry: Not on file  . Food insecurity - inability: Not on file  . Transportation needs - medical: Not on file  . Transportation needs - non-medical: Not on file   Occupational History  . Not on file  Tobacco Use  . Smoking status: Never Smoker  . Smokeless tobacco: Never Used  Substance and Sexual Activity  . Alcohol use: No  . Drug use: No  . Sexual activity: No  Other Topics Concern  . Not on file  Social History Narrative  . Not on file   Allergies  Allergen Reactions  . Penicillins Other (See Comments)    Breaks out. Longtime allergy   Review of Systems  Constitutional: Negative for malaise/fatigue.  Eyes: Negative for blurred vision.       Dry eyes controlled by restasis and Systane  Respiratory: Negative for apnea, chest tightness and shortness of breath.   Cardiovascular: Negative.  Negative for chest pain, palpitations and orthopnea.  Endocrine: Negative.   Genitourinary: Negative for difficulty urinating, vaginal discharge and vaginal pain.  Musculoskeletal: Negative.  Negative for neck pain.  Skin: Negative.   Neurological: Negative for dizziness and tremors.  Hematological: Negative.        Objective:   Physical Exam  Constitutional: She is oriented to person, place, and time. She appears well-developed and well-nourished.  HENT:  Head: Normocephalic and atraumatic.  Right Ear: External ear normal.  Left Ear: External ear normal.  Mouth/Throat: Oropharynx is clear and moist.  Eyes: Conjunctivae and EOM are normal. Pupils are equal, round, and reactive to light.  Neck: Normal range of motion. Neck supple.  Cardiovascular: Normal rate, regular rhythm, normal heart sounds and intact distal pulses.  Abdominal: Soft.  Bowel sounds are normal.  Musculoskeletal: Normal range of motion.  Neurological: She is alert and oriented to person, place, and time. She has normal reflexes.  Skin: Skin is warm and dry.  Psychiatric: She has a normal mood and affect. Her behavior is normal. Judgment and thought content normal.   BP 137/64 (BP Location: Left Arm, Patient Position: Sitting, Cuff Size: Normal)   Pulse 80   Temp (!)  97.5 F (36.4 C) (Oral)   Resp 14   Ht 4\' 9"  (1.448 m)   Wt 119 lb (54 kg)   SpO2 99%   BMI 25.75 kg/m     Assessment & Plan:  1. Essential hypertension Blood pressure is at goal on current medication regimen.  No changes warranted at this time.  Continue DASH diet and exercise routine. - POCT urinalysis dip (device) - COMPLETE METABOLIC PANEL WITH GFR  2. Hyperlipidemia, unspecified hyperlipidemia type Recommend a lowfat, low carbohydrate diet divided over 5-6 small meals, increase water intake to 6-8 glasses, and  increase daily activity level.  - Lipid Panel; Future   RTC: 6 months for Medicare Wellness Visit   Nolon Nations  MSN, FNP-C Patient [facility] [address] [place], [place] [address] [phone]

## 2017-05-11 ENCOUNTER — Telehealth: Payer: Self-pay

## 2017-05-11 NOTE — Telephone Encounter (Signed)
-----   Message from Massie MaroonLachina M Hollis, [place] sent at 05/11/2017  1:09 PM EST ----- Regarding: lab results Please fax labs to Dr. Sharyn LullHarwani, cardiology.   Thanks

## 2017-05-11 NOTE — Telephone Encounter (Signed)
Faxed in labs to dr. Sharyn LullHarwani today 05/11/2017 @1 :32pm. Thanks!

## 2017-05-13 ENCOUNTER — Other Ambulatory Visit: Payer: Self-pay | Admitting: Family Medicine

## 2017-05-14 NOTE — Patient Instructions (Addendum)
Your blood pressure is at goal on current medication regimen no changes warranted on today.  We will recheck fasting cholesterol panel in 3 months.  Return in 6 months for Medicare wellness visit. DASH Eating Plan DASH stands for "Dietary Approaches to Stop Hypertension." The DASH eating plan is a healthy eating plan that has been shown to reduce high blood pressure (hypertension). It may also reduce your risk for type 2 diabetes, heart disease, and stroke. The DASH eating plan may also help with weight loss. What are tips for following this plan? General guidelines  Avoid eating more than 2,300 mg (milligrams) of salt (sodium) a day. If you have hypertension, you may need to reduce your sodium intake to 1,500 mg a day.  Limit alcohol intake to no more than 1 drink a day for nonpregnant women and 2 drinks a day for men. One drink equals 12 oz of beer, 5 oz of wine, or 1 oz of hard liquor.  Work with your health care provider to maintain a healthy body weight or to lose weight. Ask what an ideal weight is for you.  Get at least 30 minutes of exercise that causes your heart to beat faster (aerobic exercise) most days of the week. Activities may include walking, swimming, or biking.  Work with your health care provider or diet and nutrition specialist (dietitian) to adjust your eating plan to your individual calorie needs. Reading food labels  Check food labels for the amount of sodium per serving. Choose foods with less than 5 percent of the Daily Value of sodium. Generally, foods with less than 300 mg of sodium per serving fit into this eating plan.  To find whole grains, look for the word "whole" as the first word in the ingredient list. Shopping  Buy products labeled as "low-sodium" or "no salt added."  Buy fresh foods. Avoid canned foods and premade or frozen meals. Cooking  Avoid adding salt when cooking. Use salt-free seasonings or herbs instead of table salt or sea salt. Check with  your health care provider or pharmacist before using salt substitutes.  Do not fry foods. Cook foods using healthy methods such as baking, boiling, grilling, and broiling instead.  Cook with heart-healthy oils, such as olive, canola, soybean, or sunflower oil. Meal planning   Eat a balanced diet that includes: ? 5 or more servings of fruits and vegetables each day. At each meal, try to fill half of your plate with fruits and vegetables. ? Up to 6-8 servings of whole grains each day. ? Less than 6 oz of lean meat, poultry, or fish each day. A 3-oz serving of meat is about the same size as a deck of cards. One egg equals 1 oz. ? 2 servings of low-fat dairy each day. ? A serving of nuts, seeds, or beans 5 times each week. ? Heart-healthy fats. Healthy fats called Omega-3 fatty acids are found in foods such as flaxseeds and coldwater fish, like sardines, salmon, and mackerel.  Limit how much you eat of the following: ? Canned or prepackaged foods. ? Food that is high in trans fat, such as fried foods. ? Food that is high in saturated fat, such as fatty meat. ? Sweets, desserts, sugary drinks, and other foods with added sugar. ? Full-fat dairy products.  Do not salt foods before eating.  Try to eat at least 2 vegetarian meals each week.  Eat more home-cooked food and less restaurant, buffet, and fast food.  When eating at a  restaurant, ask that your food be prepared with less salt or no salt, if possible. What foods are recommended? The items listed may not be a complete list. Talk with your dietitian about what dietary choices are best for you. Grains Whole-grain or whole-wheat bread. Whole-grain or whole-wheat pasta. Brown rice. [place]ena Morrow. Bulgur. Whole-grain and low-sodium cereals. Pita bread. Low-fat, low-sodium crackers. Whole-wheat flour tortillas. Vegetables Fresh or frozen vegetables (raw, steamed, roasted, or grilled). Low-sodium or reduced-sodium tomato and vegetable  juice. Low-sodium or reduced-sodium tomato sauce and tomato paste. Low-sodium or reduced-sodium canned vegetables. Fruits All fresh, dried, or frozen fruit. Canned fruit in natural juice (without added sugar). Meat and other protein foods Skinless chicken or [place]. Ground chicken or [place]. Pork with fat trimmed off. Fish and seafood. Egg whites. Dried beans, peas, or lentils. Unsalted nuts, nut butters, and seeds. Unsalted canned beans. Lean cuts of beef with fat trimmed off. Low-sodium, lean deli meat. Dairy Low-fat (1%) or fat-free (skim) milk. Fat-free, low-fat, or reduced-fat cheeses. Nonfat, low-sodium ricotta or cottage cheese. Low-fat or nonfat yogurt. Low-fat, low-sodium cheese. Fats and oils Soft margarine without trans fats. Vegetable oil. Low-fat, reduced-fat, or light mayonnaise and salad dressings (reduced-sodium). Canola, safflower, olive, soybean, and sunflower oils. Avocado. Seasoning and other foods Herbs. Spices. Seasoning mixes without salt. Unsalted popcorn and pretzels. Fat-free sweets. What foods are not recommended? The items listed may not be a complete list. Talk with your dietitian about what dietary choices are best for you. Grains Baked goods made with fat, such as croissants, muffins, or some breads. Dry pasta or rice meal packs. Vegetables Creamed or fried vegetables. Vegetables in a cheese sauce. Regular canned vegetables (not low-sodium or reduced-sodium). Regular canned tomato sauce and paste (not low-sodium or reduced-sodium). Regular tomato and vegetable juice (not low-sodium or reduced-sodium). Angie Fava. Olives. Fruits Canned fruit in a light or heavy syrup. Fried fruit. Fruit in cream or butter sauce. Meat and other protein foods Fatty cuts of meat. Ribs. Fried meat. Berniece Salines. Sausage. Bologna and other processed lunch meats. Salami. Fatback. Hotdogs. Bratwurst. Salted nuts and seeds. Canned beans with added salt. Canned or smoked fish. Whole eggs or egg yolks.  Chicken or [place] with skin. Dairy Whole or 2% milk, cream, and half-and-half. Whole or full-fat cream cheese. Whole-fat or sweetened yogurt. Full-fat cheese. Nondairy creamers. Whipped toppings. Processed cheese and cheese spreads. Fats and oils Butter. Stick margarine. Lard. Shortening. Ghee. Bacon fat. Tropical oils, such as coconut, palm kernel, or palm oil. Seasoning and other foods Salted popcorn and pretzels. Onion salt, garlic salt, seasoned salt, table salt, and sea salt. Worcestershire sauce. Tartar sauce. Barbecue sauce. Teriyaki sauce. Soy sauce, including reduced-sodium. Steak sauce. Canned and packaged gravies. Fish sauce. Oyster sauce. Cocktail sauce. Horseradish that you find on the shelf. Ketchup. Mustard. Meat flavorings and tenderizers. Bouillon cubes. Hot sauce and Tabasco sauce. Premade or packaged marinades. Premade or packaged taco seasonings. Relishes. Regular salad dressings. Where to find more information:  National Heart, Lung, and [place]: https://wilson-eaton.com/  American Heart Association: www.heart.org Summary  The DASH eating plan is a healthy eating plan that has been shown to reduce high blood pressure (hypertension). It may also reduce your risk for type 2 diabetes, heart disease, and stroke.  With the DASH eating plan, you should limit salt (sodium) intake to 2,300 mg a day. If you have hypertension, you may need to reduce your sodium intake to 1,500 mg a day.  When on the DASH eating plan, aim to eat more  fresh fruits and vegetables, whole grains, lean proteins, low-fat dairy, and heart-healthy fats.  Work with your health care provider or diet and nutrition specialist (dietitian) to adjust your eating plan to your individual calorie needs. This information is not intended to replace advice given to you by your health care provider. Make sure you discuss any questions you have with your health care provider. Document Released: 06/02/2011 Document Revised:  06/06/2016 Document Reviewed: 06/06/2016 Elsevier Interactive Patient Education  2017 [employer].

## 2017-05-15 ENCOUNTER — Other Ambulatory Visit: Payer: Self-pay | Admitting: Family Medicine

## 2017-06-13 ENCOUNTER — Other Ambulatory Visit: Payer: Self-pay | Admitting: Family Medicine

## 2017-07-13 ENCOUNTER — Other Ambulatory Visit: Payer: Self-pay | Admitting: Family Medicine

## 2017-08-10 ENCOUNTER — Other Ambulatory Visit: Payer: Medicare Other

## 2017-08-10 DIAGNOSIS — E785 Hyperlipidemia, unspecified: Secondary | ICD-10-CM

## 2017-08-11 ENCOUNTER — Telehealth: Payer: Self-pay

## 2017-08-11 LAB — LIPID PANEL
CHOL/HDL RATIO: 1.8 ratio (ref 0.0–4.4)
Cholesterol, Total: 170 mg/dL (ref 100–199)
HDL: 95 mg/dL (ref >39)
LDL CALC: 64 mg/dL (ref 0–99)
Triglycerides: 56 mg/dL (ref 0–149)
VLDL CHOLESTEROL CAL: 11 mg/dL (ref 5–40)

## 2017-08-11 NOTE — Telephone Encounter (Signed)
-----   Message from Massie MaroonLachina M Hollis, [place] sent at 08/11/2017  2:40 PM EST ----- Please inform patient that cholesterol panel is within normal range.  No medication changes are warranted at this time.  Please fax cholesterol results to Dr. Sharyn LullHarwani, cardiologist. Thanks

## 2017-08-11 NOTE — Telephone Encounter (Signed)
Called and left a message for patient that cholesterol panel is within normal range and no medication changes are needed at this time. I have faxed the results to dr. Annitta JerseyHarwani's office (fax number#[phone]). Thanks!

## 2017-08-12 ENCOUNTER — Other Ambulatory Visit: Payer: Self-pay | Admitting: Family Medicine

## 2017-09-10 ENCOUNTER — Other Ambulatory Visit: Payer: Self-pay | Admitting: Family Medicine

## 2017-10-10 ENCOUNTER — Other Ambulatory Visit: Payer: Self-pay | Admitting: Family Medicine

## 2017-11-08 ENCOUNTER — Encounter: Payer: Self-pay | Admitting: Family Medicine

## 2017-11-08 ENCOUNTER — Ambulatory Visit (INDEPENDENT_AMBULATORY_CARE_PROVIDER_SITE_OTHER): Payer: Medicare Other | Admitting: Family Medicine

## 2017-11-08 VITALS — BP 126/61 | HR 86 | Temp 98.3°F | Resp 16 | Ht <= 58 in | Wt 118.0 lb

## 2017-11-08 DIAGNOSIS — R252 Cramp and spasm: Secondary | ICD-10-CM | POA: Diagnosis not present

## 2017-11-08 DIAGNOSIS — H9193 Unspecified hearing loss, bilateral: Secondary | ICD-10-CM

## 2017-11-08 DIAGNOSIS — I10 Essential (primary) hypertension: Secondary | ICD-10-CM | POA: Diagnosis not present

## 2017-11-08 LAB — POCT URINALYSIS DIPSTICK
BILIRUBIN UA: NEGATIVE
Blood, UA: NEGATIVE
Glucose, UA: NEGATIVE
KETONES UA: NEGATIVE
Leukocytes, UA: NEGATIVE
NITRITE UA: NEGATIVE
PROTEIN UA: NEGATIVE
Spec Grav, UA: 1.015 (ref 1.010–1.025)
Urobilinogen, UA: 0.2 E.U./dL
pH, UA: 8.5 — AB (ref 5.0–8.0)

## 2017-11-08 NOTE — Progress Notes (Signed)
Subjective:    Patient ID: Sarah Valenzuela, female    DOB: [birth year], 84 y.o.   MRN: [id number]  Sarah Valenzuela, pleasant 84 year old female presents for a 6 month follow up of chronic conditions. She maintains that she is doing well and has minimal complaints. She continues to exercise at the [facility] 3 times per week and stays active in her church.  She has a history of hypertension, hyperlipidemia, and asthma.  She says that she has not had an asthma exacerbation in several years. She continues to use Advair daily.    Hypertension has been controlled on current medication regimen. She exercises routinely and follows a low salt diet. She denies heart palpitations, syncope, headaches, dizziness, or peripheral edema.      Past Medical History:  Diagnosis Date  . Asthma   . Hypercholesteremia   . Hyperlipidemia   . Hypertension   . Osteoporosis 02/2017   T score -3.1  . Situational stress    Social History   Socioeconomic History  . Marital status: Widowed    Spouse name: Not on file  . Number of children: Not on file  . Years of education: Not on file  . Highest education level: Not on file  Occupational History  . Not on file  Social Needs  . Financial resource strain: Not on file  . Food insecurity:    Worry: Not on file    Inability: Not on file  . Transportation needs:    Medical: Not on file    Non-medical: Not on file  Tobacco Use  . Smoking status: Never Smoker  . Smokeless tobacco: Never Used  Substance and Sexual Activity  . Alcohol use: No  . Drug use: No  . Sexual activity: Never  Lifestyle  . Physical activity:    Days per week: Not on file    Minutes per session: Not on file  . Stress: Not on file  Relationships  . Social connections:    Talks on phone: Not on file    Gets together: Not on file    Attends religious service: Not on file    Active member of club or organization: Not on file    Attends meetings of clubs or organizations: Not on file     Relationship status: Not on file  . Intimate partner violence:    Fear of current or ex partner: Not on file    Emotionally abused: Not on file    Physically abused: Not on file    Forced sexual activity: Not on file  Other Topics Concern  . Not on file  Social History Narrative  . Not on file   Allergies  Allergen Reactions  . Penicillins Other (See Comments)    Breaks out. Longtime allergy   Review of Systems  Constitutional: Negative for malaise/fatigue.  Eyes: Negative for blurred vision.       Dry eyes controlled by restasis and Systane  Respiratory: Negative for apnea, chest tightness and shortness of breath.   Cardiovascular: Negative.  Negative for chest pain, palpitations, orthopnea and leg swelling.  Endocrine: Negative.   Genitourinary: Negative for difficulty urinating, vaginal discharge and vaginal pain.  Musculoskeletal: Negative.  Negative for neck pain.       Leg cramps  Skin: Negative.   Neurological: Negative for dizziness and tremors.  Hematological: Negative.        Objective:   Physical Exam  Constitutional: She is oriented to person, place, and time. She  appears well-developed and well-nourished.  HENT:  Head: Normocephalic and atraumatic.  Right Ear: External ear normal.  Left Ear: External ear normal.  Mouth/Throat: Oropharynx is clear and moist.  Eyes: Pupils are equal, round, and reactive to light. Conjunctivae and EOM are normal.  Neck: Normal range of motion. Neck supple.  Cardiovascular: Normal rate, regular rhythm, normal heart sounds and intact distal pulses.  Abdominal: Soft. Bowel sounds are normal.  Musculoskeletal: Normal range of motion.  Neurological: She is alert and oriented to person, place, and time. She has normal reflexes.  Skin: Skin is warm and dry.  Psychiatric: She has a normal mood and affect. Her behavior is normal. Judgment and thought content normal.   BP 126/61 (BP Location: Left Arm, Patient Position: Sitting,  Cuff Size: Normal)   Pulse 86   Temp 98.3 F (36.8 C) (Oral)   Resp 16   Ht  (1.448 m)   Wt 118 lb (53.5 kg)   SpO2 100%   BMI 25.53 kg/m     Assessment & Plan:  1. Essential hypertension Blood pressure is at goal on current medication reg - Urinalysis Dipstick - Basic Metabolic Panel  2. Leg cramps Advised to eat a potassium rich diet.  Increase water intake to 3-4 bottles per day - Basic Metabolic Panel  3. Decreased hearing of both ears Hearing exam abnormal,will defer to audiology for further workup and evaluation - Ambulatory referral to Audiology  RTC: 6 months for CPE   Nolon Nations  MSN, FNP-C Patient Care [facility] [address] [place], [place] [address] [phone]

## 2017-11-08 NOTE — Patient Instructions (Signed)
Will follow up by phone with any abnormal laboratory results Continue diet high in natural potassium (green-leafy veggies, bananas,apricots, etc) Ensure that you are drinking 2-3 bottles of water per day.   Leg Cramps Leg cramps occur when a muscle or muscles tighten and you have no control over this tightening (involuntary muscle contraction). Muscle cramps can develop in any muscle, but the most common place is in the calf muscles of the leg. Those cramps can occur during exercise or when you are at rest. Leg cramps are painful, and they may last for a few seconds to a few minutes. Cramps may return several times before they finally stop. Usually, leg cramps are not caused by a serious medical problem. In many cases, the cause is not known. Some common causes include:  Overexertion.  Overuse from repetitive motions, or doing the same thing over and over.  Remaining in a certain position for a long period of time.  Improper preparation, form, or technique while performing a sport or an activity.  Dehydration.  Injury.  Side effects of some medicines.  Abnormally low levels of the salts and ions in your blood (electrolytes), especially potassium and calcium. These levels could be low if you are taking water pills (diuretics) or if you are pregnant.  Follow these instructions at home: Watch your condition for any changes. Taking the following actions may help to lessen any discomfort that you are feeling:  Stay well-hydrated. Drink enough fluid to keep your urine clear or pale yellow.  Try massaging, stretching, and relaxing the affected muscle. Do this for several minutes at a time.  For tight or tense muscles, use a warm towel, heating pad, or hot shower water directed to the affected area.  If you are sore or have pain after a cramp, applying ice to the affected area may relieve discomfort. ? Put ice in a plastic bag. ? Place a towel between your skin and the bag. ? Leave the  ice on for 20 minutes, 2-3 times per day.  Avoid strenuous exercise for several days if you have been having frequent leg cramps.  Make sure that your diet includes the essential minerals for your muscles to work normally.  Take medicines only as directed by your health care provider.  Contact a health care provider if:  Your leg cramps get more severe or more frequent, or they do not improve over time.  Your foot becomes cold, numb, or blue. This information is not intended to replace advice given to you by your health care provider. Make sure you discuss any questions you have with your health care provider. Document Released: 07/21/2004 Document Revised: 11/19/2015 Document Reviewed: 05/21/2014 Elsevier Interactive Patient Education  [employer].

## 2017-11-09 ENCOUNTER — Telehealth: Payer: Self-pay

## 2017-11-09 ENCOUNTER — Other Ambulatory Visit: Payer: Self-pay | Admitting: Family Medicine

## 2017-11-09 LAB — BASIC METABOLIC PANEL
BUN / CREAT RATIO: 10 — AB (ref 12–28)
BUN: 8 mg/dL (ref 8–27)
CALCIUM: 9.6 mg/dL (ref 8.7–10.3)
CHLORIDE: 100 mmol/L (ref 96–106)
CO2: 25 mmol/L (ref 20–29)
CREATININE: 0.78 mg/dL (ref 0.57–1.00)
GFR calc non Af Amer: 71 mL/min/{1.73_m2} (ref >59)
GFR, EST AFRICAN AMERICAN: 81 mL/min/{1.73_m2} (ref >59)
Glucose: 80 mg/dL (ref 65–99)
Potassium: 3.7 mmol/L (ref 3.5–5.2)
Sodium: 139 mmol/L (ref 134–144)

## 2017-11-09 NOTE — Telephone Encounter (Signed)
-----   Message from Massie Maroon, [place] sent at 11/09/2017  8:56 AM EDT ----- Regarding: lab results Please inform patient that electrolytes are within a normal range. Often times in elderly patient lower extremity cramping is indicative of dehydration. Advise patient to hydrate 20-30 ounces of water daily. If cramping persists or worsens, please scheduled follow up for further workup and evaluation.   Nolon Nations  MSN, FNP-C Patient Care [facility] [address] [place], [place] [address] [phone]

## 2017-11-09 NOTE — Telephone Encounter (Signed)
Called, no answer. Left a message advising that all labs were within a normal range. Advised that patient should hydrate 20-30 ounces of water daily to help with hydration that could be the cause of cramping. Advised that if cramping persists or worsens to call and schedule a follow up. Thanks!

## 2017-11-22 ENCOUNTER — Other Ambulatory Visit: Payer: Self-pay | Admitting: Family Medicine

## 2017-11-22 DIAGNOSIS — H9193 Unspecified hearing loss, bilateral: Secondary | ICD-10-CM

## 2017-11-22 NOTE — Progress Notes (Signed)
Orders Placed This Encounter  Procedures  . Ambulatory referral to Audiology    Referral Priority:   Routine    Referral Type:   Audiology Exam    Referral Reason:   Specialty Services Required    Number of Visits Requested:   1     Vickie Ponds Rennis Petty  MSN, FNP-C Patient [facility] [address] [place], [place] [address] [phone]

## 2017-12-12 ENCOUNTER — Other Ambulatory Visit: Payer: Self-pay | Admitting: Family Medicine

## 2018-01-09 ENCOUNTER — Other Ambulatory Visit: Payer: Self-pay | Admitting: Anesthesiology

## 2018-01-09 DIAGNOSIS — Z1231 Encounter for screening mammogram for malignant neoplasm of breast: Secondary | ICD-10-CM

## 2018-01-10 ENCOUNTER — Other Ambulatory Visit: Payer: Self-pay | Admitting: Family Medicine

## 2018-01-12 ENCOUNTER — Other Ambulatory Visit: Payer: Self-pay | Admitting: Family Medicine

## 2018-02-07 ENCOUNTER — Ambulatory Visit
Admission: RE | Admit: 2018-02-07 | Discharge: 2018-02-07 | Disposition: A | Discharge status: Home / Self Care | Payer: Medicare Other | Source: Ambulatory Visit | Attending: Anesthesiology | Admitting: Anesthesiology

## 2018-02-07 DIAGNOSIS — Z1231 Encounter for screening mammogram for malignant neoplasm of breast: Secondary | ICD-10-CM

## 2018-02-10 ENCOUNTER — Other Ambulatory Visit: Payer: Self-pay | Admitting: Internal Medicine

## 2018-02-14 NOTE — Telephone Encounter (Signed)
Will need to be seen for further refills.

## 2018-03-15 ENCOUNTER — Other Ambulatory Visit: Payer: Self-pay | Admitting: Family Medicine

## 2018-04-02 ENCOUNTER — Ambulatory Visit (INDEPENDENT_AMBULATORY_CARE_PROVIDER_SITE_OTHER): Payer: Medicare Other

## 2018-04-02 DIAGNOSIS — Z23 Encounter for immunization: Secondary | ICD-10-CM | POA: Diagnosis not present

## 2018-05-11 ENCOUNTER — Encounter: Payer: Self-pay | Admitting: Family Medicine

## 2018-05-11 ENCOUNTER — Ambulatory Visit (INDEPENDENT_AMBULATORY_CARE_PROVIDER_SITE_OTHER): Payer: Medicare Other | Admitting: Family Medicine

## 2018-05-11 ENCOUNTER — Other Ambulatory Visit: Payer: Self-pay | Admitting: Obstetrics & Gynecology

## 2018-05-11 VITALS — BP 135/59 | HR 77 | Temp 97.9°F | Resp 16 | Ht <= 58 in | Wt 121.0 lb

## 2018-05-11 DIAGNOSIS — H9193 Unspecified hearing loss, bilateral: Secondary | ICD-10-CM | POA: Diagnosis not present

## 2018-05-11 DIAGNOSIS — Z0000 Encounter for general adult medical examination without abnormal findings: Secondary | ICD-10-CM

## 2018-05-11 DIAGNOSIS — Z741 Need for assistance with personal care: Secondary | ICD-10-CM | POA: Diagnosis not present

## 2018-05-11 MED ORDER — ZAFIRLUKAST 20 MG PO TABS: 20.0000 mg | ORAL_TABLET | Freq: Two times a day (BID) | ORAL | 3 refills | Status: DC

## 2018-05-11 MED ORDER — TRIAMTERENE-HCTZ 37.5-25 MG PO TABS: 0.5000 | ORAL_TABLET | Freq: Every day | ORAL | 3 refills | Status: DC

## 2018-05-11 MED ORDER — FLUTICASONE PROPIONATE 50 MCG/ACT NA SUSP: 2.0000 | Freq: Every day | NASAL | 5 refills | Status: DC

## 2018-05-11 MED ORDER — FLUTICASONE-SALMETEROL 100-50 MCG/DOSE IN AEPB: 1.0000 | INHALATION_SPRAY | Freq: Two times a day (BID) | RESPIRATORY_TRACT | 3 refills | Status: DC

## 2018-05-11 NOTE — Progress Notes (Signed)
Subjective:    Sarah Valenzuela is a 84 y.o. female who presents for Medicare Annual/Subsequent preventive examination.  Preventive Screening-Counseling & Management  Tobacco Social History   Tobacco Use  Smoking Status Never Smoker  Smokeless Tobacco Never Used      Current Problems (verified) Patient Active Problem List   Diagnosis Date Noted  . Asthma 05/04/2015  . Medicare annual wellness visit, subsequent 08/20/2014  . Vaginal dryness 06/02/2014  . Situational depression 05/30/2014  . Anxiety state 05/30/2014  . Vaginal itching 11/14/2012  . Occasional tremors 11/14/2012  . HTN (hypertension) 11/14/2012  . Hyperlipidemia 11/14/2012  . Adjustment disorder with mixed anxiety and depressed mood 11/14/2012  . Seasonal allergies 11/14/2012    Medications Prior to Visit Current Outpatient Medications on File Prior to Visit  Medication Sig Dispense Refill  . acetaminophen (TYLENOL) 500 MG tablet Take 1 tablet (500 mg total) by mouth every 6 (six) hours as needed for pain. 30 tablet 0  . aspirin 81 MG chewable tablet Chew 81 mg by mouth daily.    . ATROVENT HFA 17 MCG/ACT inhaler USE 2 PUFFS 3 TIMES DAILY AS NEEDED FOR SHORTNESS OF BREATH. 25.8 g 0  . calcium citrate (CALCITRATE - DOSED IN MG ELEMENTAL CALCIUM) 950 MG tablet Take 1 tablet by mouth 2 (two) times daily.    . cetirizine (ZYRTEC) 10 MG tablet Take 10 mg by mouth daily.    . cycloSPORINE (RESTASIS) 0.05 % ophthalmic emulsion Place 1 drop into both eyes 2 (two) times daily.    . fluticasone (FLONASE) 50 MCG/ACT nasal spray USE 2 SPRAYS IN EACH NOSTRIL ONCE DAILY 16 g 0  . fluticasone (FLONASE) 50 MCG/ACT nasal spray USE 2 SPRAYS IN EACH NOSTRIL ONCE DAILY 16 g 2  . Fluticasone-Salmeterol (ADVAIR) 100-50 MCG/DOSE AEPB USE 1 INHALATION TWICE DAILY. RINSE MOUTH AFTER USE. 60 each 2  . rosuvastatin (CRESTOR) 5 MG tablet Take 5 mg by mouth daily.    . SENNOSIDES PO Take 8.5 mg by mouth as needed (Swiss Kriss OTC  Laxative).    . triamterene-hydrochlorothiazide (MAXZIDE-25) 37.5-25 MG per tablet Take 0.5 tablets by mouth daily. 30 tablet 2  . Vaginal Moisturizer (VAGISIL FEMININE MOISTURIZER) LOTN Place vaginally 3 (three) times daily.    . zafirlukast (ACCOLATE) 20 MG tablet TAKE 1 TABLET BY MOUTH TWICE DAILY. 60 tablet 2   No current facility-administered medications on file prior to visit.     Current Medications (verified) Current Outpatient Medications  Medication Sig Dispense Refill  . acetaminophen (TYLENOL) 500 MG tablet Take 1 tablet (500 mg total) by mouth every 6 (six) hours as needed for pain. 30 tablet 0  . aspirin 81 MG chewable tablet Chew 81 mg by mouth daily.    . ATROVENT HFA 17 MCG/ACT inhaler USE 2 PUFFS 3 TIMES DAILY AS NEEDED FOR SHORTNESS OF BREATH. 25.8 g 0  . calcium citrate (CALCITRATE - DOSED IN MG ELEMENTAL CALCIUM) 950 MG tablet Take 1 tablet by mouth 2 (two) times daily.    . cetirizine (ZYRTEC) 10 MG tablet Take 10 mg by mouth daily.    . cycloSPORINE (RESTASIS) 0.05 % ophthalmic emulsion Place 1 drop into both eyes 2 (two) times daily.    . fluticasone (FLONASE) 50 MCG/ACT nasal spray USE 2 SPRAYS IN EACH NOSTRIL ONCE DAILY 16 g 0  . fluticasone (FLONASE) 50 MCG/ACT nasal spray USE 2 SPRAYS IN EACH NOSTRIL ONCE DAILY 16 g 2  . Fluticasone-Salmeterol (ADVAIR) 100-50 MCG/DOSE AEPB USE 1  INHALATION TWICE DAILY. RINSE MOUTH AFTER USE. 60 each 2  . rosuvastatin (CRESTOR) 5 MG tablet Take 5 mg by mouth daily.    . SENNOSIDES PO Take 8.5 mg by mouth as needed (Swiss Kriss OTC Laxative).    . triamterene-hydrochlorothiazide (MAXZIDE-25) 37.5-25 MG per tablet Take 0.5 tablets by mouth daily. 30 tablet 2  . Vaginal Moisturizer (VAGISIL FEMININE MOISTURIZER) LOTN Place vaginally 3 (three) times daily.    . zafirlukast (ACCOLATE) 20 MG tablet TAKE 1 TABLET BY MOUTH TWICE DAILY. 60 tablet 2   No current facility-administered medications for this visit.      Allergies  (verified) Penicillins   PAST HISTORY  Family History Family History  Problem Relation Age of Onset  . Cancer Brother        Prostate  . Breast cancer Sister        UNSURE  . Diabetes Sister   . Heart disease Sister     Social History Social History   Tobacco Use  . Smoking status: Never Smoker  . Smokeless tobacco: Never Used  Substance Use Topics  . Alcohol use: No     Are there smokers in your home (other than you)? No  Risk Factors Current exercise habits: Gym 3 times a week. Walks 1 mile and does aerobics  Dietary issues discussed: Denies problems with chewing or eating. Patient states that she limits red meat. Stopped eating pork and seafood. Eats a heart healthy diet.    Cardiac risk factors: advanced age (older than 5 for men, 19 for women).  Depression Screen (Note: if answer to either of the following is "Yes", a more complete depression screening is indicated)   Over the past two weeks, have you felt down, depressed or hopeless? No  Over the past two weeks, have you felt little interest or pleasure in doing things? No  Have you lost interest or pleasure in daily life? No  Do you often feel hopeless? No  Do you cry easily over simple problems? No  Activities of Daily Living In your present state of health, do you have any difficulty performing the following activities?:  Driving? No Managing money?  No Feeding yourself? No Getting from bed to chair? No Climbing a flight of stairs? No Preparing food and eating?: No Bathing or showering? She has a fear of falling and would like a shower chair.  Getting dressed: No Getting to the toilet? No Using the toilet:No Moving around from place to place: No In the past year have you fallen or had a near fall?:No  Are you sexually active?  No  Do you have more than one partner?  No  Hearing Difficulties: She has a referral placed for audiology.  Do you often ask people to speak up or repeat themselves? No Do  you experience ringing or noises in your ears? No Do you have difficulty understanding soft or whispered voices? Yes   Do you feel that you have a problem with memory? No  Do you often misplace items? No  Do you feel safe at home?  Yes  Cognitive Testing  Alert? Yes  Normal Appearance?Yes  Oriented to person? Yes  Place? Yes   Time? Yes  Recall of three objects?  Yes  Can perform simple calculations? Yes  Displays appropriate judgment?Yes  Can read the correct time from a watch face?Yes   Advanced Directives have been discussed with the patient? Yes  List the Names of Other Physician/Practitioners you currently use: 1.  [facility]   Indicate any recent Medical Services you may have received from other than [facility] providers in the past year (date may be approximate). None reported Immunization History  Administered Date(s) Administered  . Influenza Whole 04/17/2012  . Influenza,inj,Quad PF,6+ Mos 04/01/2013, 03/24/2014, 04/22/2016, 04/02/2018  . Influenza-Unspecified 04/19/2015, 04/07/2017  . Pneumococcal Conjugate-13 08/20/2014  . Tdap 08/21/2013    Screening Tests Health Maintenance  Topic Date Due  . TETANUS/TDAP  08/22/2023  . DEXA SCAN  Completed    All answers were reviewed with the patient and necessary referrals were made:  Mike Gip, FNP   05/11/2018   History reviewed: allergies, current medications, past family history, past medical history, past social history, past surgical history and problem list  Review of Systems Pertinent items noted in HPI and remainder of comprehensive ROS otherwise negative.    Objective:     Vision by Snellen chart: right eye:20/20, left eye:20/20  BP (!) 135/59 (BP Location: Left Arm, Patient Position: Sitting, Cuff Size: Normal)   Pulse 77   Temp 97.9 F (36.6 C) (Oral)   Resp 16   Ht 4\' 9"  (1.448 m)   Wt 121 lb (54.9 kg)   SpO2 100%   BMI 26.18 kg/m   General Appearance:    Alert, cooperative,  no distress, appears stated age  Head:    Normocephalic, without obvious abnormality, atraumatic  Eyes:    PERRL, conjunctiva/corneas clear, EOM's intact, fundi    benign, both eyes  Ears:    Normal TM's and external ear canals, both ears  Throat:   Lips, mucosa, and tongue normal; teeth and gums normal  Neck:   Supple, symmetrical, trachea midline, no adenopathy;    thyroid:  no enlargement/tenderness/nodules; no carotid   bruit or JVD  Back:     Symmetric, no curvature, ROM normal, no CVA tenderness  Lungs:     Clear to auscultation bilaterally, respirations unlabored  Chest Wall:    No tenderness or deformity   Heart:    Regular rate and rhythm, S1 and S2 normal, no murmur, rub   or gallop  Abdomen:     Soft, non-tender, bowel sounds active all four quadrants,    no masses, no organomegaly  Genitalia:  deferred  Rectal:  deferred  Extremities:   Extremities normal, atraumatic, no cyanosis or edema  Pulses:   2+ and symmetric all extremities  Skin:   Skin color, texture, turgor normal, no rashes or lesions  Lymph nodes:   Cervical, supraclavicular, and axillary nodes normal  Neurologic:   CNII-XII intact, normal strength, sensation and reflexes    throughout       Assessment:     1. Assistance needed for bathing Faxed to advanced home care - Shower chair  2. Medicare annual wellness visit, subsequent  3. Decreased hearing of both ears - Ambulatory referral to Audiology      Plan:     During the course of the visit the patient was educated and counseled about appropriate screening and preventive services including:    Pneumococcal vaccine   Influenza vaccine  Advanced directives: power of attorney for healthcare on file, has an advanced directive - a copy has been provided  Diet review for nutrition referral? Yes ____  Not Indicated __x__   Patient Instructions (the written plan) was given to the patient.  Medicare Attestation I have personally reviewed: The  patient's medical and social history Their use of alcohol, tobacco or illicit drugs Their  current medications and supplements The patient's functional ability including ADLs,fall risks, home safety risks, cognitive, and hearing and visual impairment Diet and physical activities Evidence for depression or mood disorders  The patient's weight, height, BMI, and visual acuity have been recorded in the chart.  I have made referrals, counseling, and provided education to the patient based on review of the above and I have provided the patient with a written personalized care plan for preventive services.     Mike GipAndre Gareth Fitzner, FNP   05/11/2018

## 2018-05-11 NOTE — Patient Instructions (Addendum)
  Ms. Myrene GalasMcBryde , Thank you for taking time to come for your Medicare Wellness Visit. I appreciate your ongoing commitment to your health goals. Please review the following plan we discussed and let me know if I can assist you in the future.   These are the goals we discussed: Goals    . Prevent falls       This is a list of the screening recommended for you and due dates:  Health Maintenance  Topic Date Due  . Tetanus Vaccine  08/22/2023  . DEXA scan (bone density measurement)  Completed    Preventing Falls and Fractures  Falls can be very serious, especially for older adults or people with osteoporosis  Falls can be caused by:  Tripping or slipping  Slow reflexes  Balance problems  Reduced muscle strength  Poor vision or a recent change in prescription  Illness and some medications (especially blood pressure pills, diuretics, heart medicines, muscle relaxants and sleep medications)  Drinking alcohol  To prevent falls outdoors:  Use a can or walker if needed  Wear rubber-soled shoes so you don't slip  DO NOT buy "shape up" shoes with rocker bottom soles if you have balance problems.  The thick soles and shape make it more difficult to keep your balance.  Put kitty litter or salt on icy sidewalks  Walk on the grass if the sidewalks are slick  Avoid walking on uneven ground whenever possible  T prevent falls indoors:  Keep rooms clutter-free, especially hallways, stairs and paths to light switches  Remove throw rugs  Install night lights, especially to and in the bathroom  Turn on lights before going downstairs  Keep a flashlight next to your bed  Buy a cordless phone to keep with you instead of jumping up to answer the phone  Install grab bars in the bathroom near the shower and toilet  Install rails on both sides of the stairs.  Make sure the stairs are well lit  Wear slippers with non-skid soles.  Do not walk around in stockings or socks  Balance  problems and dizziness are not a normal part of growing older.  If you begin having balance problems or dizziness see your doctor.  Physical Therapy can help you with many balance problems, strengthening hip and leg muscles and with gait training.  To keep your bones healthy make sure you are getting enough calcium and Vitamin D each day.  Ask your doctor or pharmacist about supplements.  Regular weight-bearing exercise like walking, lifting weights or dancing can help strengthen bones and prevent osteoporosis.

## 2018-05-11 NOTE — Telephone Encounter (Signed)
Annual scheduled on 05/14/18

## 2018-05-14 ENCOUNTER — Ambulatory Visit: Payer: Medicare Other | Admitting: Obstetrics & Gynecology

## 2018-05-14 ENCOUNTER — Encounter: Payer: Self-pay | Admitting: Obstetrics & Gynecology

## 2018-05-14 VITALS — BP 130/70 | Ht <= 58 in | Wt 121.0 lb

## 2018-05-14 DIAGNOSIS — M810 Age-related osteoporosis without current pathological fracture: Secondary | ICD-10-CM

## 2018-05-14 DIAGNOSIS — Z780 Asymptomatic menopausal state: Secondary | ICD-10-CM

## 2018-05-14 DIAGNOSIS — Z01419 Encounter for gynecological examination (general) (routine) without abnormal findings: Secondary | ICD-10-CM

## 2018-05-14 NOTE — Progress Notes (Signed)
Sarah GraterLovie L Gass Dec 23, 1931 [id number]   History:    84 y.o. G1P1L1 Widowed.  D[place] lives in [place].  RP:  Established patient presenting for annual gyn exam   HPI: Very active at church and stays physically fit, exercising at the [facility] [employer] regularly.  Menopause, well on no hormone replacement therapy.  No postmenopausal bleeding.  No pelvic pain.  Abstinent.  Urine and bowel movements normal.  Breasts normal.  Body mass index 25.96.  Health labs with family physician.  Past medical history,surgical history, family history and social history were all reviewed and documented in the EPIC chart.  Gynecologic History No LMP recorded. Patient is postmenopausal. Contraception: abstinence and post menopausal status Last Pap: 01/2017. Results were: Negative Last mammogram: 01/2018. Results were: Negative Bone Density: 02/2017 Osteoporosis T-Score -3.1 at the Spine Colonoscopy: No longer doing Screening Colonoscopy  Obstetric History OB History  Gravida Para Term Preterm AB Living  1 1       1   SAB TAB Ectopic Multiple Live Births               # Outcome Date GA Lbr Len/2nd Weight Sex Delivery Anes PTL Lv  1 Para              ROS: A ROS was performed and pertinent positives and negatives are included in the history.  GENERAL: No fevers or chills. HEENT: No change in vision, no earache, sore throat or sinus congestion. NECK: No pain or stiffness. CARDIOVASCULAR: No chest pain or pressure. No palpitations. PULMONARY: No shortness of breath, cough or wheeze. GASTROINTESTINAL: No abdominal pain, nausea, vomiting or diarrhea, melena or bright red blood per rectum. GENITOURINARY: No urinary frequency, urgency, hesitancy or dysuria. MUSCULOSKELETAL: No joint or muscle pain, no back pain, no recent trauma. DERMATOLOGIC: No rash, no itching, no lesions. ENDOCRINE: No polyuria, polydipsia, no heat or cold intolerance. No recent change in weight. HEMATOLOGICAL: No anemia or easy bruising or bleeding.  NEUROLOGIC: No headache, seizures, numbness, tingling or weakness. PSYCHIATRIC: No depression, no loss of interest in normal activity or change in sleep pattern.     Exam:   BP 130/70   Ht 4' 9.25" (1.454 m)   Wt 121 lb (54.9 kg)   BMI 25.96 kg/m   Body mass index is 25.96 kg/m.  General appearance : Well developed well nourished female. No acute distress HEENT: Eyes: no retinal hemorrhage or exudates,  Neck supple, trachea midline, no carotid bruits, no thyroidmegaly Lungs: Clear to auscultation, no rhonchi or wheezes, or rib retractions  Heart: Regular rate and rhythm, no murmurs or gallops Breast:Examined in sitting and supine position were symmetrical in appearance, no palpable masses or tenderness,  no skin retraction, no nipple inversion, no nipple discharge, no skin discoloration, no axillary or supraclavicular lymphadenopathy Abdomen: no palpable masses or tenderness, no rebound or guarding Extremities: no edema or skin discoloration or tenderness  Pelvic: Vulva: Normal             Vagina: No gross lesions or discharge  Cervix: No gross lesions or discharge  Uterus  AV, normal size, shape and consistency, non-tender and mobile  Adnexa  Without masses or tenderness  Anus: Normal   Assessment/Plan:  84 y.o. female for annual exam   1. Well female exam with routine gynecological exam Normal gynecologic exam.  Pap test negative in August 2018.  No indication to repeat Pap tests.  Breast exam normal.  Screening mammogram was negative in August 2019.  No longer  doing colonoscopies.  Health labs with family physician.  Good body mass index at 25.96.  Continue with good nutrition and fitness.  2. Postmenopausal Well on no hormone replacement therapy.  No postmenopausal bleeding.  3. Age-related osteoporosis without current pathological fracture Osteoporosis with a T score of -3.1 at the spine on bone density September 2018.  Patient is on vitamin D supplements and takes  calcium 1.5 g/day.  Very physically active, going to the gym regularly.  Prefers not to take meds for osteoporosis at this time.  Will repeat bone density in 02/2019.  Genia Del MD, 11:58 AM 05/14/2018

## 2018-05-20 ENCOUNTER — Encounter: Payer: Self-pay | Admitting: Obstetrics & Gynecology

## 2018-05-20 NOTE — Patient Instructions (Signed)
1. Well female exam with routine gynecological exam Normal gynecologic exam.  Pap test negative in August 2018.  No indication to repeat Pap tests.  Breast exam normal.  Screening mammogram was negative in August 2019.  No longer doing colonoscopies.  Health labs with family physician.  Good body mass index at 25.96.  Continue with good nutrition and fitness.  2. Postmenopausal Well on no hormone replacement therapy.  No postmenopausal bleeding.  3. Age-related osteoporosis without current pathological fracture Osteoporosis with a T score of -3.1 at the spine on bone density September 2018.  Patient is on vitamin D supplements and takes calcium 1.5 g/day.  Very physically active, going to the gym regularly.  Prefers not to take meds for osteoporosis at this time.  Will repeat bone density in 02/2019.  Sarah Valenzuela, it was a pleasure seeing you today!

## 2018-06-15 ENCOUNTER — Telehealth: Payer: Self-pay

## 2018-06-15 MED ORDER — IPRATROPIUM BROMIDE HFA 17 MCG/ACT IN AERS: INHALATION_SPRAY | RESPIRATORY_TRACT | 0 refills | Status: DC

## 2018-06-15 NOTE — Telephone Encounter (Signed)
Refill for atrovent sent into pharmacy. Thanks!

## 2018-09-10 ENCOUNTER — Other Ambulatory Visit: Payer: Self-pay | Admitting: Obstetrics & Gynecology

## 2018-10-26 ENCOUNTER — Ambulatory Visit: Payer: Medicare Other | Admitting: Family Medicine

## 2018-11-07 ENCOUNTER — Ambulatory Visit (INDEPENDENT_AMBULATORY_CARE_PROVIDER_SITE_OTHER): Payer: Medicare Other | Admitting: Family Medicine

## 2018-11-07 ENCOUNTER — Other Ambulatory Visit: Payer: Self-pay

## 2018-11-07 DIAGNOSIS — J4520 Mild intermittent asthma, uncomplicated: Secondary | ICD-10-CM | POA: Diagnosis not present

## 2018-11-07 DIAGNOSIS — I10 Essential (primary) hypertension: Secondary | ICD-10-CM | POA: Diagnosis not present

## 2018-11-07 DIAGNOSIS — J302 Other seasonal allergic rhinitis: Secondary | ICD-10-CM | POA: Diagnosis not present

## 2018-11-07 MED ORDER — FLUTICASONE-SALMETEROL 100-50 MCG/DOSE IN AEPB: 1.0000 | INHALATION_SPRAY | Freq: Two times a day (BID) | RESPIRATORY_TRACT | 3 refills | Status: DC

## 2018-11-07 MED ORDER — ZAFIRLUKAST 20 MG PO TABS: 20.0000 mg | ORAL_TABLET | Freq: Two times a day (BID) | ORAL | 3 refills | Status: DC

## 2018-11-07 MED ORDER — IPRATROPIUM BROMIDE HFA 17 MCG/ACT IN AERS: INHALATION_SPRAY | RESPIRATORY_TRACT | 5 refills | Status: DC

## 2018-11-07 MED ORDER — FLUTICASONE PROPIONATE 50 MCG/ACT NA SUSP: 2.0000 | Freq: Every day | NASAL | 5 refills | Status: DC

## 2018-11-07 MED ORDER — TRIAMTERENE-HCTZ 37.5-25 MG PO TABS: 0.5000 | ORAL_TABLET | Freq: Every day | ORAL | 3 refills | Status: DC

## 2018-11-07 NOTE — Progress Notes (Signed)
  [facility]  Virtual Visit via Telephone Note  I connected with Sarah Valenzuela on 11/07/18 at 11:00 AM EDT by telephone and verified that I am speaking with the correct person using two identifiers.   I discussed the limitations, risks, security and privacy concerns of performing an evaluation and management service by telephone and the availability of in person appointments. I also discussed with the patient that there may be a patient responsible charge related to this service. The patient expressed understanding and agreed to proceed.   History of Present Illness: Sarah Valenzuela  has a past medical history of Asthma, Hypercholesteremia, Hyperlipidemia, Hypertension, Osteoporosis (02/2017), and Situational stress. Patient reports compliance with medications and denies side effects at the present time. She reports staying active in the home and is doing well without complaints. She does monitor her BP readings at home and say they are in the normal range. She denies CP, SOB, Dizziness or leg swelling.    Observations/Objective: Patient with regular voice tone, rate and rhythm. Speaking calmly and is in no apparent distress.    Assessment and Plan: 1. Essential hypertension - triamterene-hydrochlorothiazide (MAXZIDE-25) 37.5-25 MG tablet; Take 0.5 tablets by mouth daily.  Dispense: 45 tablet; Refill: 3  2. Seasonal allergies - fluticasone (FLONASE) 50 MCG/ACT nasal spray; Place 2 sprays into both nostrils daily.  Dispense: 16 g; Refill: 5  3. Mild intermittent asthma without complication - Fluticasone-Salmeterol (ADVAIR) 100-50 MCG/DOSE AEPB; Inhale 1 puff into the lungs 2 (two) times daily.  Dispense: 180 each; Refill: 3 - zafirlukast (ACCOLATE) 20 MG tablet; Take 1 tablet (20 mg total) by mouth 2 (two) times daily.  Dispense: 180 tablet; Refill: 3 - ipratropium (ATROVENT HFA) 17 MCG/ACT inhaler; USE 2 PUFFS 3 TIMES DAILY AS NEEDED FOR  SHORTNESS OF BREATH.  Dispense: 25.8 g; Refill: 5    Follow Up Instructions:  We discussed hand washing, using hand sanitizer when soap and water are not available, only going out when absolutely necessary, and social distancing. Explained to patient that she is immunocompromised and will need to take precautions during this time.   I discussed the assessment and treatment plan with the patient. The patient was provided an opportunity to ask questions and all were answered. The patient agreed with the plan and demonstrated an understanding of the instructions.   The patient was advised to call back or seek an in-person evaluation if the symptoms worsen or if the condition fails to improve as anticipated.  I provided 10 minutes of non-face-to-face time during this encounter.  Ms. Andr L. Riley Lam, FNP-BC [facility] [address] [place], [place] [address] [phone]

## 2019-01-10 ENCOUNTER — Other Ambulatory Visit: Payer: Self-pay | Admitting: Obstetrics & Gynecology

## 2019-02-05 ENCOUNTER — Other Ambulatory Visit: Payer: Self-pay | Admitting: Family Medicine

## 2019-02-05 DIAGNOSIS — Z1231 Encounter for screening mammogram for malignant neoplasm of breast: Secondary | ICD-10-CM

## 2019-02-07 ENCOUNTER — Encounter: Payer: Self-pay | Admitting: Family Medicine

## 2019-02-07 ENCOUNTER — Ambulatory Visit (INDEPENDENT_AMBULATORY_CARE_PROVIDER_SITE_OTHER): Payer: Medicare Other | Admitting: Family Medicine

## 2019-02-07 ENCOUNTER — Other Ambulatory Visit: Payer: Self-pay

## 2019-02-07 VITALS — BP 141/70 | HR 81 | Temp 98.6°F | Resp 14 | Ht <= 58 in | Wt 119.0 lb

## 2019-02-07 DIAGNOSIS — I10 Essential (primary) hypertension: Secondary | ICD-10-CM

## 2019-02-07 DIAGNOSIS — K5900 Constipation, unspecified: Secondary | ICD-10-CM | POA: Diagnosis not present

## 2019-02-07 LAB — POCT URINALYSIS DIPSTICK
Bilirubin, UA: NEGATIVE
Glucose, UA: NEGATIVE
Ketones, UA: NEGATIVE
Nitrite, UA: NEGATIVE
Protein, UA: NEGATIVE
Spec Grav, UA: 1.015 (ref 1.010–1.025)
Urobilinogen, UA: 0.2 E.U./dL
pH, UA: 7.5 (ref 5.0–8.0)

## 2019-02-07 MED ORDER — DOCUSATE SODIUM 100 MG PO CAPS: 100.0000 mg | ORAL_CAPSULE | Freq: Every day | ORAL | 2 refills | Status: DC

## 2019-02-07 NOTE — Progress Notes (Signed)
[facility]   Progress Note: General Provider: Lanae Boast, FNP  SUBJECTIVE:   Sarah Valenzuela is a 85 y.o. female who  has a past medical history of Asthma, Hypercholesteremia, Hyperlipidemia, Hypertension, Osteoporosis (02/2017), and Situational stress.. Patient presents today for Hypertension and Hyperlipidemia Patient reports compliance with all medications.  Patient denies side effects of medications. She reports daily exercise, but has slowed down since COVID quarantine. She denies problems or concerns today. Generally healthy diet and lifestyle.    Review of Systems  Constitutional: Negative.   HENT: Negative.   Eyes: Negative.   Respiratory: Negative.   Cardiovascular: Negative.   Gastrointestinal: Negative.   Genitourinary: Negative.   Musculoskeletal: Negative.   Skin: Negative.   Neurological: Negative.   Psychiatric/Behavioral: Negative.      OBJECTIVE: BP (!) 141/70 (BP Location: Left Arm, Patient Position: Sitting, Cuff Size: Normal)   Pulse 81   Temp 98.6 F (37 C) (Oral)   Resp 14   Ht 4\' 9"  (1.448 m)   Wt 119 lb (54 kg)   SpO2 100%   BMI 25.75 kg/m   Wt Readings from Last 3 Encounters:  02/07/19 119 lb (54 kg)  05/14/18 121 lb (54.9 kg)  05/11/18 121 lb (54.9 kg)     Physical Exam Vitals signs and nursing note reviewed.  Constitutional:      General: She is not in acute distress.    Appearance: Normal appearance. She is well-developed.  HENT:     Head: Normocephalic and atraumatic.  Eyes:     Extraocular Movements: Extraocular movements intact.     Conjunctiva/sclera: Conjunctivae normal.     Pupils: Pupils are equal, round, and reactive to light.  Neck:     Musculoskeletal: Normal range of motion.  Cardiovascular:     Rate and Rhythm: Normal rate and regular rhythm.     Heart sounds: Normal heart sounds. No murmur.  Pulmonary:     Effort: Pulmonary effort is normal. No respiratory  distress.     Breath sounds: Normal breath sounds.  Abdominal:     General: Bowel sounds are normal. There is no distension.     Palpations: Abdomen is soft.  Musculoskeletal: Normal range of motion.  Skin:    General: Skin is warm and dry.  Neurological:     Mental Status: She is alert and oriented to person, place, and time.  Psychiatric:        Mood and Affect: Mood normal.        Behavior: Behavior normal.        Thought Content: Thought content normal.        Judgment: Judgment normal.     ASSESSMENT/PLAN:   1. Essential hypertension - Urinalysis Dipstick - Lipid Panel - Comprehensive metabolic panel - CBC with Differential  2. Constipation, unspecified constipation type - docusate sodium (COLACE) 100 MG capsule; Take 1 capsule (100 mg total) by mouth daily.  Dispense: 30 capsule; Refill: 2    Patient overall in good health. She is having mild constipation. No straining reported. She has slowed down her walking. Advised that increasing her walking may help with her constipation.    Return in about 3 months (around 05/10/2019) for HTN.    The patient was given clear instructions to go to ER or return to medical center if symptoms do not improve, worsen or new problems develop. The patient verbalized understanding and agreed with plan of care.   Ms. Carney Bern  Monica Becton, FNP-BC [facility] [address] [place], [place] [address] [phone]

## 2019-02-07 NOTE — Patient Instructions (Addendum)
It was good to see you today. You are such an inspiration. I will see you again in 3 months.    Insomnia Insomnia is a sleep disorder that makes it difficult to fall asleep or stay asleep. Insomnia can cause fatigue, low energy, difficulty concentrating, mood swings, and poor performance at work or school. There are three different ways to classify insomnia:  Difficulty falling asleep.  Difficulty staying asleep.  Waking up too early in the morning. Any type of insomnia can be long-term (chronic) or short-term (acute). Both are common. Short-term insomnia usually lasts for three months or less. Chronic insomnia occurs at least three times a week for longer than three months. What are the causes? Insomnia may be caused by another condition, situation, or substance, such as:  Anxiety.  Certain medicines.  Gastroesophageal reflux disease (GERD) or other gastrointestinal conditions.  Asthma or other breathing conditions.  Restless legs syndrome, sleep apnea, or other sleep disorders.  Chronic pain.  Menopause.  Stroke.  Abuse of alcohol, tobacco, or illegal drugs.  Mental health conditions, such as depression.  Caffeine.  Neurological disorders, such as Alzheimer's disease.  An overactive thyroid (hyperthyroidism). Sometimes, the cause of insomnia may not be known. What increases the risk? Risk factors for insomnia include:  Gender. Women are affected more often than men.  Age. Insomnia is more common as you get older.  Stress.  Lack of exercise.  Irregular work schedule or working night shifts.  Traveling between different time zones.  Certain medical and mental health conditions. What are the signs or symptoms? If you have insomnia, the main symptom is having trouble falling asleep or having trouble staying asleep. This may lead to other symptoms, such as:  Feeling fatigued or having low energy.  Feeling nervous about going to sleep.  Not feeling rested  in the morning.  Having trouble concentrating.  Feeling irritable, anxious, or depressed. How is this diagnosed? This condition may be diagnosed based on:  Your symptoms and medical history. Your health care provider may ask about: ? Your sleep habits. ? Any medical conditions you have. ? Your mental health.  A physical exam. How is this treated? Treatment for insomnia depends on the cause. Treatment may focus on treating an underlying condition that is causing insomnia. Treatment may also include:  Medicines to help you sleep.  Counseling or therapy.  Lifestyle adjustments to help you sleep better. Follow these instructions at home: Eating and drinking   Limit or avoid alcohol, caffeinated beverages, and cigarettes, especially close to bedtime. These can disrupt your sleep.  Do not eat a large meal or eat spicy foods right before bedtime. This can lead to digestive discomfort that can make it hard for you to sleep. Sleep habits   Keep a sleep diary to help you and your health care provider figure out what could be causing your insomnia. Write down: ? When you sleep. ? When you wake up during the night. ? How well you sleep. ? How rested you feel the next day. ? Any side effects of medicines you are taking. ? What you eat and drink.  Make your bedroom a dark, comfortable place where it is easy to fall asleep. ? Put up shades or blackout curtains to block light from outside. ? Use a white noise machine to block noise. ? Keep the temperature cool.  Limit screen use before bedtime. This includes: ? Watching TV. ? Using your smartphone, tablet, or computer.  Stick to a routine that  includes going to bed and waking up at the same times every day and night. This can help you fall asleep faster. Consider making a quiet activity, such as reading, part of your nighttime routine.  Try to avoid taking naps during the day so that you sleep better at night.  Get out of bed if  you are still awake after 15 minutes of trying to sleep. Keep the lights down, but try reading or doing a quiet activity. When you feel sleepy, go back to bed. General instructions  Take over-the-counter and prescription medicines only as told by your health care provider.  Exercise regularly, as told by your health care provider. Avoid exercise starting several hours before bedtime.  Use relaxation techniques to manage stress. Ask your health care provider to suggest some techniques that may work well for you. These may include: ? Breathing exercises. ? Routines to release muscle tension. ? Visualizing peaceful scenes.  Make sure that you drive carefully. Avoid driving if you feel very sleepy.  Keep all follow-up visits as told by your health care provider. This is important. Contact a health care provider if:  You are tired throughout the day.  You have trouble in your daily routine due to sleepiness.  You continue to have sleep problems, or your sleep problems get worse. Get help right away if:  You have serious thoughts about hurting yourself or someone else. If you ever feel like you may hurt yourself or others, or have thoughts about taking your own life, get help right away. You can go to your nearest emergency department or call:  Your local emergency services (911 in the [place]  A suicide crisis helpline, such as the National Suicide Prevention Lifeline at (848)773-66941-626-059-3762. This is open 24 hours a day. Summary  Insomnia is a sleep disorder that makes it difficult to fall asleep or stay asleep.  Insomnia can be long-term (chronic) or short-term (acute).  Treatment for insomnia depends on the cause. Treatment may focus on treating an underlying condition that is causing insomnia.  Keep a sleep diary to help you and your health care provider figure out what could be causing your insomnia. This information is not intended to replace advice given to you by your health care  provider. Make sure you discuss any questions you have with your health care provider. Document Released: 06/10/2000 Document Revised: 05/26/2017 Document Reviewed: 03/23/2017 Elsevier Patient Education  2020 [employer]  Living With Anxiety  After being diagnosed with an anxiety disorder, you may be relieved to know why you have felt or behaved a certain way. It is natural to also feel overwhelmed about the treatment ahead and what it will mean for your life. With care and support, you can manage this condition and recover from it. How to cope with anxiety Dealing with stress Stress is your body's reaction to life changes and events, both good and bad. Stress can last just a few hours or it can be ongoing. Stress can play a major role in anxiety, so it is important to learn both how to cope with stress and how to think about it differently. Talk with your health care provider or a counselor to learn more about stress reduction. He or she may suggest some stress reduction techniques, such as:  Music therapy. This can include creating or listening to music that you enjoy and that inspires you.  Mindfulness-based meditation. This involves being aware of your normal breaths, rather than trying to control your breathing.  It can be done while sitting or walking.  Centering prayer. This is a kind of meditation that involves focusing on a word, phrase, or sacred image that is meaningful to you and that brings you peace.  Deep breathing. To do this, expand your stomach and inhale slowly through your nose. Hold your breath for 3-5 seconds. Then exhale slowly, allowing your stomach muscles to relax.  Self-talk. This is a skill where you identify thought patterns that lead to anxiety reactions and correct those thoughts.  Muscle relaxation. This involves tensing muscles then relaxing them. Choose a stress reduction technique that fits your lifestyle and personality. Stress reduction techniques take  time and practice. Set aside 5-15 minutes a day to do them. Therapists can offer training in these techniques. The training may be covered by some insurance plans. Other things you can do to manage stress include:  Keeping a stress diary. This can help you learn what triggers your stress and ways to control your response.  Thinking about how you respond to certain situations. You may not be able to control everything, but you can control your reaction.  Making time for activities that help you relax, and not feeling guilty about spending your time in this way. Therapy combined with coping and stress-reduction skills provides the best chance for successful treatment. Medicines Medicines can help ease symptoms. Medicines for anxiety include:  Anti-anxiety drugs.  Antidepressants.  Beta-blockers. Medicines may be used as the main treatment for anxiety disorder, along with therapy, or if other treatments are not working. Medicines should be prescribed by a health care provider. Relationships Relationships can play a big part in helping you recover. Try to spend more time connecting with trusted friends and family members. Consider going to couples counseling, taking family education classes, or going to family therapy. Therapy can help you and others better understand the condition. How to recognize changes in your condition Everyone has a different response to treatment for anxiety. Recovery from anxiety happens when symptoms decrease and stop interfering with your daily activities at home or work. This may mean that you will start to:  Have better concentration and focus.  Sleep better.  Be less irritable.  Have more energy.  Have improved memory. It is important to recognize when your condition is getting worse. Contact your health care provider if your symptoms interfere with home or work and you do not feel like your condition is improving. Where to find help and support: You can  get help and support from these sources:  Self-help groups.  Online and [employer] organizations.  A trusted spiritual leader.  Couples counseling.  Family education classes.  Family therapy. Follow these instructions at home:  Eat a healthy diet that includes plenty of vegetables, fruits, whole grains, low-fat dairy products, and lean protein. Do not eat a lot of foods that are high in solid fats, added sugars, or salt.  Exercise. Most adults should do the following: ? Exercise for at least 150 minutes each week. The exercise should increase your heart rate and make you sweat (moderate-intensity exercise). ? Strengthening exercises at least twice a week.  Cut down on caffeine, tobacco, alcohol, and other potentially harmful substances.  Get the right amount and quality of sleep. Most adults need 7-9 hours of sleep each night.  Make choices that simplify your life.  Take over-the-counter and prescription medicines only as told by your health care provider.  Avoid caffeine, alcohol, and certain over-the-counter cold medicines. These may make you  feel worse. Ask your pharmacist which medicines to avoid.  Keep all follow-up visits as told by your health care provider. This is important. Questions to ask your health care provider  Would I benefit from therapy?  How often should I follow up with a health care provider?  How long do I need to take medicine?  Are there any long-term side effects of my medicine?  Are there any alternatives to taking medicine? Contact a health care provider if:  You have a hard time staying focused or finishing daily tasks.  You spend many hours a day feeling worried about everyday life.  You become exhausted by worry.  You start to have headaches, feel tense, or have nausea.  You urinate more than normal.  You have diarrhea. Get help right away if:  You have a racing heart and shortness of breath.  You have thoughts of hurting  yourself or others. If you ever feel like you may hurt yourself or others, or have thoughts about taking your own life, get help right away. You can go to your nearest emergency department or call:  Your local emergency services (911 in the [place]  A suicide crisis helpline, such as the [facility] at 657-777-1158. This is open 24-hours a day. Summary  Taking steps to deal with stress can help calm you.  Medicines cannot cure anxiety disorders, but they can help ease symptoms.  Family, friends, and partners can play a big part in helping you recover from an anxiety disorder. This information is not intended to replace advice given to you by your health care provider. Make sure you discuss any questions you have with your health care provider. Document Released: 06/07/2016 Document Revised: 05/26/2017 Document Reviewed: 06/07/2016 Elsevier Patient Education  2020 [employer].

## 2019-02-08 ENCOUNTER — Telehealth: Payer: Self-pay

## 2019-02-08 ENCOUNTER — Encounter: Payer: Self-pay | Admitting: Family Medicine

## 2019-02-08 LAB — CBC WITH DIFFERENTIAL/PLATELET
Basophils Absolute: 0.1 10*3/uL (ref 0.0–0.2)
Basos: 1 %
EOS (ABSOLUTE): 0.3 10*3/uL (ref 0.0–0.4)
Eos: 4 %
Hematocrit: 36.7 % (ref 34.0–46.6)
Hemoglobin: 12.1 g/dL (ref 11.1–15.9)
Immature Grans (Abs): 0 10*3/uL (ref 0.0–0.1)
Immature Granulocytes: 0 %
Lymphocytes Absolute: 1.8 10*3/uL (ref 0.7–3.1)
Lymphs: 25 %
MCH: 30.9 pg (ref 26.6–33.0)
MCHC: 33 g/dL (ref 31.5–35.7)
MCV: 94 fL (ref 79–97)
Monocytes Absolute: 0.4 10*3/uL (ref 0.1–0.9)
Monocytes: 5 %
Neutrophils Absolute: 4.4 10*3/uL (ref 1.4–7.0)
Neutrophils: 65 %
Platelets: 218 10*3/uL (ref 150–450)
RBC: 3.92 x10E6/uL (ref 3.77–5.28)
RDW: 12.3 % (ref 11.7–15.4)
WBC: 7 10*3/uL (ref 3.4–10.8)

## 2019-02-08 LAB — COMPREHENSIVE METABOLIC PANEL
ALT: 14 IU/L (ref 0–32)
AST: 23 IU/L (ref 0–40)
Albumin/Globulin Ratio: 1.8 (ref 1.2–2.2)
Albumin: 4.5 g/dL (ref 3.6–4.6)
Alkaline Phosphatase: 51 IU/L (ref 39–117)
BUN/Creatinine Ratio: 12 (ref 12–28)
BUN: 9 mg/dL (ref 8–27)
Bilirubin Total: 0.4 mg/dL (ref 0.0–1.2)
CO2: 26 mmol/L (ref 20–29)
Calcium: 9.2 mg/dL (ref 8.7–10.3)
Chloride: 95 mmol/L — ABNORMAL LOW (ref 96–106)
Creatinine, Ser: 0.77 mg/dL (ref 0.57–1.00)
GFR calc Af Amer: 81 mL/min/{1.73_m2} (ref >59)
GFR calc non Af Amer: 71 mL/min/{1.73_m2} (ref >59)
Globulin, Total: 2.5 g/dL (ref 1.5–4.5)
Glucose: 88 mg/dL (ref 65–99)
Potassium: 4 mmol/L (ref 3.5–5.2)
Sodium: 135 mmol/L (ref 134–144)
Total Protein: 7 g/dL (ref 6.0–8.5)

## 2019-02-08 LAB — LIPID PANEL
Chol/HDL Ratio: 1.8 ratio (ref 0.0–4.4)
Cholesterol, Total: 176 mg/dL (ref 100–199)
HDL: 98 mg/dL (ref >39)
LDL Calculated: 67 mg/dL (ref 0–99)
Triglycerides: 56 mg/dL (ref 0–149)
VLDL Cholesterol Cal: 11 mg/dL (ref 5–40)

## 2019-02-08 NOTE — Telephone Encounter (Signed)
Called, no answer. Left a message that all labs are stable and to continue current medications. Asked if any questions to call back to our office. Thanks!

## 2019-02-08 NOTE — Telephone Encounter (Signed)
-----   Message from Lanae Boast, [place] sent at 02/08/2019  2:00 PM EDT ----- Please let patient know that her labs are stable. Continue with current medications.

## 2019-02-24 ENCOUNTER — Encounter: Payer: Self-pay | Admitting: Surgery

## 2019-03-05 ENCOUNTER — Other Ambulatory Visit: Payer: Self-pay

## 2019-03-05 ENCOUNTER — Ambulatory Visit (INDEPENDENT_AMBULATORY_CARE_PROVIDER_SITE_OTHER): Payer: Medicare Other

## 2019-03-05 DIAGNOSIS — Z23 Encounter for immunization: Secondary | ICD-10-CM

## 2019-03-06 ENCOUNTER — Encounter (HOSPITAL_COMMUNITY): Payer: Self-pay

## 2019-03-06 ENCOUNTER — Encounter (HOSPITAL_COMMUNITY): Payer: Self-pay | Admitting: *Deleted

## 2019-04-03 ENCOUNTER — Other Ambulatory Visit: Payer: Self-pay

## 2019-04-03 ENCOUNTER — Ambulatory Visit
Admission: RE | Admit: 2019-04-03 | Discharge: 2019-04-03 | Disposition: A | Discharge status: Home / Self Care | Payer: Medicare Other | Source: Ambulatory Visit | Attending: Family Medicine | Admitting: Family Medicine

## 2019-04-03 DIAGNOSIS — Z1231 Encounter for screening mammogram for malignant neoplasm of breast: Secondary | ICD-10-CM

## 2019-04-04 ENCOUNTER — Encounter: Payer: Self-pay | Admitting: Gynecology

## 2019-04-18 ENCOUNTER — Telehealth: Payer: Self-pay | Admitting: Internal Medicine

## 2019-04-18 NOTE — Telephone Encounter (Signed)
Called and left message.

## 2019-05-07 ENCOUNTER — Other Ambulatory Visit: Payer: Self-pay

## 2019-05-07 ENCOUNTER — Ambulatory Visit (INDEPENDENT_AMBULATORY_CARE_PROVIDER_SITE_OTHER): Payer: Medicare Other | Admitting: Family Medicine

## 2019-05-07 ENCOUNTER — Encounter: Payer: Self-pay | Admitting: Family Medicine

## 2019-05-07 VITALS — BP 137/57 | HR 79 | Temp 98.1°F | Resp 14 | Ht <= 58 in | Wt 119.0 lb

## 2019-05-07 DIAGNOSIS — E785 Hyperlipidemia, unspecified: Secondary | ICD-10-CM

## 2019-05-07 DIAGNOSIS — I10 Essential (primary) hypertension: Secondary | ICD-10-CM

## 2019-05-07 DIAGNOSIS — J4520 Mild intermittent asthma, uncomplicated: Secondary | ICD-10-CM | POA: Diagnosis not present

## 2019-05-07 LAB — POCT URINALYSIS DIPSTICK
Bilirubin, UA: NEGATIVE
Blood, UA: NEGATIVE
Glucose, UA: NEGATIVE
Ketones, UA: NEGATIVE
Leukocytes, UA: NEGATIVE
Nitrite, UA: NEGATIVE
Protein, UA: NEGATIVE
Spec Grav, UA: 1.015 (ref 1.010–1.025)
Urobilinogen, UA: 1 E.U./dL
pH, UA: 7 (ref 5.0–8.0)

## 2019-05-07 NOTE — Progress Notes (Signed)
Established Patient Office Visit  Subjective:  Patient ID: Sarah Valenzuela, female    DOB: [birth year]  Age: 85 y.o. MRN: [id number]  CC:  Chief Complaint  Patient presents with  . Hypertension  . Hyperlipidemia   Sarah Valenzuela, a very pleasant 85 year old female with a medical history significant for hypertension, hyperlipidemia, mild intermittent asthma, and seasonal depression presents for follow-up of chronic conditions.  Patient states that she has been doing well and is currently without complaint.  She has been taking all medications consistently including antihypertensives.  She does not check blood pressure at home.  She generally follows a low-sodium diet.  She says that she is not been exercising due to Covid pandemic.  She was involved in exercise groups at the local [employer] prior to pandemic.She tries to remain active by taking short walks.  She currently denies headache, chest pain, heart palpitations,, dizziness, blurred vision, shortness of breath, or lower extremity edema.  Patient also has a history of mild intermittent asthma.  She states that it has been many years since an asthma exacerbation.  She typically does not have to use albuterol.  Asthma is triggered by dust, animal dander, and trees.  She endorses seasonal allergies that are she consistently takes Advair.  Controlled by cetirizine daily.  She denies persistent cough, wheezing, and/or shortness of breath.  Past Medical History:  Diagnosis Date  . Asthma   . Hypercholesteremia   . Hyperlipidemia   . Hypertension   . Osteoporosis 02/2017   T score -3.1  . Situational stress     History reviewed. No pertinent surgical history.  Family History  Problem Relation Age of Onset  . Cancer Brother        Prostate  . Breast cancer Sister        UNSURE  . Diabetes Sister   . Heart disease Sister     Social History   Socioeconomic History  . Marital status: Widowed    Spouse name: Not on file   . Number of children: Not on file  . Years of education: Not on file  . Highest education level: Not on file  Occupational History  . Not on file  Social Needs  . Financial resource strain: Not on file  . Food insecurity    Worry: Not on file    Inability: Not on file  . Transportation needs    Medical: Not on file    Non-medical: Not on file  Tobacco Use  . Smoking status: Never Smoker  . Smokeless tobacco: Never Used  Substance and Sexual Activity  . Alcohol use: No  . Drug use: No  . Sexual activity: Never  Lifestyle  . Physical activity    Days per week: Not on file    Minutes per session: Not on file  . Stress: Not on file  Relationships  . Social Musician on phone: Not on file    Gets together: Not on file    Attends religious service: Not on file    Active member of club or organization: Not on file    Attends meetings of clubs or organizations: Not on file    Relationship status: Not on file  . Intimate partner violence    Fear of current or ex partner: Not on file    Emotionally abused: Not on file    Physically abused: Not on file    Forced sexual activity: Not on file  Other Topics Concern  .  Not on file  Social History Narrative  . Not on file    Outpatient Medications Prior to Visit  Medication Sig Dispense Refill  . acetaminophen (TYLENOL) 500 MG tablet Take 1 tablet (500 mg total) by mouth every 6 (six) hours as needed for pain. 30 tablet 0  . aspirin 81 MG chewable tablet Chew 81 mg by mouth daily.    . calcium citrate (CALCITRATE - DOSED IN MG ELEMENTAL CALCIUM) 950 MG tablet Take 1 tablet by mouth 2 (two) times daily.    . cetirizine (ZYRTEC) 10 MG tablet Take 10 mg by mouth daily.    . clobetasol ointment (TEMOVATE) 0.05 % APPLY TOPICALY DAILY FOR 2 WEEKS THEN USE TWICE WEEKLY LONG TERM. 30 g 3  . cycloSPORINE (RESTASIS) 0.05 % ophthalmic emulsion Place 1 drop into both eyes 2 (two) times daily.    Marland Kitchen docusate sodium (COLACE) 100 MG  capsule Take 1 capsule (100 mg total) by mouth daily. 30 capsule 2  . fluticasone (FLONASE) 50 MCG/ACT nasal spray Place 2 sprays into both nostrils daily. 16 g 5  . Fluticasone-Salmeterol (ADVAIR) 100-50 MCG/DOSE AEPB Inhale 1 puff into the lungs 2 (two) times daily. 180 each 3  . ipratropium (ATROVENT HFA) 17 MCG/ACT inhaler USE 2 PUFFS 3 TIMES DAILY AS NEEDED FOR SHORTNESS OF BREATH. 25.8 g 5  . rosuvastatin (CRESTOR) 5 MG tablet Take 5 mg by mouth daily.    . SENNOSIDES PO Take 8.5 mg by mouth as needed (Swiss Kriss OTC Laxative).    . triamterene-hydrochlorothiazide (MAXZIDE-25) 37.5-25 MG tablet Take 0.5 tablets by mouth daily. 45 tablet 3  . Vaginal Moisturizer (VAGISIL FEMININE MOISTURIZER) LOTN Place vaginally 3 (three) times daily.    . zafirlukast (ACCOLATE) 20 MG tablet Take 1 tablet (20 mg total) by mouth 2 (two) times daily. 180 tablet 3   No facility-administered medications prior to visit.     Allergies  Allergen Reactions  . Penicillins Other (See Comments)    Breaks out. Longtime allergy    ROS Review of Systems  Constitutional: Negative.   HENT: Negative.  Negative for congestion, sinus pressure and sinus pain.   Eyes: Negative.  Negative for visual disturbance.  Respiratory: Negative.  Negative for shortness of breath.   Cardiovascular: Negative.  Negative for chest pain, palpitations and leg swelling.  Gastrointestinal: Negative.  Negative for abdominal pain, constipation and nausea.  Endocrine: Negative.  Negative for polydipsia, polyphagia and polyuria.  Genitourinary: Negative.  Negative for dysuria.  Musculoskeletal: Negative.  Negative for arthralgias and joint swelling.  Neurological: Negative.  Negative for dizziness and headaches.  Hematological: Negative.   Psychiatric/Behavioral: Negative.       Objective:    Physical Exam  Constitutional: She is oriented to person, place, and time. She appears well-developed and well-nourished.  HENT:  Head:  Normocephalic and atraumatic.  Eyes: Pupils are equal, round, and reactive to light.  Neck: Normal range of motion.  Cardiovascular: Normal rate and regular rhythm.  Pulmonary/Chest: Effort normal and breath sounds normal.  Abdominal: Soft. Bowel sounds are normal.  Musculoskeletal: Normal range of motion.  Neurological: She is alert and oriented to person, place, and time.  Skin: Skin is warm and dry.  Psychiatric: She has a normal mood and affect. Her behavior is normal. Judgment and thought content normal.    BP (!) 137/57 (BP Location: Left Arm, Patient Position: Sitting, Cuff Size: Normal)   Pulse 79   Temp 98.1 F (36.7 C) (Oral)   Resp  14   Ht 4\' 9"  (1.448 m)   Wt 119 lb (54 kg)   SpO2 100%   BMI 25.75 kg/m  Wt Readings from Last 3 Encounters:  05/07/19 119 lb (54 kg)  02/07/19 119 lb (54 kg)  05/14/18 121 lb (54.9 kg)     There are no preventive care reminders to display for this patient.  There are no preventive care reminders to display for this patient.  Lab Results  Component Value Date   TSH 1.75 10/17/2012   Lab Results  Component Value Date   WBC 7.0 02/07/2019   HGB 12.1 02/07/2019   HCT 36.7 02/07/2019   MCV 94 02/07/2019   PLT 218 02/07/2019   Lab Results  Component Value Date   NA 135 02/07/2019   K 4.0 02/07/2019   CO2 26 02/07/2019   GLUCOSE 88 02/07/2019   BUN 9 02/07/2019   CREATININE 0.77 02/07/2019   BILITOT 0.4 02/07/2019   ALKPHOS 51 02/07/2019   AST 23 02/07/2019   ALT 14 02/07/2019   PROT 7.0 02/07/2019   ALBUMIN 4.5 02/07/2019   CALCIUM 9.2 02/07/2019   Lab Results  Component Value Date   CHOL 176 02/07/2019   Lab Results  Component Value Date   HDL 98 02/07/2019   Lab Results  Component Value Date   LDLCALC 67 02/07/2019   Lab Results  Component Value Date   TRIG 56 02/07/2019   Lab Results  Component Value Date   CHOLHDL 1.8 02/07/2019   Lab Results  Component Value Date   HGBA1C 5.1 04/22/2016       Assessment & Plan:   Problem List Items Addressed This Visit      Cardiovascular and Mediastinum   HTN (hypertension) - Primary   Relevant Orders   Urinalysis Dipstick      Essential hypertension Blood pressure controlled.  No medication changes warranted at this time. Continue medication, monitor blood pressure at home. Continue DASH diet. Reminder to go to the ER if any CP, SOB, nausea, dizziness, severe HA, changes vision/speech, left arm numbness and tingling and jaw pain.  Review urinalysis, no proteinuria noted.  - Urinalysis Dipstick  Mild intermittent asthma without complication Continue daily Advair.  Avoid asthma triggers.  Continue daily cetirizine.    Hyperlipidemia, unspecified hyperlipidemia type Continue low-dose Crestor.  Also, patient advised to continue low-fat, low-sodium diet.  Reviewed previous cholesterol panel within normal range.  Repeat in 3 months   Health maintenance: Patient is up-to-date with all vaccinations   Follow-up: 3 months for chronic conditions  Sarah Demeritt Rennis Petty  APRN, MSN, FNP-C [facility] [address] [place], [place] [address] [phone]

## 2019-05-07 NOTE — Patient Instructions (Signed)
Please increase daily activities, but stay safe.   Increase your daily water intake and fiber, add fruit as discussed.    Continue to stay active with your church online.   We will see each other and do fasting labs in 3 months.    It was a pleasure seeing you on today

## 2019-05-09 ENCOUNTER — Ambulatory Visit: Payer: Medicare Other | Admitting: Family Medicine

## 2019-05-16 ENCOUNTER — Encounter: Payer: Medicare Other | Admitting: Obstetrics & Gynecology

## 2019-05-17 ENCOUNTER — Other Ambulatory Visit: Payer: Self-pay

## 2019-05-17 DIAGNOSIS — J302 Other seasonal allergic rhinitis: Secondary | ICD-10-CM

## 2019-05-17 MED ORDER — FLUTICASONE PROPIONATE 50 MCG/ACT NA SUSP: 2.0000 | Freq: Every day | NASAL | 5 refills | Status: DC

## 2019-07-12 ENCOUNTER — Ambulatory Visit: Payer: Medicare Other | Attending: Internal Medicine

## 2019-07-12 DIAGNOSIS — Z23 Encounter for immunization: Secondary | ICD-10-CM

## 2019-07-12 NOTE — Progress Notes (Signed)
   Covid-19 Vaccination Clinic  Name:  Sarah Valenzuela    MRN: [id number] DOB: 1932-01-16  07/12/2019  Ms. [name] was observed post Covid-19 immunization for 15 minutes without incidence. She was provided with Vaccine Information Sheet and instruction to access the V-Safe system.   Ms. [name] was instructed to call 911 with any severe reactions post vaccine: Marland Kitchen Difficulty breathing  . Swelling of your face and throat  . A fast heartbeat  . A bad rash all over your body  . Dizziness and weakness    Immunizations Administered    Name Date Dose VIS Date Route   Pfizer COVID-19 Vaccine 07/12/2019  8:51 AM 0.3 mL 06/07/2019 Intramuscular   Manufacturer: [employer], [employer]   Lot: [id number]   NDC: [address]-7

## 2019-07-23 ENCOUNTER — Encounter: Payer: Self-pay | Admitting: Family Medicine

## 2019-07-23 ENCOUNTER — Ambulatory Visit: Payer: Medicare Other | Admitting: Family Medicine

## 2019-07-23 ENCOUNTER — Ambulatory Visit (INDEPENDENT_AMBULATORY_CARE_PROVIDER_SITE_OTHER): Payer: Medicare PPO | Admitting: Family Medicine

## 2019-07-23 ENCOUNTER — Other Ambulatory Visit: Payer: Self-pay

## 2019-07-23 VITALS — BP 128/58 | HR 76 | Temp 98.5°F | Resp 14 | Ht <= 58 in | Wt 114.0 lb

## 2019-07-23 DIAGNOSIS — E46 Unspecified protein-calorie malnutrition: Secondary | ICD-10-CM

## 2019-07-23 DIAGNOSIS — I10 Essential (primary) hypertension: Secondary | ICD-10-CM

## 2019-07-23 DIAGNOSIS — E785 Hyperlipidemia, unspecified: Secondary | ICD-10-CM | POA: Diagnosis not present

## 2019-07-23 DIAGNOSIS — J4520 Mild intermittent asthma, uncomplicated: Secondary | ICD-10-CM

## 2019-07-23 DIAGNOSIS — R630 Anorexia: Secondary | ICD-10-CM

## 2019-07-23 DIAGNOSIS — H9193 Unspecified hearing loss, bilateral: Secondary | ICD-10-CM

## 2019-07-23 LAB — POCT URINALYSIS DIPSTICK
Bilirubin, UA: NEGATIVE
Blood, UA: NEGATIVE
Glucose, UA: NEGATIVE
Ketones, UA: NEGATIVE
Nitrite, UA: NEGATIVE
Protein, UA: NEGATIVE
Spec Grav, UA: 1.02 (ref 1.010–1.025)
Urobilinogen, UA: 0.2 E.U./dL
pH, UA: 8 (ref 5.0–8.0)

## 2019-07-23 MED ORDER — ENSURE COMPLETE PO LIQD: 237.0000 mL | Freq: Two times a day (BID) | ORAL | 12 refills | Status: DC

## 2019-07-23 NOTE — Progress Notes (Signed)
[facility]   Established Patient Office Visit  Subjective:  Patient ID: Sarah Valenzuela, female    DOB: 05-08-1932  Age: 86 y.o. MRN: [id number]  CC:  Chief Complaint  Patient presents with  . Hypertension    Sahiti Joswick, a very pleasant 86 year old female with a history of essential hypertension, mild intermittent asthma, hypercholesterolemia, and history of osteoporosis presents for follow-up of chronic conditions.  Patient states that she has been feeling well and has minimal complaints.  She says that she has been less active than usual since isolating due to COVID-19 pandemic.  Also, patient is complaining of decreased appetite.  She states that she has not had a desire to eat over the past several days.  It is usually limited to 1-2 meals daily.  She says that she typically forces herself to eat.  She denies any abdominal pain, nausea, vomiting, diarrhea, early satiety, heartburn, hematochezia, or constipation.  Hypertension This is a chronic problem. The problem is controlled. Pertinent negatives include no chest pain, orthopnea or palpitations. There are no compliance problems.      Past Medical History:  Diagnosis Date  . Asthma   . Hypercholesteremia   . Hyperlipidemia   . Hypertension   . Osteoporosis 02/2017   T score -3.1  . Situational stress     History reviewed. No pertinent surgical history.  Family History  Problem Relation Age of Onset  . Cancer Brother        Prostate  . Breast cancer Sister        UNSURE  . Diabetes Sister   . Heart disease Sister     Social History   Socioeconomic History  . Marital status: Widowed    Spouse name: Not on file  . Number of children: Not on file  . Years of education: Not on file  . Highest education level: Not on file  Occupational History  . Not on file  Tobacco Use  . Smoking status: Never Smoker  . Smokeless tobacco: Never Used  Substance and  Sexual Activity  . Alcohol use: No  . Drug use: No  . Sexual activity: Never  Other Topics Concern  . Not on file  Social History Narrative  . Not on file   Social Determinants of Health   Financial Resource Strain:   . Difficulty of Paying Living Expenses: Not on file  Food Insecurity:   . Worried About Programme researcher, broadcasting/film/video in the Last Year: Not on file  . Ran Out of Food in the Last Year: Not on file  Transportation Needs:   . Lack of Transportation (Medical): Not on file  . Lack of Transportation (Non-Medical): Not on file  Physical Activity:   . Days of Exercise per Week: Not on file  . Minutes of Exercise per Session: Not on file  Stress:   . Feeling of Stress : Not on file  Social Connections:   . Frequency of Communication with Friends and Family: Not on file  . Frequency of Social Gatherings with Friends and Family: Not on file  . Attends Religious Services: Not on file  . Active Member of Clubs or Organizations: Not on file  . Attends Banker Meetings: Not on file  . Marital Status: Not on file  Intimate Partner Violence:   . Fear of Current or Ex-Partner: Not on file  . Emotionally Abused: Not on file  . Physically Abused: Not  on file  . Sexually Abused: Not on file    Outpatient Medications Prior to Visit  Medication Sig Dispense Refill  . acetaminophen (TYLENOL) 500 MG tablet Take 1 tablet (500 mg total) by mouth every 6 (six) hours as needed for pain. 30 tablet 0  . aspirin 81 MG chewable tablet Chew 81 mg by mouth daily.    . calcium citrate (CALCITRATE - DOSED IN MG ELEMENTAL CALCIUM) 950 MG tablet Take 1 tablet by mouth 2 (two) times daily.    . cetirizine (ZYRTEC) 10 MG tablet Take 10 mg by mouth daily.    . clobetasol ointment (TEMOVATE) 0.05 % APPLY TOPICALY DAILY FOR 2 WEEKS THEN USE TWICE WEEKLY LONG TERM. 30 g 3  . cycloSPORINE (RESTASIS) 0.05 % ophthalmic emulsion Place 1 drop into both eyes 2 (two) times daily.    . fluticasone  (FLONASE) 50 MCG/ACT nasal spray Place 2 sprays into both nostrils daily. 16 g 5  . Fluticasone-Salmeterol (ADVAIR) 100-50 MCG/DOSE AEPB Inhale 1 puff into the lungs 2 (two) times daily. 180 each 3  . ipratropium (ATROVENT HFA) 17 MCG/ACT inhaler USE 2 PUFFS 3 TIMES DAILY AS NEEDED FOR SHORTNESS OF BREATH. 25.8 g 5  . rosuvastatin (CRESTOR) 5 MG tablet Take 5 mg by mouth daily.    . SENNOSIDES PO Take 8.5 mg by mouth as needed (Swiss Kriss OTC Laxative).    . triamterene-hydrochlorothiazide (MAXZIDE-25) 37.5-25 MG tablet Take 0.5 tablets by mouth daily. 45 tablet 3  . Vaginal Moisturizer (VAGISIL FEMININE MOISTURIZER) LOTN Place vaginally 3 (three) times daily.    . zafirlukast (ACCOLATE) 20 MG tablet Take 1 tablet (20 mg total) by mouth 2 (two) times daily. 180 tablet 3  . docusate sodium (COLACE) 100 MG capsule Take 1 capsule (100 mg total) by mouth daily. (Patient not taking: Reported on 07/23/2019) 30 capsule 2   No facility-administered medications prior to visit.    Allergies  Allergen Reactions  . Penicillins Other (See Comments)    Breaks out. Longtime allergy    ROS Review of Systems  Constitutional: Negative for activity change and appetite change.  HENT: Negative.   Eyes: Negative.  Negative for photophobia, redness and visual disturbance.  Respiratory: Negative.   Cardiovascular: Negative.  Negative for chest pain, palpitations, orthopnea and leg swelling.  Genitourinary: Negative.   Musculoskeletal: Negative.   Skin: Negative.   Allergic/Immunologic: Negative for food allergies and immunocompromised state.  Neurological: Negative.  Negative for dizziness and facial asymmetry.  Hematological: Negative.   Psychiatric/Behavioral: Negative.       Objective:    Physical Exam  Constitutional: She is oriented to person, place, and time.  Eyes: Pupils are equal, round, and reactive to light.  Pulmonary/Chest: Effort normal and breath sounds normal.  Abdominal: Soft.  Bowel sounds are normal.  Musculoskeletal:        General: Normal range of motion.     Cervical back: Normal range of motion.  Neurological: She is alert and oriented to person, place, and time.  Skin: Skin is warm.  Psychiatric: She has a normal mood and affect. Her behavior is normal. Judgment and thought content normal.    BP (!) 137/55 (BP Location: Right Arm, Patient Position: Sitting, Cuff Size: Normal)   Pulse 76   Temp 98.5 F (36.9 C) (Oral)   Resp 14   Ht 4\' 9"  (1.448 m)   Wt 114 lb (51.7 kg)   SpO2 100%   BMI 24.67 kg/m  Wt Readings from Last  3 Encounters:  07/23/19 114 lb (51.7 kg)  05/07/19 119 lb (54 kg)  02/07/19 119 lb (54 kg)     There are no preventive care reminders to display for this patient.  There are no preventive care reminders to display for this patient.  Lab Results  Component Value Date   TSH 1.75 10/17/2012   Lab Results  Component Value Date   WBC 7.0 02/07/2019   HGB 12.1 02/07/2019   HCT 36.7 02/07/2019   MCV 94 02/07/2019   PLT 218 02/07/2019   Lab Results  Component Value Date   NA 135 02/07/2019   K 4.0 02/07/2019   CO2 26 02/07/2019   GLUCOSE 88 02/07/2019   BUN 9 02/07/2019   CREATININE 0.77 02/07/2019   BILITOT 0.4 02/07/2019   ALKPHOS 51 02/07/2019   AST 23 02/07/2019   ALT 14 02/07/2019   PROT 7.0 02/07/2019   ALBUMIN 4.5 02/07/2019   CALCIUM 9.2 02/07/2019   Lab Results  Component Value Date   CHOL 176 02/07/2019   Lab Results  Component Value Date   HDL 98 02/07/2019   Lab Results  Component Value Date   LDLCALC 67 02/07/2019   Lab Results  Component Value Date   TRIG 56 02/07/2019   Lab Results  Component Value Date   CHOLHDL 1.8 02/07/2019   Lab Results  Component Value Date   HGBA1C 5.1 04/22/2016      Assessment & Plan:   Problem List Items Addressed This Visit      Cardiovascular and Mediastinum   HTN (hypertension) - Primary   Relevant Orders   Urinalysis Dipstick (Completed)     Other Visit Diagnoses    Decreased appetite       Relevant Medications   feeding supplement, ENSURE COMPLETE, (ENSURE COMPLETE) LIQD   Malnutrition, unspecified type ([place])       Relevant Medications   feeding supplement, ENSURE COMPLETE, (ENSURE COMPLETE) LIQD      Meds ordered this encounter  Medications  . feeding supplement, ENSURE COMPLETE, (ENSURE COMPLETE) LIQD    Sig: Take 237 mLs by mouth 2 (two) times daily between meals. Two bottles daily    Dispense:  13272 mL    Refill:  12    Order Specific Question:   Supervising Provider    Answer:   Tresa Garter [[id number]]    Essential hypertension - Continue medication, monitor blood pressure at home. Continue DASH diet. Reminder to go to the ER if any CP, SOB, nausea, dizziness, severe HA, changes vision/speech, left arm numbness and tingling and jaw pain.    - Urinalysis Dipstick - Basic Metabolic Panel  Decreased appetite - feeding supplement, ENSURE COMPLETE, (ENSURE COMPLETE) LIQD; Take 237 mLs by mouth 2 (two) times daily between meals. Two bottles daily  Dispense: 13272 mL; Refill: 12 - Basic Metabolic Panel  Malnutrition, unspecified type ([place]) - feeding supplement, ENSURE COMPLETE, (ENSURE COMPLETE) LIQD; Take 237 mLs by mouth 2 (two) times daily between meals. Two bottles daily  Dispense: 13272 mL; Refill: 12 - Basic Metabolic Panel   Hyperlipidemia, unspecified hyperlipidemia type Will continue Crestor 5 mg daily, no changes warranted.   Decreased hearing of both ears Referral sent to audiology previously  Mild intermittent asthma without complication Stable. No medication changes warranted on today.   Follow-up: Return in about 3 months (around 10/21/2019) for hypertension.   [name]  APRN, MSN, FNP-C [facility] [place]  [place], [place] [address] [phone]

## 2019-07-23 NOTE — Patient Instructions (Signed)
Recommend Ensure daily in between meals.   Protein-Energy Malnutrition Protein-energy malnutrition is when a person does not eat enough protein, fat, and calories. When this happens over time, it can lead to severe loss of muscle tissue (muscle wasting). This condition also affects the body's defense system (immune system) and can lead to other health problems. What are the causes? This condition may be caused by:  Not eating enough protein, fat, or calories.  Having certain chronic medical conditions.  Eating too little. What increases the risk? The following factors may make you more likely to develop this condition:  Living in poverty.  Long-term hospitalization.  Alcohol or drug dependency. Addiction often leads to a lifestyle in which proper diet is ignored. Dependency can also hurt the metabolism and the body's ability to absorb nutrients.  Eating disorders, such as anorexia nervosa or bulimia.  Chewing or swallowing problems. People with these disorders may not eat enough.  Having certain conditions, such as: ? Inflammatory bowel disease. Inflammation of the intestines makes it difficult for the body to absorb nutrients. ? Cancer or AIDS. These diseases can cause a loss of appetite. ? Chronic heart failure. This interferes with how the body uses nutrients. ? Cystic fibrosis. This disease can make it difficult for the body to absorb nutrients.  Eating a diet that extremely restricts protein, fat, or calorie intake. What are the signs or symptoms? Symptoms of this condition include:  Fatigue.  Weakness.  Dizziness.  Fainting.  Weight loss.  Loss of muscle tone and muscle mass.  Poor immune response.  Lack of menstruation.  Poor memory.  Hair loss.  Skin changes. How is this diagnosed? This condition may be diagnosed based on:  Your medical and dietary history.  A physical exam. This may include a measurement of your body mass index (BMI).  Blood  tests. How is this treated? This condition may be managed with:  Nutrition therapy. This may include working with a diet and nutrition specialist (dietitian).  Treatment for underlying conditions. People with severe protein-energy malnutrition may need to be treated in a hospital. This may involve receiving nutrition and fluids through an IV. Follow these instructions at home:   Eat a balanced diet. In each meal, include at least one food that is high in protein. Foods that are high in protein include: ? Meat. ? Poultry. ? Fish. ? Eggs. ? Cheese. ? Milk. ? Beans. ? Nuts.  Eat nutrient-rich foods that are easy to swallow and digest, such as: ? Fruit and yogurt smoothies. ? Oatmeal with nut butter.  Try to eat six small meals each day instead of three large meals.  Take vitamin and protein supplements as told by your health care provider or dietitian.  Follow your health care provider's recommendations about exercise and activity.  Keep all follow-up visits as told by your health care provider. This is important. Contact a health care provider if you:  Have increased weakness or fatigue.  Faint.  Are a woman and you stop having your period (menstruating).  Have rapid hair loss.  Have unexpected weight loss.  Have diarrhea.  Have nausea and vomiting. Get help right away if you have:  Difficulty breathing.  Chest pain. Summary  Protein-energy malnutrition is when a person does not eat enough protein, fat, and calories.  Protein-energy malnutrition can lead to severe loss of muscle tissue (muscle wasting). This condition also affects the body's defense system (immune system) and can lead to other health problems.  Talk with  your health care provider about treatment for this condition. Effective treatment depends on the underlying cause of the malnutrition. This information is not intended to replace advice given to you by your health care provider. Make sure you  discuss any questions you have with your health care provider. Document Revised: 06/28/2017 Document Reviewed: 06/28/2017 Elsevier Patient Education  2020 [employer].

## 2019-07-24 LAB — BASIC METABOLIC PANEL
BUN/Creatinine Ratio: 11 — ABNORMAL LOW (ref 12–28)
BUN: 7 mg/dL — ABNORMAL LOW (ref 8–27)
CO2: 23 mmol/L (ref 20–29)
Calcium: 9.3 mg/dL (ref 8.7–10.3)
Chloride: 96 mmol/L (ref 96–106)
Creatinine, Ser: 0.65 mg/dL (ref 0.57–1.00)
GFR calc Af Amer: 94 mL/min/{1.73_m2} (ref >59)
GFR calc non Af Amer: 81 mL/min/{1.73_m2} (ref >59)
Glucose: 90 mg/dL (ref 65–99)
Potassium: 3.7 mmol/L (ref 3.5–5.2)
Sodium: 135 mmol/L (ref 134–144)

## 2019-07-25 ENCOUNTER — Telehealth: Payer: Self-pay

## 2019-07-25 NOTE — Telephone Encounter (Signed)
-----   Message from Massie Maroon, [place] sent at 07/25/2019  8:43 AM EST ----- Regarding: lab results Please inform patient that labs are within a normal range. - Continue medications, monitor blood pressure when possible. Continue low salt diet. Reminder to go to the ER if any CP, SOB, nausea, dizziness, severe HA, changes vision/speech, left arm numbness and tingling and jaw pain.  Follow up in office as scheduled   Nolon Nations  APRN, MSN, FNP-C [facility] [address] [place], [place] [address] [phone]

## 2019-07-25 NOTE — Telephone Encounter (Signed)
Called no answer. Left a voicemail that all labs are normal and to follow up as scheduled. Asked if any questions that she call us back. Thanks!

## 2019-07-31 ENCOUNTER — Ambulatory Visit: Payer: Medicare PPO | Attending: Internal Medicine

## 2019-07-31 DIAGNOSIS — Z23 Encounter for immunization: Secondary | ICD-10-CM

## 2019-07-31 NOTE — Progress Notes (Signed)
   Covid-19 Vaccination Clinic  Name:  Sarah Valenzuela    MRN: [id number] DOB: Mar 28, 1932  07/31/2019  [name] was observed post Covid-19 immunization for 15 minutes without incidence. She was provided with Vaccine Information Sheet and instruction to access the V-Safe system.   [name] was instructed to call 911 with any severe reactions post vaccine: Marland Kitchen Difficulty breathing  . Swelling of your face and throat  . A fast heartbeat  . A bad rash all over your body  . Dizziness and weakness    Immunizations Administered    Name Date Dose VIS Date Route   Pfizer COVID-19 Vaccine 07/31/2019  8:21 AM 0.3 mL 06/07/2019 Intramuscular   Manufacturer: [employer], [employer]   Lot: [id number]   NDC: [address]-4

## 2019-09-04 ENCOUNTER — Other Ambulatory Visit: Payer: Self-pay

## 2019-09-05 ENCOUNTER — Ambulatory Visit: Payer: Medicare PPO | Admitting: Obstetrics & Gynecology

## 2019-09-05 ENCOUNTER — Encounter: Payer: Self-pay | Admitting: Obstetrics & Gynecology

## 2019-09-05 VITALS — BP 126/60 | Ht <= 58 in | Wt 115.0 lb

## 2019-09-05 DIAGNOSIS — Z01419 Encounter for gynecological examination (general) (routine) without abnormal findings: Secondary | ICD-10-CM

## 2019-09-05 DIAGNOSIS — M810 Age-related osteoporosis without current pathological fracture: Secondary | ICD-10-CM

## 2019-09-05 DIAGNOSIS — N904 Leukoplakia of vulva: Secondary | ICD-10-CM

## 2019-09-05 DIAGNOSIS — Z780 Asymptomatic menopausal state: Secondary | ICD-10-CM | POA: Diagnosis not present

## 2019-09-05 MED ORDER — CLOBETASOL PROPIONATE 0.05 % EX OINT: TOPICAL_OINTMENT | CUTANEOUS | 4 refills | Status: DC

## 2019-09-05 NOTE — Progress Notes (Signed)
Sarah Valenzuela 10/18/1931 [id number]   History:    86 y.o. G1P1L1 Widowed.  Daughter lives in [place].  RP:  Established patient presenting for annual gyn exam   HPI: Very active at church and stays physically fit, was exercising at the [facility] regularly, now doing it at home.  Menopause, well on no hormone replacement therapy.  No postmenopausal bleeding.  No pelvic pain. Lichen Sclerosus, doing very well on Clobetasol ointment twice a week.   Abstinent.  Urine and bowel movements normal.  Breasts normal.  Body mass index 24.89.  Health labs with family physician.  Past medical history,surgical history, family history and social history were all reviewed and documented in the EPIC chart.  Gynecologic History No LMP recorded. Patient is postmenopausal.  Obstetric History OB History  Gravida Para Term Preterm AB Living  1 1     0 1  SAB TAB Ectopic Multiple Live Births      0        # Outcome Date GA Lbr Len/2nd Weight Sex Delivery Anes PTL Lv  1 Para              ROS: A ROS was performed and pertinent positives and negatives are included in the history.  GENERAL: No fevers or chills. HEENT: No change in vision, no earache, sore throat or sinus congestion. NECK: No pain or stiffness. CARDIOVASCULAR: No chest pain or pressure. No palpitations. PULMONARY: No shortness of breath, cough or wheeze. GASTROINTESTINAL: No abdominal pain, nausea, vomiting or diarrhea, melena or bright red blood per rectum. GENITOURINARY: No urinary frequency, urgency, hesitancy or dysuria. MUSCULOSKELETAL: No joint or muscle pain, no back pain, no recent trauma. DERMATOLOGIC: No rash, no itching, no lesions. ENDOCRINE: No polyuria, polydipsia, no heat or cold intolerance. No recent change in weight. HEMATOLOGICAL: No anemia or easy bruising or bleeding. NEUROLOGIC: No headache, seizures, numbness, tingling or weakness. PSYCHIATRIC: No depression, no loss of interest in normal activity or change in sleep  pattern.     Exam:   BP 126/60 (BP Location: Right Arm, Patient Position: Sitting, Cuff Size: Normal)   Ht 4\' 9"  (1.448 m)   Wt 115 lb (52.2 kg)   BMI 24.89 kg/m   Body mass index is 24.89 kg/m.  General appearance : Well developed well nourished female. No acute distress HEENT: Eyes: no retinal hemorrhage or exudates,  Neck supple, trachea midline, no carotid bruits, no thyroidmegaly Lungs: Clear to auscultation, no rhonchi or wheezes, or rib retractions  Heart: Regular rate and rhythm, no murmurs or gallops Breast:Examined in sitting and supine position were symmetrical in appearance, no palpable masses or tenderness,  no skin retraction, no nipple inversion, no nipple discharge, no skin discoloration, no axillary or supraclavicular lymphadenopathy Abdomen: no palpable masses or tenderness, no rebound or guarding Extremities: no edema or skin discoloration or tenderness  Pelvic: Vulva: Normal             Vagina: No gross lesions or discharge  Cervix: No gross lesions or discharge  Uterus  AV, normal size, shape and consistency, non-tender and mobile  Adnexa  Without masses or tenderness  Anus: Normal   Assessment/Plan:  86 y.o. female for annual exam   1. Well female exam with routine gynecological exam Normal gynecologic exam in menopause.  Normal Pap tests in 2018, no indication to repeat.  Breast exam normal.  Mammogram October 2020 was negative.  Health labs with family physician.  2. Postmenopausal Well on no hormone replacement  therapy.  No postmenopausal bleeding.  3. Lichen sclerosus et atrophicus of the vulva Well on clobetasol ointment.  We will continue the same.  Prescription sent to pharmacy.  4. Age-related osteoporosis without current pathological fracture Repeat bone density.  Vitamin D supplements, calcium intake of 1200 mg daily and regular weightbearing physical activity is recommended. - DG Bone Density; Future  Other orders - Cholecalciferol  (VITAMIN D3) 125 MCG (5000 UT) CAPS; Take 1 capsule by mouth daily. - clobetasol ointment (TEMOVATE) 0.05 %; APPLY TOPICALY TWICE WEEKLY LONG TERM.  Princess Bruins MD, 10:32 AM 09/05/2019

## 2019-09-06 ENCOUNTER — Telehealth: Payer: Self-pay | Admitting: *Deleted

## 2019-09-06 NOTE — Telephone Encounter (Signed)
-----   Message from Genia Del, MD sent at 09/05/2019 11:00 AM EST ----- Regarding: Osteoporosis due for BD Please inform patient that I added for her to schedule a Bone Density in the course of this year.  Not discussed at the visit.

## 2019-09-06 NOTE — Telephone Encounter (Signed)
Patient informed, transferred to appointment desk to schedule. 

## 2019-09-07 ENCOUNTER — Encounter: Payer: Self-pay | Admitting: Obstetrics & Gynecology

## 2019-09-07 NOTE — Patient Instructions (Signed)
1. Well female exam with routine gynecological exam Normal gynecologic exam in menopause.  Normal Pap tests in 2018, no indication to repeat.  Breast exam normal.  Mammogram October 2020 was negative.  Health labs with family physician.  2. Postmenopausal Well on no hormone replacement therapy.  No postmenopausal bleeding.  3. Lichen sclerosus et atrophicus of the vulva Well on clobetasol ointment.  We will continue the same.  Prescription sent to pharmacy.  4. Age-related osteoporosis without current pathological fracture Repeat bone density.  Vitamin D supplements, calcium intake of 1200 mg daily and regular weightbearing physical activity is recommended. - DG Bone Density; Future  Other orders - Cholecalciferol (VITAMIN D3) 125 MCG (5000 UT) CAPS; Take 1 capsule by mouth daily. - clobetasol ointment (TEMOVATE) 0.05 %; APPLY TOPICALY TWICE WEEKLY LONG TERM.  Sarah Valenzuela, it was a pleasure seeing you today!

## 2019-10-22 ENCOUNTER — Encounter: Payer: Self-pay | Admitting: Family Medicine

## 2019-10-22 ENCOUNTER — Other Ambulatory Visit: Payer: Self-pay

## 2019-10-22 ENCOUNTER — Ambulatory Visit (INDEPENDENT_AMBULATORY_CARE_PROVIDER_SITE_OTHER): Payer: Medicare PPO | Admitting: Family Medicine

## 2019-10-22 VITALS — BP 138/68 | HR 78 | Temp 97.9°F | Ht <= 58 in | Wt 115.8 lb

## 2019-10-22 DIAGNOSIS — E785 Hyperlipidemia, unspecified: Secondary | ICD-10-CM | POA: Diagnosis not present

## 2019-10-22 DIAGNOSIS — I10 Essential (primary) hypertension: Secondary | ICD-10-CM

## 2019-10-22 DIAGNOSIS — J4520 Mild intermittent asthma, uncomplicated: Secondary | ICD-10-CM | POA: Diagnosis not present

## 2019-10-22 DIAGNOSIS — J302 Other seasonal allergic rhinitis: Secondary | ICD-10-CM | POA: Diagnosis not present

## 2019-10-22 LAB — POCT URINALYSIS DIPSTICK
Bilirubin, UA: NEGATIVE
Blood, UA: NEGATIVE
Glucose, UA: NEGATIVE
Ketones, UA: NEGATIVE
Leukocytes, UA: NEGATIVE
Nitrite, UA: NEGATIVE
Protein, UA: NEGATIVE
Spec Grav, UA: 1.02 (ref 1.010–1.025)
Urobilinogen, UA: 0.2 E.U./dL
pH, UA: 7.5 (ref 5.0–8.0)

## 2019-10-22 NOTE — Patient Instructions (Signed)

## 2019-10-22 NOTE — Progress Notes (Signed)
e Patient [facility]     Subjective:  Patient ID: Sarah Valenzuela, female    DOB: 05/28/1932  Age: 86 y.o. MRN: [id number]  CC:  Chief Complaint  Patient presents with  . Follow-up     3 mth follow up HTN    HPI Sarah Valenzuela is a very pleasant 86 year old female with a medical history significant for essential hypertension, hyperlipidemia, mild intermittent asthma, and history of osteoporosis presents for follow-up of chronic conditions.  Patient says that she has been feeling well and is without complaint.  Patient continues to isolate at home due to COVID-19 pandemic.  She says that she has not been able to remain active being that she cannot go to the local [employer] for exercise.  She has been following a low-fat diet and watching salt intake.  She says that she drinks Ensure between meals and her appetite has improved over the past several months.  She currently denies headache, chest pain, shortness of breath, abdominal pain, nausea, vomiting, or diarrhea.  She has not had sick contacts, recent travel, or exposure to COVID-19.  Patient states that she has completed 2 doses of COVID-19 vaccination within the past several weeks.  Patient states that she had no side effects.  Past Medical History:  Diagnosis Date  . Asthma   . Hypercholesteremia   . Hyperlipidemia   . Hypertension   . Osteoporosis 02/2017   T score -3.1  . Situational stress     History reviewed. No pertinent surgical history.  Family History  Problem Relation Age of Onset  . Cancer Brother        Prostate  . Breast cancer Sister        UNSURE  . Diabetes Sister   . Heart disease Sister     Social History   Socioeconomic History  . Marital status: Widowed    Spouse name: Not on file  . Number of children: Not on file  . Years of education: Not on file  . Highest education level: Not on file  Occupational History  . Not on file  Tobacco Use  .  Smoking status: Never Smoker  . Smokeless tobacco: Never Used  Substance and Sexual Activity  . Alcohol use: No  . Drug use: No  . Sexual activity: Not Currently    Comment: declined insurance questions  Other Topics Concern  . Not on file  Social History Narrative  . Not on file   Social Determinants of Health   Financial Resource Strain:   . Difficulty of Paying Living Expenses:   Food Insecurity:   . Worried About Charity fundraiser in the Last Year:   . Arboriculturist in the Last Year:   Transportation Needs:   . Film/video editor (Medical):   Marland Kitchen Lack of Transportation (Non-Medical):   Physical Activity:   . Days of Exercise per Week:   . Minutes of Exercise per Session:   Stress:   . Feeling of Stress :   Social Connections:   . Frequency of Communication with Friends and Family:   . Frequency of Social Gatherings with Friends and Family:   . Attends Religious Services:   . Active Member of Clubs or Organizations:   . Attends Archivist Meetings:   Marland Kitchen Marital Status:   Intimate Partner Violence:   . Fear of Current or Ex-Partner:   . Emotionally Abused:   Marland Kitchen Physically Abused:   .  Sexually Abused:     Outpatient Medications Prior to Visit  Medication Sig Dispense Refill  . acetaminophen (TYLENOL) 500 MG tablet Take 1 tablet (500 mg total) by mouth every 6 (six) hours as needed for pain. 30 tablet 0  . aspirin 81 MG chewable tablet Chew 81 mg by mouth daily.    . calcium citrate (CALCITRATE - DOSED IN MG ELEMENTAL CALCIUM) 950 MG tablet Take 1 tablet by mouth 2 (two) times daily.    . cetirizine (ZYRTEC) 10 MG tablet Take 10 mg by mouth daily.    . Cholecalciferol (VITAMIN D3) 125 MCG (5000 UT) CAPS Take 1 capsule by mouth daily.    . clobetasol ointment (TEMOVATE) 0.05 % APPLY TOPICALY TWICE WEEKLY LONG TERM. 30 g 4  . cycloSPORINE (RESTASIS) 0.05 % ophthalmic emulsion Place 1 drop into both eyes 2 (two) times daily.    Marland Kitchen docusate sodium (COLACE)  100 MG capsule Take 1 capsule (100 mg total) by mouth daily. 30 capsule 2  . feeding supplement, ENSURE COMPLETE, (ENSURE COMPLETE) LIQD Take 237 mLs by mouth 2 (two) times daily between meals. Two bottles daily 94174 mL 12  . fluticasone (FLONASE) 50 MCG/ACT nasal spray Place 2 sprays into both nostrils daily. 16 g 5  . Fluticasone-Salmeterol (ADVAIR) 100-50 MCG/DOSE AEPB Inhale 1 puff into the lungs 2 (two) times daily. 180 each 3  . ipratropium (ATROVENT HFA) 17 MCG/ACT inhaler USE 2 PUFFS 3 TIMES DAILY AS NEEDED FOR SHORTNESS OF BREATH. 25.8 g 5  . rosuvastatin (CRESTOR) 5 MG tablet Take 5 mg by mouth daily.    . SENNOSIDES PO Take 8.5 mg by mouth as needed (Swiss Kriss OTC Laxative).    . triamterene-hydrochlorothiazide (MAXZIDE-25) 37.5-25 MG tablet Take 0.5 tablets by mouth daily. 45 tablet 3  . Vaginal Moisturizer (VAGISIL FEMININE MOISTURIZER) LOTN Place vaginally 3 (three) times daily.    . zafirlukast (ACCOLATE) 20 MG tablet Take 1 tablet (20 mg total) by mouth 2 (two) times daily. 180 tablet 3   No facility-administered medications prior to visit.    Allergies  Allergen Reactions  . Penicillins Other (See Comments)    Breaks out. Longtime allergy    ROS Review of Systems  Constitutional: Negative.   HENT: Negative.   Respiratory: Negative.   Cardiovascular: Negative.   Endocrine: Negative.   Genitourinary: Negative.   Musculoskeletal: Negative.   Skin: Negative.   Neurological: Negative.   Psychiatric/Behavioral: Negative.       Objective:    Physical Exam  Constitutional: She appears well-developed and well-nourished.  HENT:  Head: Normocephalic and atraumatic.  Eyes: Pupils are equal, round, and reactive to light.  Cardiovascular: Normal rate and regular rhythm.  Pulmonary/Chest: Effort normal and breath sounds normal.  Abdominal: Soft. Bowel sounds are normal.  Musculoskeletal:        General: Normal range of motion.     Cervical back: Normal range of  motion and neck supple.  Neurological: She is alert.  Skin: Skin is warm and dry.  Psychiatric: She has a normal mood and affect. Her behavior is normal. Judgment and thought content normal.    BP 138/68   Pulse 78   Temp 97.9 F (36.6 C)   Ht 4\' 9"  (1.448 m)   Wt 115 lb 12.8 oz (52.5 kg)   SpO2 100%   BMI 25.06 kg/m  Wt Readings from Last 3 Encounters:  10/22/19 115 lb 12.8 oz (52.5 kg)  09/05/19 115 lb (52.2 kg)  07/23/19 114  lb (51.7 kg)     There are no preventive care reminders to display for this patient.  There are no preventive care reminders to display for this patient.  Lab Results  Component Value Date   TSH 1.75 10/17/2012   Lab Results  Component Value Date   WBC 7.0 02/07/2019   HGB 12.1 02/07/2019   HCT 36.7 02/07/2019   MCV 94 02/07/2019   PLT 218 02/07/2019   Lab Results  Component Value Date   NA 135 07/23/2019   K 3.7 07/23/2019   CO2 23 07/23/2019   GLUCOSE 90 07/23/2019   BUN 7 (L) 07/23/2019   CREATININE 0.65 07/23/2019   BILITOT 0.4 02/07/2019   ALKPHOS 51 02/07/2019   AST 23 02/07/2019   ALT 14 02/07/2019   PROT 7.0 02/07/2019   ALBUMIN 4.5 02/07/2019   CALCIUM 9.3 07/23/2019   Lab Results  Component Value Date   CHOL 176 02/07/2019   Lab Results  Component Value Date   HDL 98 02/07/2019   Lab Results  Component Value Date   LDLCALC 67 02/07/2019   Lab Results  Component Value Date   TRIG 56 02/07/2019   Lab Results  Component Value Date   CHOLHDL 1.8 02/07/2019   Lab Results  Component Value Date   HGBA1C 5.1 04/22/2016      Assessment & Plan:   Problem List Items Addressed This Visit      Cardiovascular and Mediastinum   HTN (hypertension) - Primary   Relevant Orders   Basic Metabolic Panel (Completed)   POCT urinalysis dipstick (Completed)     Respiratory   Asthma     Other   Seasonal allergies   Hyperlipidemia (Chronic)       Essential hypertension Blood pressure is well controlled on  current medication regimen.  Continue medication, monitor blood pressure at home. Continue DASH diet. Reminder to go to the ER if any CP, SOB, nausea, dizziness, severe HA, changes vision/speech, left arm numbness and tingling and jaw pain.  - Basic Metabolic Panel - POCT urinalysis dipstick  Hyperlipidemia, unspecified hyperlipidemia type Patient continues to follow-up with cardiology every 6 months.  Mild intermittent asthma without complication Asthma is well controlled, she has not had any exacerbations over the past several months.  Seasonal allergies Patient advised to continue antihistamine and avoid outdoor activity when pollen index is high.   Follow-up: Return in about 3 months (around 01/21/2020).    Nolon Nations  APRN, MSN, FNP-C Patient Care [facility] [address] [place], [place] [address] [phone]

## 2019-10-23 LAB — BASIC METABOLIC PANEL
BUN/Creatinine Ratio: 12 (ref 12–28)
BUN: 9 mg/dL (ref 8–27)
CO2: 24 mmol/L (ref 20–29)
Calcium: 9.3 mg/dL (ref 8.7–10.3)
Chloride: 97 mmol/L (ref 96–106)
Creatinine, Ser: 0.73 mg/dL (ref 0.57–1.00)
GFR calc Af Amer: 87 mL/min/{1.73_m2} (ref >59)
GFR calc non Af Amer: 75 mL/min/{1.73_m2} (ref >59)
Glucose: 88 mg/dL (ref 65–99)
Potassium: 3.9 mmol/L (ref 3.5–5.2)
Sodium: 135 mmol/L (ref 134–144)

## 2019-10-24 ENCOUNTER — Telehealth: Payer: Self-pay

## 2019-10-24 NOTE — Telephone Encounter (Signed)
-----   Message from Massie Maroon, [place] sent at 10/23/2019  3:46 PM EDT ----- Regarding: Lab results Please inform patient that her labs are all within a normal range. Recommend that she continue to follow a balanced diet, ensure between meals, and 2-3 bottles of water per day if tolerated. Follow up in clinic as scheduled.   Nolon Nations  APRN, MSN, FNP-C Patient Care [facility] [address] [place], [place] [address] [phone]

## 2019-10-24 NOTE — Telephone Encounter (Signed)
Called informed patient of her labs results, reminder patient of her up coming appt, with [place] in July.

## 2019-11-07 ENCOUNTER — Other Ambulatory Visit: Payer: Self-pay | Admitting: Family Medicine

## 2019-11-07 DIAGNOSIS — J302 Other seasonal allergic rhinitis: Secondary | ICD-10-CM

## 2019-11-11 ENCOUNTER — Other Ambulatory Visit: Payer: Self-pay | Admitting: Family Medicine

## 2019-11-11 ENCOUNTER — Telehealth: Payer: Self-pay | Admitting: Internal Medicine

## 2019-11-11 DIAGNOSIS — J302 Other seasonal allergic rhinitis: Secondary | ICD-10-CM

## 2019-11-11 DIAGNOSIS — I10 Essential (primary) hypertension: Secondary | ICD-10-CM

## 2019-11-11 DIAGNOSIS — J4520 Mild intermittent asthma, uncomplicated: Secondary | ICD-10-CM

## 2019-11-11 MED ORDER — FLUTICASONE PROPIONATE 50 MCG/ACT NA SUSP: 2.0000 | Freq: Every day | NASAL | 5 refills | Status: DC

## 2019-11-11 MED ORDER — ZAFIRLUKAST 20 MG PO TABS: 20.0000 mg | ORAL_TABLET | Freq: Two times a day (BID) | ORAL | 5 refills | Status: DC

## 2019-11-11 MED ORDER — TRIAMTERENE-HCTZ 37.5-25 MG PO TABS: 0.5000 | ORAL_TABLET | Freq: Every day | ORAL | 5 refills | Status: DC

## 2019-11-11 NOTE — Progress Notes (Signed)
Meds ordered this encounter  Medications  . fluticasone (FLONASE) 50 MCG/ACT nasal spray    Sig: Place 2 sprays into both nostrils daily.    Dispense:  16 g    Refill:  5    Order Specific Question:   Supervising Provider    Answer:   Quentin Angst [id number]  . triamterene-hydrochlorothiazide (MAXZIDE-25) 37.5-25 MG tablet    Sig: Take 0.5 tablets by mouth daily.    Dispense:  45 tablet    Refill:  5    Order Specific Question:   Supervising Provider    Answer:   Quentin Angst [id number]  . zafirlukast (ACCOLATE) 20 MG tablet    Sig: Take 1 tablet (20 mg total) by mouth 2 (two) times daily.    Dispense:  180 tablet    Refill:  5    Order Specific Question:   Supervising Provider    Answer:   Quentin Angst [[id number]]    Nolon Nations  APRN, MSN, FNP-C Patient Care [facility] [address] [place], [place] [address] [phone]

## 2019-11-11 NOTE — Telephone Encounter (Signed)
Pt needs a refill on fluticasone spray and powder. In addition, she needs a refill on zafirlukast and triamterene.

## 2019-11-13 ENCOUNTER — Other Ambulatory Visit: Payer: Self-pay | Admitting: Family Medicine

## 2019-11-13 DIAGNOSIS — J4520 Mild intermittent asthma, uncomplicated: Secondary | ICD-10-CM

## 2019-11-13 NOTE — Telephone Encounter (Signed)
Pt hasn't had this in 1 year is okay to refill

## 2019-12-04 ENCOUNTER — Other Ambulatory Visit: Payer: Self-pay | Admitting: Family Medicine

## 2019-12-04 DIAGNOSIS — J4520 Mild intermittent asthma, uncomplicated: Secondary | ICD-10-CM

## 2019-12-06 ENCOUNTER — Other Ambulatory Visit: Payer: Self-pay | Admitting: Family Medicine

## 2019-12-06 DIAGNOSIS — J4520 Mild intermittent asthma, uncomplicated: Secondary | ICD-10-CM

## 2019-12-25 ENCOUNTER — Other Ambulatory Visit: Payer: Self-pay | Admitting: Family Medicine

## 2019-12-25 DIAGNOSIS — J4520 Mild intermittent asthma, uncomplicated: Secondary | ICD-10-CM

## 2020-01-06 ENCOUNTER — Other Ambulatory Visit: Payer: Self-pay | Admitting: Family Medicine

## 2020-01-06 DIAGNOSIS — J4520 Mild intermittent asthma, uncomplicated: Secondary | ICD-10-CM

## 2020-01-21 ENCOUNTER — Other Ambulatory Visit: Payer: Self-pay

## 2020-01-21 ENCOUNTER — Ambulatory Visit (INDEPENDENT_AMBULATORY_CARE_PROVIDER_SITE_OTHER): Payer: Medicare PPO | Admitting: Family Medicine

## 2020-01-21 ENCOUNTER — Encounter: Payer: Self-pay | Admitting: Family Medicine

## 2020-01-21 VITALS — BP 141/74 | HR 82 | Temp 98.8°F | Resp 14 | Ht <= 58 in | Wt 114.0 lb

## 2020-01-21 DIAGNOSIS — J4520 Mild intermittent asthma, uncomplicated: Secondary | ICD-10-CM | POA: Diagnosis not present

## 2020-01-21 DIAGNOSIS — E785 Hyperlipidemia, unspecified: Secondary | ICD-10-CM | POA: Diagnosis not present

## 2020-01-21 DIAGNOSIS — I10 Essential (primary) hypertension: Secondary | ICD-10-CM | POA: Diagnosis not present

## 2020-01-21 DIAGNOSIS — G4700 Insomnia, unspecified: Secondary | ICD-10-CM | POA: Diagnosis not present

## 2020-01-21 LAB — POCT URINALYSIS DIPSTICK
Bilirubin, UA: NEGATIVE
Blood, UA: NEGATIVE
Glucose, UA: NEGATIVE
Ketones, UA: NEGATIVE
Leukocytes, UA: NEGATIVE
Nitrite, UA: NEGATIVE
Protein, UA: NEGATIVE
Spec Grav, UA: 1.015 (ref 1.010–1.025)
Urobilinogen, UA: 0.2 E.U./dL
pH, UA: 8.5 — AB (ref 5.0–8.0)

## 2020-01-21 NOTE — Patient Instructions (Signed)
Practice good sleep hygiene.  Stick to a sleep schedule, even on weekends. Exercise is great, but not too late in the day Avoid alcoholic drinks before bed Avoid large meals and beverages late before bed Don't take naps after 3 pm. Keep power naps less than 1 hour.  Relax before bed.  Take a hot bath before bed.  Have a good sleeping environment. Get rid of anything in your bedroom that might distract you from sleep.  Adopt good sleeping posture.   Melatonin oral solid dosage forms What is this medicine? MELATONIN (mel uh TOH nin) is a dietary supplement. It is mostly promoted to help maintain normal sleep patterns. The FDA has not approved this supplement for any medical use. This supplement may be used for other purposes; ask your health care provider or pharmacist if you have questions. This medicine may be used for other purposes; ask your health care provider or pharmacist if you have questions. COMMON BRAND NAME(S): Melatonex What should I tell my health care provider before I take this medicine? They need to know if you have any of these conditions:  cancer  depression or mental illness  diabetes  hormone problems  if you often drink alcohol  immune system problems  liver disease  lung or breathing disease, like asthma  organ transplant  seizure disorder  an unusual or allergic reaction to melatonin, other medicines, foods, dyes, or preservatives  pregnant or trying to get pregnant  breast-feeding How should I use this medicine? Take this supplement by mouth with a glass of water. Do not take with food. This supplement is usually taken 1 or 2 hours before bedtime. After taking this supplement, limit your activities to those needed to prepare for bed. Some products may be chewed or dissolved in the mouth before swallowing. Some tablets or capsules must be swallowed whole; do not cut, crush or chew. Follow the directions on the package labeling, or take as directed  by your health care professional. Do not take this supplement more often than directed. Talk to your pediatrician regarding the use of this supplement in children. Special care may be needed. This supplement is not recommended for use in children without a prescription. Overdosage: If you think you have taken too much of this medicine contact a poison control center or emergency room at once. NOTE: This medicine is only for you. Do not share this medicine with others. What if I miss a dose? If you miss taking your dose at the usual time, skip that dose. If it is almost time for your next dose, take only that dose. Do not take double or extra doses. What may interact with this medicine? Do not take this medicine with any of the following medications:  fluvoxamine  ramelteon  tasimelteon This medicine may also interact with the following medications:  alcohol  caffeine  carbamazepine  certain antibiotics like ciprofloxacin  certain medicines for depression, anxiety, or psychotic disturbances  cimetidine  female hormones, like estrogens and birth control pills, patches, rings, or injections  methoxsalen  nifedipine  other medications for sleep  other herbal or dietary supplements  phenobarbital  rifampin  smoking tobacco  tamoxifen  warfarin This list may not describe all possible interactions. Give your health care provider a list of all the medicines, herbs, non-prescription drugs, or dietary supplements you use. Also tell them if you smoke, drink alcohol, or use illegal drugs. Some items may interact with your medicine. What should I watch for while using  this medicine? See your doctor if your symptoms do not get better or if they get worse. Do not take this supplement for more than 2 weeks unless your doctor tells you to. You may get drowsy or dizzy. Do not drive, use machinery, or do anything that needs mental alertness until you know how this medicine affects you.  Do not stand or sit up quickly, especially if you are an older patient. This reduces the risk of dizzy or fainting spells. Alcohol may interfere with the effect of this medicine. Avoid alcoholic drinks. After taking this medicine, you may get up out of bed and do an activity that you do not know you are doing. The next morning, you may have no memory of this. Activities include driving a car ("sleep-driving"), making and eating food, talking on the phone, sexual activity, and sleep-walking. Serious injuries have occurred. Call your doctor right away if you find out you have done any of these activities. Do not take this medicine if you have used alcohol that evening. Do not take it if you have taken another medicine for sleep. The risk of doing these sleep-related activities is higher. Talk to your doctor before you use this supplement if you are currently being treated for an emotional, mental, or sleep problem. This medicine may interfere with your treatment. Herbal or dietary supplements are not regulated like medicines. Rigid quality control standards are not required for dietary supplements. The purity and strength of these products can vary. The safety and effect of this dietary supplement for a certain disease or illness is not well known. This product is not intended to diagnose, treat, cure or prevent any disease. The Food and Drug Administration suggests the following to help consumers protect themselves:  Always read product labels and follow directions.  Natural does not mean a product is safe for humans to take.  Look for products that include USP after the ingredient name. This means that the manufacturer followed the standards of the [place] Pharmacopoeia.  Supplements made or sold by a nationally known food or drug company are more likely to be made under tight controls. You can write to the company for more information about how the product was made. What side effects may I notice from  receiving this medicine? Side effects that you should report to your doctor or health care professional as soon as possible:  allergic reactions like skin rash, itching or hives, swelling of the face, lips, or tongue  breathing problems  confusion  depressed mood, irritable, or other changes in moods or behaviors  feeling faint or lightheaded, falls  increased blood pressure  irregular or missed menstrual periods  signs and symptoms of liver injury like dark yellow or brown urine; general ill feeling or flu-like symptoms; light-colored stools; loss of appetite; nausea; right upper belly pain; unusually weak or tired; yellowing of the eyes or skin  trouble staying awake or alert during the day  unusual activities while you are still asleep like driving, eating, making phone calls  unusual bleeding or bruising Side effects that usually do not require medical attention (report to your doctor or health care professional if they continue or are bothersome):  dizziness  drowsiness  headache  hot flashes  nausea  tiredness  unusual dreams or nightmares  upset stomach This list may not describe all possible side effects. Call your doctor for medical advice about side effects. You may report side effects to FDA at 1-800-FDA-1088. Where should I keep my  medicine? Keep out of the reach of children. Store at room temperature or as directed on the package label. Protect from moisture. Throw away any unused supplement after the expiration date. NOTE: This sheet is a summary. It may not cover all possible information. If you have questions about this medicine, talk to your doctor, pharmacist, or health care provider.  2020 Elsevier/Gold Standard (2017-12-08 12:11:58)

## 2020-01-21 NOTE — Progress Notes (Signed)
[facility]   Established Patient Office Visit  Subjective:  Patient ID: Sarah Valenzuela, female    DOB: 19-Apr-1932  Age: 86 y.o. MRN: [id number]  CC:  Chief Complaint  Patient presents with  . Hypertension  . Insomnia    HPI Sarah Valenzuela is a very pleasant 86 year old female with a medical history significant for essential hypertension, hyperlipidemia, mild intermittent asthma, and history of osteoporosis that presents for 58-month follow-up of chronic conditions.  Patient states that she has been feeling well but has a complaint of periodic insomnia.  Patient states that she has difficulty falling asleep and staying asleep.  She says that when she gets up during the night to urinate, she is rarely able to go back to sleep.  Patient has not attempted any over-the-counter interventions to assist with this problem. Patient has been following a low fat, low sodium diet over small meals throughout the day.Patient continues to supplement with Ensure between meals. She states her appetite has improved. Patient currently denies chest pain, shortness of breath, abdominal pain, urinary symptoms, nausea, vomiting, or diarrhea. Patient has had no sick contacts, recent travel, or exposure to COVID-19. Patient has completed COVID-19 vaccination.  Past Medical History:  Diagnosis Date  . Asthma   . Hypercholesteremia   . Hyperlipidemia   . Hypertension   . Osteoporosis 02/2017   T score -3.1  . Situational stress     History reviewed. No pertinent surgical history.  Family History  Problem Relation Age of Onset  . Cancer Brother        Prostate  . Breast cancer Sister        UNSURE  . Diabetes Sister   . Heart disease Sister     Social History   Socioeconomic History  . Marital status: Widowed    Spouse name: Not on file  . Number of children: Not on file  . Years of education: Not on file  . Highest education level:  Not on file  Occupational History  . Not on file  Tobacco Use  . Smoking status: Never Smoker  . Smokeless tobacco: Never Used  Vaping Use  . Vaping Use: Never used  Substance and Sexual Activity  . Alcohol use: No  . Drug use: No  . Sexual activity: Not Currently    Comment: declined insurance questions  Other Topics Concern  . Not on file  Social History Narrative  . Not on file   Social Determinants of Health   Financial Resource Strain:   . Difficulty of Paying Living Expenses:   Food Insecurity:   . Worried About Programme researcher, broadcasting/film/video in the Last Year:   . Barista in the Last Year:   Transportation Needs:   . Freight forwarder (Medical):   Marland Kitchen Lack of Transportation (Non-Medical):   Physical Activity:   . Days of Exercise per Week:   . Minutes of Exercise per Session:   Stress:   . Feeling of Stress :   Social Connections:   . Frequency of Communication with Friends and Family:   . Frequency of Social Gatherings with Friends and Family:   . Attends Religious Services:   . Active Member of Clubs or Organizations:   . Attends Banker Meetings:   Marland Kitchen Marital Status:   Intimate Partner Violence:   . Fear of Current or Ex-Partner:   . Emotionally Abused:   Marland Kitchen Physically Abused:   .  Sexually Abused:     Outpatient Medications Prior to Visit  Medication Sig Dispense Refill  . acetaminophen (TYLENOL) 500 MG tablet Take 1 tablet (500 mg total) by mouth every 6 (six) hours as needed for pain. 30 tablet 0  . aspirin 81 MG chewable tablet Chew 81 mg by mouth daily.    . calcium citrate (CALCITRATE - DOSED IN MG ELEMENTAL CALCIUM) 950 MG tablet Take 1 tablet by mouth 2 (two) times daily.    . cetirizine (ZYRTEC) 10 MG tablet Take 10 mg by mouth daily.    . Cholecalciferol (VITAMIN D3) 125 MCG (5000 UT) CAPS Take 1 capsule by mouth daily.    . clobetasol ointment (TEMOVATE) 0.05 % APPLY TOPICALY TWICE WEEKLY LONG TERM. 30 g 4  . cycloSPORINE (RESTASIS)  0.05 % ophthalmic emulsion Place 1 drop into both eyes 2 (two) times daily.    Marland Kitchen docusate sodium (COLACE) 100 MG capsule Take 1 capsule (100 mg total) by mouth daily. 30 capsule 2  . feeding supplement, ENSURE COMPLETE, (ENSURE COMPLETE) LIQD Take 237 mLs by mouth 2 (two) times daily between meals. Two bottles daily 70017 mL 12  . fluticasone (FLONASE) 50 MCG/ACT nasal spray Place 2 sprays into both nostrils daily. 16 g 5  . Fluticasone-Salmeterol (ADVAIR) 100-50 MCG/DOSE AEPB INHALE 1 PUFF TWICE DAILY 60 each 0  . ipratropium (ATROVENT HFA) 17 MCG/ACT inhaler USE 2 PUFFS 3 TIMES DAILY AS NEEDED FOR SHORTNESS OF BREATH. 25.8 g 5  . rosuvastatin (CRESTOR) 5 MG tablet Take 5 mg by mouth daily.    . SENNOSIDES PO Take 8.5 mg by mouth as needed (Swiss Kriss OTC Laxative).    . triamterene-hydrochlorothiazide (MAXZIDE-25) 37.5-25 MG tablet Take 0.5 tablets by mouth daily. 45 tablet 5  . Vaginal Moisturizer (VAGISIL FEMININE MOISTURIZER) LOTN Place vaginally 3 (three) times daily.    . zafirlukast (ACCOLATE) 20 MG tablet Take 1 tablet (20 mg total) by mouth 2 (two) times daily. 180 tablet 5   No facility-administered medications prior to visit.    Allergies  Allergen Reactions  . Penicillins Other (See Comments)    Breaks out. Longtime allergy    ROS Review of Systems  Constitutional: Negative for activity change and appetite change.  HENT: Negative.   Eyes: Negative.   Respiratory: Negative.   Cardiovascular: Negative.   Gastrointestinal: Negative.   Endocrine: Negative for polydipsia, polyphagia and polyuria.  Genitourinary: Negative.   Musculoskeletal: Negative for arthralgias and back pain.  Skin: Negative.   Psychiatric/Behavioral: Negative.       Objective:    Physical Exam Eyes:     Pupils: Pupils are equal, round, and reactive to light.  Cardiovascular:     Rate and Rhythm: Normal rate and regular rhythm.     Pulses: Normal pulses.  Pulmonary:     Effort: Pulmonary  effort is normal.  Abdominal:     General: Abdomen is flat. Bowel sounds are normal.  Skin:    General: Skin is warm.  Neurological:     General: No focal deficit present.     Mental Status: Mental status is at baseline.  Psychiatric:        Mood and Affect: Mood normal.        Behavior: Behavior normal.        Thought Content: Thought content normal.        Judgment: Judgment normal.     BP (!) 143/59 (BP Location: Left Arm, Patient Position: Sitting, Cuff Size: Normal)  Pulse 82   Temp 98.8 F (37.1 C) (Oral)   Resp 14   Ht 4\' 9"  (1.448 m)   Wt 114 lb (51.7 kg)   SpO2 99%   BMI 24.67 kg/m  Wt Readings from Last 3 Encounters:  01/21/20 114 lb (51.7 kg)  10/22/19 115 lb 12.8 oz (52.5 kg)  09/05/19 115 lb (52.2 kg)     There are no preventive care reminders to display for this patient.  There are no preventive care reminders to display for this patient.  Lab Results  Component Value Date   TSH 1.75 10/17/2012   Lab Results  Component Value Date   WBC 7.0 02/07/2019   HGB 12.1 02/07/2019   HCT 36.7 02/07/2019   MCV 94 02/07/2019   PLT 218 02/07/2019   Lab Results  Component Value Date   NA 135 10/22/2019   K 3.9 10/22/2019   CO2 24 10/22/2019   GLUCOSE 88 10/22/2019   BUN 9 10/22/2019   CREATININE 0.73 10/22/2019   BILITOT 0.4 02/07/2019   ALKPHOS 51 02/07/2019   AST 23 02/07/2019   ALT 14 02/07/2019   PROT 7.0 02/07/2019   ALBUMIN 4.5 02/07/2019   CALCIUM 9.3 10/22/2019   Lab Results  Component Value Date   CHOL 176 02/07/2019   Lab Results  Component Value Date   HDL 98 02/07/2019   Lab Results  Component Value Date   LDLCALC 67 02/07/2019   Lab Results  Component Value Date   TRIG 56 02/07/2019   Lab Results  Component Value Date   CHOLHDL 1.8 02/07/2019   Lab Results  Component Value Date   HGBA1C 5.1 04/22/2016      Assessment & Plan:   Problem List Items Addressed This Visit      Cardiovascular and Mediastinum   HTN  (hypertension) - Primary   Relevant Orders   Basic Metabolic Panel   Urinalysis Dipstick (Completed)     Respiratory   Asthma     Other   Hyperlipidemia (Chronic)   Relevant Orders   Basic Metabolic Panel      Essential hypertension BP (!) 141/74   Pulse 82   Temp 98.8 F (37.1 C) (Oral)   Resp 14   Ht 4\' 9"  (1.448 m)   Wt 114 lb (51.7 kg)   SpO2 99%   BMI 24.67 kg/m  - Continue medication, monitor blood pressure at home. Continue DASH diet. Reminder to go to the ER if any CP, SOB, nausea, dizziness, severe HA, changes vision/speech, left arm numbness and tingling and jaw pain.    - Basic Metabolic Panel - Urinalysis Dipstick  Hyperlipidemia, unspecified hyperlipidemia type Recommend that patient follows a lowfat, low carbohydrate diet divided over small meals throughout the day.  - Basic Metabolic Panel - Hepatic Function Panel - Lipid Panel  Mild intermittent asthma without complication Well controlled, no changes warranted on today.   Follow-up: Return in about 3 months (around 04/22/2020).    APRN, MSN, FNP-C Patient Care [facility] [address] [place], [address] [place] [phone]

## 2020-01-22 LAB — HEPATIC FUNCTION PANEL
ALT: 12 IU/L (ref 0–32)
AST: 22 IU/L (ref 0–40)
Albumin: 4.3 g/dL (ref 3.6–4.6)
Alkaline Phosphatase: 58 IU/L (ref 48–121)
Bilirubin Total: 0.3 mg/dL (ref 0.0–1.2)
Bilirubin, Direct: 0.1 mg/dL (ref 0.00–0.40)
Total Protein: 7.1 g/dL (ref 6.0–8.5)

## 2020-01-22 LAB — LIPID PANEL
Chol/HDL Ratio: 1.9 ratio (ref 0.0–4.4)
Cholesterol, Total: 181 mg/dL (ref 100–199)
HDL: 93 mg/dL (ref >39)
LDL Chol Calc (NIH): 78 mg/dL (ref 0–99)
Triglycerides: 47 mg/dL (ref 0–149)
VLDL Cholesterol Cal: 10 mg/dL (ref 5–40)

## 2020-01-22 LAB — BASIC METABOLIC PANEL
BUN/Creatinine Ratio: 19 (ref 12–28)
BUN: 13 mg/dL (ref 8–27)
CO2: 25 mmol/L (ref 20–29)
Calcium: 9.5 mg/dL (ref 8.7–10.3)
Chloride: 97 mmol/L (ref 96–106)
Creatinine, Ser: 0.68 mg/dL (ref 0.57–1.00)
GFR calc Af Amer: 92 mL/min/{1.73_m2} (ref >59)
GFR calc non Af Amer: 79 mL/min/{1.73_m2} (ref >59)
Glucose: 84 mg/dL (ref 65–99)
Potassium: 3.8 mmol/L (ref 3.5–5.2)
Sodium: 135 mmol/L (ref 134–144)

## 2020-01-22 LAB — SPECIMEN STATUS REPORT

## 2020-01-23 ENCOUNTER — Telehealth: Payer: Self-pay

## 2020-01-23 NOTE — Telephone Encounter (Signed)
Called and spoke with patient advised that labs are within a normal range and to continue low sodium diet, drink ensure, and follow up as scheduled. Thanks!

## 2020-01-23 NOTE — Telephone Encounter (Signed)
-----   Message from Massie Maroon, [place] sent at 01/22/2020 12:31 PM EDT ----- Regarding: lab results Please inform patient that her laboratory values are all within a normal range. Continue to follow a low-fat low-sodium diet and use Ensure supplement between meals. Follow-up in office as scheduled.Nolon Nations  APRN, MSN, FNP-C Patient Care [facility] [address] [place], [place] [address] [phone]

## 2020-02-06 ENCOUNTER — Other Ambulatory Visit: Payer: Self-pay | Admitting: Family Medicine

## 2020-02-06 DIAGNOSIS — J4520 Mild intermittent asthma, uncomplicated: Secondary | ICD-10-CM

## 2020-03-04 ENCOUNTER — Other Ambulatory Visit: Payer: Self-pay | Admitting: Family Medicine

## 2020-03-04 DIAGNOSIS — J4520 Mild intermittent asthma, uncomplicated: Secondary | ICD-10-CM

## 2020-03-12 ENCOUNTER — Telehealth: Payer: Self-pay | Admitting: Family Medicine

## 2020-03-12 ENCOUNTER — Other Ambulatory Visit (HOSPITAL_COMMUNITY): Payer: Self-pay | Admitting: Family Medicine

## 2020-03-12 DIAGNOSIS — J4520 Mild intermittent asthma, uncomplicated: Secondary | ICD-10-CM

## 2020-03-12 MED ORDER — FLUTICASONE-SALMETEROL 100-50 MCG/DOSE IN AEPB: 1.0000 | INHALATION_SPRAY | Freq: Two times a day (BID) | RESPIRATORY_TRACT | 5 refills | Status: DC

## 2020-03-12 NOTE — Telephone Encounter (Signed)
[facility] called to request refill for Advair. Pharmacy: [phone]   Message left on VM 03/11/2020

## 2020-03-12 NOTE — Progress Notes (Signed)
Meds ordered this encounter  Medications  . Fluticasone-Salmeterol (ADVAIR) 100-50 MCG/DOSE AEPB    Sig: Inhale 1 puff into the lungs 2 (two) times daily.    Dispense:  60 each    Refill:  5    Order Specific Question:   Supervising Provider    Answer:   Quentin Angst [[id number]]    Nolon Nations  APRN, MSN, FNP-C Patient Care [facility] [address] [place], [place] [address] [phone]

## 2020-04-21 ENCOUNTER — Ambulatory Visit: Payer: Medicare PPO | Admitting: Family Medicine

## 2020-04-28 ENCOUNTER — Ambulatory Visit (INDEPENDENT_AMBULATORY_CARE_PROVIDER_SITE_OTHER): Payer: Medicare PPO | Admitting: Family Medicine

## 2020-04-28 ENCOUNTER — Encounter: Payer: Self-pay | Admitting: Family Medicine

## 2020-04-28 ENCOUNTER — Other Ambulatory Visit: Payer: Self-pay

## 2020-04-28 VITALS — BP 140/70 | HR 79 | Temp 97.5°F | Resp 14 | Ht <= 58 in | Wt 117.6 lb

## 2020-04-28 DIAGNOSIS — J4520 Mild intermittent asthma, uncomplicated: Secondary | ICD-10-CM | POA: Diagnosis not present

## 2020-04-28 DIAGNOSIS — J302 Other seasonal allergic rhinitis: Secondary | ICD-10-CM

## 2020-04-28 DIAGNOSIS — I10 Essential (primary) hypertension: Secondary | ICD-10-CM | POA: Diagnosis not present

## 2020-04-28 DIAGNOSIS — E782 Mixed hyperlipidemia: Secondary | ICD-10-CM

## 2020-04-28 MED ORDER — ZAFIRLUKAST 20 MG PO TABS: 20.0000 mg | ORAL_TABLET | Freq: Two times a day (BID) | ORAL | 5 refills | Status: DC

## 2020-04-28 MED ORDER — FLUTICASONE-SALMETEROL 100-50 MCG/DOSE IN AEPB: 1.0000 | INHALATION_SPRAY | Freq: Two times a day (BID) | RESPIRATORY_TRACT | 5 refills | Status: DC

## 2020-04-28 MED ORDER — ROSUVASTATIN CALCIUM 5 MG PO TABS: 5.0000 mg | ORAL_TABLET | Freq: Every day | ORAL | 1 refills | Status: DC

## 2020-04-28 MED ORDER — TRIAMTERENE-HCTZ 37.5-25 MG PO TABS: 0.5000 | ORAL_TABLET | Freq: Every day | ORAL | 5 refills | Status: DC

## 2020-04-28 MED ORDER — CETIRIZINE HCL 10 MG PO TABS: 10.0000 mg | ORAL_TABLET | Freq: Every day | ORAL | 1 refills | Status: AC

## 2020-04-28 MED ORDER — FLUTICASONE PROPIONATE 50 MCG/ACT NA SUSP: 2.0000 | Freq: Every day | NASAL | 5 refills | Status: DC

## 2020-04-28 NOTE — Progress Notes (Signed)
[facility]   Established Patient Office Visit  Subjective:  Patient ID: Sarah Valenzuela, female    DOB: [birth year]  Age: 86 y.o. MRN: [id number]  CC: No chief complaint on file. +  HPI Sarah Valenzuela is a very pleasant 86 year old female with a medical history significant for essential hypertension, hyperlipidemia, mild intermittent asthma, history of seasonal allergies, and history of osteoporosis presents for follow-up of chronic conditions.  Patient states that she has been feeling very well Has been more active in the community.  She says that she is up-to-date with vaccinations.  She received COVID-19 vaccination and received her booster on March 05, 2020. Patient says that she has been taking all medications without interruption.  She has been checking her blood pressure at home daily per cardiologist.  Her blood pressure on yesterday was 117/73.  Patient denies any lower extremity swelling, chest pain, urinary symptoms, nausea, vomiting, or diarrhea.  Past Medical History:  Diagnosis Date  . Asthma   . Hypercholesteremia   . Hyperlipidemia   . Hypertension   . Osteoporosis 02/2017   T score -3.1  . Situational stress     No past surgical history on file.  Family History  Problem Relation Age of Onset  . Cancer Brother        Prostate  . Breast cancer Sister        UNSURE  . Diabetes Sister   . Heart disease Sister     Social History   Socioeconomic History  . Marital status: Widowed    Spouse name: Not on file  . Number of children: Not on file  . Years of education: Not on file  . Highest education level: Not on file  Occupational History  . Not on file  Tobacco Use  . Smoking status: Never Smoker  . Smokeless tobacco: Never Used  Vaping Use  . Vaping Use: Never used  Substance and Sexual Activity  . Alcohol use: No  . Drug use: No  . Sexual activity: Not Currently    Comment: declined  insurance questions  Other Topics Concern  . Not on file  Social History Narrative  . Not on file   Social Determinants of Health   Financial Resource Strain:   . Difficulty of Paying Living Expenses: Not on file  Food Insecurity:   . Worried About Programme researcher, broadcasting/film/video in the Last Year: Not on file  . Ran Out of Food in the Last Year: Not on file  Transportation Needs:   . Lack of Transportation (Medical): Not on file  . Lack of Transportation (Non-Medical): Not on file  Physical Activity:   . Days of Exercise per Week: Not on file  . Minutes of Exercise per Session: Not on file  Stress:   . Feeling of Stress : Not on file  Social Connections:   . Frequency of Communication with Friends and Family: Not on file  . Frequency of Social Gatherings with Friends and Family: Not on file  . Attends Religious Services: Not on file  . Active Member of Clubs or Organizations: Not on file  . Attends Banker Meetings: Not on file  . Marital Status: Not on file  Intimate Partner Violence:   . Fear of Current or Ex-Partner: Not on file  . Emotionally Abused: Not on file  . Physically Abused: Not on file  . Sexually Abused: Not on file    Outpatient Medications  Prior to Visit  Medication Sig Dispense Refill  . acetaminophen (TYLENOL) 500 MG tablet Take 1 tablet (500 mg total) by mouth every 6 (six) hours as needed for pain. 30 tablet 0  . aspirin 81 MG chewable tablet Chew 81 mg by mouth daily.    . calcium citrate (CALCITRATE - DOSED IN MG ELEMENTAL CALCIUM) 950 MG tablet Take 1 tablet by mouth 2 (two) times daily.    . Cholecalciferol (VITAMIN D3) 125 MCG (5000 UT) CAPS Take 1 capsule by mouth daily.    . clobetasol ointment (TEMOVATE) 0.05 % APPLY TOPICALY TWICE WEEKLY LONG TERM. 30 g 4  . cycloSPORINE (RESTASIS) 0.05 % ophthalmic emulsion Place 1 drop into both eyes 2 (two) times daily.    Marland Kitchen. docusate sodium (COLACE) 100 MG capsule Take 1 capsule (100 mg total) by mouth  daily. 30 capsule 2  . feeding supplement, ENSURE COMPLETE, (ENSURE COMPLETE) LIQD Take 237 mLs by mouth 2 (two) times daily between meals. Two bottles daily [id number] mL 12  . ipratropium (ATROVENT HFA) 17 MCG/ACT inhaler USE 2 PUFFS 3 TIMES DAILY AS NEEDED FOR SHORTNESS OF BREATH. 25.8 g 5  . SENNOSIDES PO Take 8.5 mg by mouth as needed (Swiss Kriss OTC Laxative).    . Vaginal Moisturizer (VAGISIL FEMININE MOISTURIZER) LOTN Place vaginally 3 (three) times daily.    . cetirizine (ZYRTEC) 10 MG tablet Take 10 mg by mouth daily.    . fluticasone (FLONASE) 50 MCG/ACT nasal spray Place 2 sprays into both nostrils daily. 16 g 5  . Fluticasone-Salmeterol (ADVAIR) 100-50 MCG/DOSE AEPB Inhale 1 puff into the lungs 2 (two) times daily. 60 each 5  . rosuvastatin (CRESTOR) 5 MG tablet Take 5 mg by mouth daily.    Marland Kitchen. triamterene-hydrochlorothiazide (MAXZIDE-25) 37.5-25 MG tablet Take 0.5 tablets by mouth daily. 45 tablet 5  . zafirlukast (ACCOLATE) 20 MG tablet Take 1 tablet (20 mg total) by mouth 2 (two) times daily. 180 tablet 5   No facility-administered medications prior to visit.    Allergies  Allergen Reactions  . Penicillins Other (See Comments)    Breaks out. Longtime allergy    ROS Review of Systems  Constitutional: Negative.   HENT: Negative.   Eyes: Negative.   Respiratory: Negative.   Cardiovascular: Negative.   Gastrointestinal: Negative.   Endocrine: Negative.   Genitourinary: Negative.   Musculoskeletal: Negative.   Neurological: Negative.   Hematological: Negative.   Psychiatric/Behavioral: Negative.       Objective:    Physical Exam Constitutional:      Appearance: Normal appearance.  Eyes:     Pupils: Pupils are equal, round, and reactive to light.  Cardiovascular:     Rate and Rhythm: Normal rate and regular rhythm.     Heart sounds: Normal heart sounds.  Pulmonary:     Effort: Pulmonary effort is normal.     Breath sounds: Normal breath sounds.  Musculoskeletal:         General: Normal range of motion.  Skin:    General: Skin is warm.  Neurological:     General: No focal deficit present.     Mental Status: She is alert. Mental status is at baseline.  Psychiatric:        Mood and Affect: Mood normal.        Behavior: Behavior normal.        Thought Content: Thought content normal.        Judgment: Judgment normal.     BP (!) 148/82  Pulse 79   Temp (!) 97.5 F (36.4 C)   Resp 14   Ht 4\' 10"  (1.473 m)   Wt 117 lb 9.6 oz (53.3 kg)   SpO2 100%   BMI 24.58 kg/m  Wt Readings from Last 3 Encounters:  04/28/20 117 lb 9.6 oz (53.3 kg)  01/21/20 114 lb (51.7 kg)  10/22/19 115 lb 12.8 oz (52.5 kg)     There are no preventive care reminders to display for this patient.  There are no preventive care reminders to display for this patient.  Lab Results  Component Value Date   TSH 1.75 10/17/2012   Lab Results  Component Value Date   WBC 7.0 02/07/2019   HGB 12.1 02/07/2019   HCT 36.7 02/07/2019   MCV 94 02/07/2019   PLT 218 02/07/2019   Lab Results  Component Value Date   NA 135 01/21/2020   K 3.8 01/21/2020   CO2 25 01/21/2020   GLUCOSE 84 01/21/2020   BUN 13 01/21/2020   CREATININE 0.68 01/21/2020   BILITOT 0.3 01/21/2020   ALKPHOS 58 01/21/2020   AST 22 01/21/2020   ALT 12 01/21/2020   PROT 7.1 01/21/2020   ALBUMIN 4.3 01/21/2020   CALCIUM 9.5 01/21/2020   Lab Results  Component Value Date   CHOL 181 01/21/2020   Lab Results  Component Value Date   HDL 93 01/21/2020   Lab Results  Component Value Date   LDLCALC 78 01/21/2020   Lab Results  Component Value Date   TRIG 47 01/21/2020   Lab Results  Component Value Date   CHOLHDL 1.9 01/21/2020   Lab Results  Component Value Date   HGBA1C 5.1 04/22/2016      Assessment & Plan:   Problem List Items Addressed This Visit      Cardiovascular and Mediastinum   HTN (hypertension) - Primary   Relevant Medications   triamterene-hydrochlorothiazide  (MAXZIDE-25) 37.5-25 MG tablet   rosuvastatin (CRESTOR) 5 MG tablet     Respiratory   Asthma   Relevant Medications   Fluticasone-Salmeterol (ADVAIR) 100-50 MCG/DOSE AEPB   zafirlukast (ACCOLATE) 20 MG tablet     Other   Seasonal allergies   Relevant Medications   fluticasone (FLONASE) 50 MCG/ACT nasal spray   cetirizine (ZYRTEC) 10 MG tablet   Hyperlipidemia (Chronic)   Relevant Medications   triamterene-hydrochlorothiazide (MAXZIDE-25) 37.5-25 MG tablet   rosuvastatin (CRESTOR) 5 MG tablet    Other Visit Diagnoses    Essential hypertension       Relevant Medications   triamterene-hydrochlorothiazide (MAXZIDE-25) 37.5-25 MG tablet   rosuvastatin (CRESTOR) 5 MG tablet      Meds ordered this encounter  Medications  . triamterene-hydrochlorothiazide (MAXZIDE-25) 37.5-25 MG tablet    Sig: Take 0.5 tablets by mouth daily.    Dispense:  45 tablet    Refill:  5    Order Specific Question:   Supervising Provider    Answer:   04-[birth year]1992 Quentin Angst  . Fluticasone-Salmeterol (ADVAIR) 100-50 MCG/DOSE AEPB    Sig: Inhale 1 puff into the lungs 2 (two) times daily.    Dispense:  60 each    Refill:  5    Order Specific Question:   Supervising Provider    Answer:   [id number] Quentin Angst  . fluticasone (FLONASE) 50 MCG/ACT nasal spray    Sig: Place 2 sprays into both nostrils daily.    Dispense:  16 g    Refill:  5    Order  Specific Question:   Supervising Provider    Answer:   Quentin Angst [id number]  . zafirlukast (ACCOLATE) 20 MG tablet    Sig: Take 1 tablet (20 mg total) by mouth 2 (two) times daily.    Dispense:  180 tablet    Refill:  5    Order Specific Question:   Supervising Provider    Answer:   Quentin Angst [id number]  . rosuvastatin (CRESTOR) 5 MG tablet    Sig: Take 1 tablet (5 mg total) by mouth daily.    Dispense:  90 tablet    Refill:  1    Order Specific Question:   Supervising Provider    Answer:   Quentin Angst  [id number]  . cetirizine (ZYRTEC) 10 MG tablet    Sig: Take 1 tablet (10 mg total) by mouth daily.    Dispense:  90 tablet    Refill:  1    Order Specific Question:   Supervising Provider    Answer:   Jeanann Lewandowsky E [id number]   1. Primary hypertension Blood pressure is very well controlled at home.  Blood pressure is typically elevated while in office. BP 140/70   Pulse 79   Temp (!) 97.5 F (36.4 C)   Resp 14   Ht 4\' 10"  (1.473 m)   Wt 117 lb 9.6 oz (53.3 kg)   SpO2 100%   BMI 24.58 kg/m  - Continue medication, monitor blood pressure at home. Continue DASH diet. Reminder to go to the ER if any CP, SOB, nausea, dizziness, severe HA, changes vision/speech, left arm numbness and tingling and jaw pain.   . - Basic Metabolic Panel - Urinalysis, Routine w reflex microscopic  2. Mixed hyperlipidemia - rosuvastatin (CRESTOR) 5 MG tablet; Take 1 tablet (5 mg total) by mouth daily.  Dispense: 90 tablet; Refill: 1  3. Essential hypertension - triamterene-hydrochlorothiazide (MAXZIDE-25) 37.5-25 MG tablet; Take 0.5 tablets by mouth daily.  Dispense: 45 tablet; Refill: 5  4. Mild intermittent asthma without complication - Fluticasone-Salmeterol (ADVAIR) 100-50 MCG/DOSE AEPB; Inhale 1 puff into the lungs 2 (two) times daily.  Dispense: 60 each; Refill: 5 - zafirlukast (ACCOLATE) 20 MG tablet; Take 1 tablet (20 mg total) by mouth 2 (two) times daily.  Dispense: 180 tablet; Refill: 5  5. Seasonal allergies - fluticasone (FLONASE) 50 MCG/ACT nasal spray; Place 2 sprays into both nostrils daily.  Dispense: 16 g; Refill: 5 - cetirizine (ZYRTEC) 10 MG tablet; Take 1 tablet (10 mg total) by mouth daily.  Dispense: 90 tablet; Refill: 1   Follow-up: Return in about 3 months (around 07/29/2020) for hypertension, diabetes.    09/26/2020  APRN, MSN, FNP-C [facility] [address] [place], [place] [phone]

## 2020-04-29 ENCOUNTER — Telehealth: Payer: Self-pay | Admitting: Family Medicine

## 2020-04-29 LAB — BASIC METABOLIC PANEL
BUN/Creatinine Ratio: 14 (ref 12–28)
BUN: 10 mg/dL (ref 8–27)
CO2: 25 mmol/L (ref 20–29)
Calcium: 9.5 mg/dL (ref 8.7–10.3)
Chloride: 94 mmol/L — ABNORMAL LOW (ref 96–106)
Creatinine, Ser: 0.74 mg/dL (ref 0.57–1.00)
GFR calc Af Amer: 85 mL/min/{1.73_m2} (ref >59)
GFR calc non Af Amer: 74 mL/min/{1.73_m2} (ref >59)
Glucose: 85 mg/dL (ref 65–99)
Potassium: 3.8 mmol/L (ref 3.5–5.2)
Sodium: 133 mmol/L — ABNORMAL LOW (ref 134–144)

## 2020-04-29 LAB — URINALYSIS, ROUTINE W REFLEX MICROSCOPIC
Bilirubin, UA: NEGATIVE
Glucose, UA: NEGATIVE
Ketones, UA: NEGATIVE
Leukocytes,UA: NEGATIVE
Nitrite, UA: NEGATIVE
Protein,UA: NEGATIVE
RBC, UA: NEGATIVE
Specific Gravity, UA: 1.007 (ref 1.005–1.030)
Urobilinogen, Ur: 0.2 mg/dL (ref 0.2–1.0)
pH, UA: 7.5 (ref 5.0–7.5)

## 2020-04-29 NOTE — Telephone Encounter (Signed)
Left voicemail for callback.

## 2020-04-29 NOTE — Telephone Encounter (Signed)
-----   Message from Massie Maroon, [place] sent at 04/29/2020  3:30 PM EDT ----- Regarding: lab results Please inform patient that laboratory values are largely within normal range.  Sodium is a little on the lower side, inform her that she does not have to use salt substitute.  Use salt additives in moderation.  Continue to increase daily activity.  No medication changes warranted.  Follow-up in office as scheduled.  Nolon Nations  APRN, MSN, FNP-C Patient Care [facility] [address] [place], [place] [address] [phone]

## 2020-04-29 NOTE — Telephone Encounter (Signed)
Patient returned phone call, results given. Verbally understood. No additional questions.  

## 2020-06-05 ENCOUNTER — Other Ambulatory Visit: Payer: Self-pay | Admitting: Family Medicine

## 2020-06-05 DIAGNOSIS — J302 Other seasonal allergic rhinitis: Secondary | ICD-10-CM

## 2020-06-05 NOTE — Telephone Encounter (Signed)
Please see refill request.

## 2020-07-04 ENCOUNTER — Other Ambulatory Visit: Payer: Self-pay | Admitting: Family Medicine

## 2020-07-04 DIAGNOSIS — J302 Other seasonal allergic rhinitis: Secondary | ICD-10-CM

## 2020-09-01 ENCOUNTER — Encounter: Payer: Self-pay | Admitting: Family Medicine

## 2020-09-01 ENCOUNTER — Other Ambulatory Visit: Payer: Self-pay

## 2020-09-01 ENCOUNTER — Ambulatory Visit (INDEPENDENT_AMBULATORY_CARE_PROVIDER_SITE_OTHER): Payer: Medicare PPO | Admitting: Family Medicine

## 2020-09-01 VITALS — BP 146/70 | HR 77 | Temp 98.1°F | Ht <= 58 in | Wt 117.0 lb

## 2020-09-01 DIAGNOSIS — H9193 Unspecified hearing loss, bilateral: Secondary | ICD-10-CM

## 2020-09-01 DIAGNOSIS — I10 Essential (primary) hypertension: Secondary | ICD-10-CM | POA: Diagnosis not present

## 2020-09-01 DIAGNOSIS — E782 Mixed hyperlipidemia: Secondary | ICD-10-CM

## 2020-09-01 DIAGNOSIS — J4520 Mild intermittent asthma, uncomplicated: Secondary | ICD-10-CM

## 2020-09-01 LAB — POCT URINALYSIS DIPSTICK
Bilirubin, UA: NEGATIVE
Blood, UA: NEGATIVE
Glucose, UA: NEGATIVE
Ketones, UA: NEGATIVE
Leukocytes, UA: NEGATIVE
Nitrite, UA: NEGATIVE
Protein, UA: POSITIVE — AB
Spec Grav, UA: 1.02 (ref 1.010–1.025)
Urobilinogen, UA: 0.2 E.U./dL
pH, UA: 7 (ref 5.0–8.0)

## 2020-09-01 NOTE — Patient Instructions (Signed)

## 2020-09-01 NOTE — Progress Notes (Signed)
[facility]      Subjective:  Patient ID: Sarah Valenzuela, female    DOB: [birth year]  Age: 87 y.o. MRN: [id number]  CC:  Chief Complaint  Patient presents with   Follow-up    Having echo in left ear about 1 week , no pain , ringing,  dull headaches , no fever     HPI Sarah Valenzuela is a very pleasant 87 year old female with a medical history significant for essential hypertension, hyperlipidemia, mild intermittent asthma, history of seasonal allergies, and history of osteoporosis presents for follow-up of chronic conditions.  Patient states that she has been feeling very well Has been more active in the community. Patient is happy about her church reopening, so she is able to return to church on Sundays.    She says that she is up-to-date with vaccinations.  She received COVID-19 vaccination and received her booster on March 05, 2020. Patient is very active. She is independent with all ADLs. She is a widow and lives alone. She has 1 daughter that lives in [place].   Patient is complaining about a periodic echo to left ear. She denies any headache, ear pain, or recent ear infections. She has been evaluated by audiologist in the past.   Patient says that she has been taking all medications without interruption.  She has been checking her blood pressure at home daily per cardiologist.   Patient denies any lower extremity swelling, chest pain, urinary symptoms, nausea, vomiting, or diarrhea.  Past Medical History:  Diagnosis Date   Asthma    Hypercholesteremia    Hyperlipidemia    Hypertension    Osteoporosis 02/2017   T score -3.1   Situational stress     History reviewed. No pertinent surgical history.  Family History  Problem Relation Age of Onset   Cancer Brother        Prostate   Breast cancer Sister        UNSURE   Diabetes Sister    Heart disease Sister     Social History    Socioeconomic History   Marital status: Widowed    Spouse name: Not on file   Number of children: Not on file   Years of education: Not on file   Highest education level: Not on file  Occupational History   Not on file  Tobacco Use   Smoking status: Never Smoker   Smokeless tobacco: Never Used  Vaping Use   Vaping Use: Never used  Substance and Sexual Activity   Alcohol use: No   Drug use: No   Sexual activity: Not Currently    Comment: declined insurance questions  Other Topics Concern   Not on file  Social History Narrative   Not on file   Social Determinants of Health   Financial Resource Strain: Not on file  Food Insecurity: Not on file  Transportation Needs: Not on file  Physical Activity: Not on file  Stress: Not on file  Social Connections: Not on file  Intimate Partner Violence: Not on file    Outpatient Medications Prior to Visit  Medication Sig Dispense Refill   acetaminophen (TYLENOL) 500 MG tablet Take 1 tablet (500 mg total) by mouth every 6 (six) hours as needed for pain. 30 tablet 0   aspirin 81 MG chewable tablet Chew 81 mg by mouth daily.     calcium citrate (CALCITRATE - DOSED IN MG ELEMENTAL CALCIUM) 950 MG tablet Take 1  tablet by mouth 2 (two) times daily.     cetirizine (ZYRTEC) 10 MG tablet Take 1 tablet (10 mg total) by mouth daily. 90 tablet 1   Cholecalciferol (VITAMIN D3) 125 MCG (5000 UT) CAPS Take 1 capsule by mouth daily.     clobetasol ointment (TEMOVATE) 0.05 % APPLY TOPICALY TWICE WEEKLY LONG TERM. 30 g 4   cycloSPORINE (RESTASIS) 0.05 % ophthalmic emulsion Place 1 drop into both eyes 2 (two) times daily.     feeding supplement, ENSURE COMPLETE, (ENSURE COMPLETE) LIQD Take 237 mLs by mouth 2 (two) times daily between meals. Two bottles daily 16109 mL 12   fluticasone (FLONASE) 50 MCG/ACT nasal spray USE 2 SPRAYS IN EACH NOSTRIL ONCE DAILY 16 g 2   Fluticasone-Salmeterol (ADVAIR) 100-50 MCG/DOSE AEPB Inhale 1 puff  into the lungs 2 (two) times daily. 60 each 5   ipratropium (ATROVENT HFA) 17 MCG/ACT inhaler USE 2 PUFFS 3 TIMES DAILY AS NEEDED FOR SHORTNESS OF BREATH. 25.8 g 5   rosuvastatin (CRESTOR) 5 MG tablet Take 1 tablet (5 mg total) by mouth daily. 90 tablet 1   SENNOSIDES PO Take 8.5 mg by mouth as needed (Swiss Kriss OTC Laxative).     triamterene-hydrochlorothiazide (MAXZIDE-25) 37.5-25 MG tablet Take 0.5 tablets by mouth daily. 45 tablet 5   zafirlukast (ACCOLATE) 20 MG tablet Take 1 tablet (20 mg total) by mouth 2 (two) times daily. 180 tablet 5   docusate sodium (COLACE) 100 MG capsule Take 1 capsule (100 mg total) by mouth daily. 30 capsule 2   Vaginal Moisturizer (VAGISIL FEMININE MOISTURIZER) LOTN Place vaginally 3 (three) times daily.     No facility-administered medications prior to visit.    Allergies  Allergen Reactions   Penicillins Other (See Comments)    Breaks out. Longtime allergy    ROS Review of Systems  Constitutional: Negative.   HENT: Negative.   Eyes: Negative.   Respiratory: Negative.   Cardiovascular: Negative.   Gastrointestinal: Negative.   Endocrine: Negative.   Genitourinary: Negative.   Skin: Negative.   Psychiatric/Behavioral: Negative.       Objective:    Physical Exam Constitutional:      Appearance: Normal appearance.  HENT:     Right Ear: Tympanic membrane normal.     Left Ear: Tympanic membrane normal. Decreased hearing noted.  Eyes:     Pupils: Pupils are equal, round, and reactive to light.  Cardiovascular:     Rate and Rhythm: Normal rate and regular rhythm.     Pulses: Normal pulses.  Pulmonary:     Effort: Pulmonary effort is normal.  Abdominal:     General: Bowel sounds are normal.     Palpations: Abdomen is soft.  Skin:    General: Skin is warm.  Neurological:     General: No focal deficit present.     Mental Status: She is alert. Mental status is at baseline.  Psychiatric:        Mood and Affect: Mood normal.         Behavior: Behavior normal.        Thought Content: Thought content normal.        Judgment: Judgment normal.     BP (!) 159/69 (BP Location: Left Arm, Patient Position: Sitting, Cuff Size: Normal)    Pulse 77    Temp 98.1 F (36.7 C) (Temporal)    Ht 4\' 10"  (1.473 m)    Wt 117 lb (53.1 kg)    SpO2 98%  BMI 24.45 kg/m  Wt Readings from Last 3 Encounters:  09/01/20 117 lb (53.1 kg)  04/28/20 117 lb 9.6 oz (53.3 kg)  01/21/20 114 lb (51.7 kg)     There are no preventive care reminders to display for this patient.  There are no preventive care reminders to display for this patient.  Lab Results  Component Value Date   TSH 1.75 10/17/2012   Lab Results  Component Value Date   WBC 7.0 02/07/2019   HGB 12.1 02/07/2019   HCT 36.7 02/07/2019   MCV 94 02/07/2019   PLT 218 02/07/2019   Lab Results  Component Value Date   NA 133 (L) 04/28/2020   K 3.8 04/28/2020   CO2 25 04/28/2020   GLUCOSE 85 04/28/2020   BUN 10 04/28/2020   CREATININE 0.74 04/28/2020   BILITOT 0.3 01/21/2020   ALKPHOS 58 01/21/2020   AST 22 01/21/2020   ALT 12 01/21/2020   PROT 7.1 01/21/2020   ALBUMIN 4.3 01/21/2020   CALCIUM 9.5 04/28/2020   Lab Results  Component Value Date   CHOL 181 01/21/2020   Lab Results  Component Value Date   HDL 93 01/21/2020   Lab Results  Component Value Date   LDLCALC 78 01/21/2020   Lab Results  Component Value Date   TRIG 47 01/21/2020   Lab Results  Component Value Date   CHOLHDL 1.9 01/21/2020   Lab Results  Component Value Date   HGBA1C 5.1 04/22/2016      Assessment & Plan:   Problem List Items Addressed This Visit      Other   Hyperlipidemia - Primary (Chronic)      Mixed hyperlipidemia Most recent lipid panel within normal limits. Patient will continue low dose statin therapy - POCT Urinalysis Dipstick  Essential hypertension BP (!) 146/70 (BP Location: Left Arm)    Pulse 77    Temp 98.1 F (36.7 C) (Temporal)    Ht 4\' 10"   (1.473 m)    Wt 117 lb (53.1 kg)    SpO2 98%    BMI 24.45 kg/m  Blood pressure well controlled, no medication changes warranted at this time.  - Continue medication, monitor blood pressure at home. Continue DASH diet. Reminder to go to the ER if any CP, SOB, nausea, dizziness, severe HA, changes vision/speech, left arm numbness and tingling and jaw pain.    Decreased hearing of both ears Schedule appointment with audiologist  Mild intermittent asthma without complication Asthma is well controlled. No medication changes at this time.    Follow-up: Return in about 3 months (around 12/02/2020).    02/01/2021  APRN, MSN, FNP-C Patient Care [facility] [address] [place], [place] [phone]

## 2020-09-07 ENCOUNTER — Other Ambulatory Visit: Payer: Self-pay

## 2020-09-07 ENCOUNTER — Encounter: Payer: Self-pay | Admitting: Obstetrics & Gynecology

## 2020-09-07 ENCOUNTER — Ambulatory Visit: Payer: Medicare PPO | Admitting: Obstetrics & Gynecology

## 2020-09-07 VITALS — BP 134/72 | Ht <= 58 in | Wt 116.0 lb

## 2020-09-07 DIAGNOSIS — Z780 Asymptomatic menopausal state: Secondary | ICD-10-CM

## 2020-09-07 DIAGNOSIS — N904 Leukoplakia of vulva: Secondary | ICD-10-CM

## 2020-09-07 DIAGNOSIS — Z01419 Encounter for gynecological examination (general) (routine) without abnormal findings: Secondary | ICD-10-CM

## 2020-09-07 DIAGNOSIS — M810 Age-related osteoporosis without current pathological fracture: Secondary | ICD-10-CM

## 2020-09-07 NOTE — Progress Notes (Signed)
Sarah Valenzuela 02-14-1932 [id number]   History:    87 y.o. G1P1L1 Widowed. Daughter lives in [place].  GB:[id number] presenting for annual gyn exam   WNI:OEVO active at church and stays physically fit,will resume [employer] fitness now. Postmenopause, well on no hormone replacement therapy. No postmenopausal bleeding. No pelvic pain. Lichen Sclerosus, doing very well on Clobetasol ointment twice a week.  Abstinent. Urine and bowel movements normal. Breasts normal. Body mass index 25.1. Health labs with family physician.  Past medical history,surgical history, family history and social history were all reviewed and documented in the EPIC chart.  Gynecologic History No LMP recorded. Patient is postmenopausal.  Obstetric History OB History  Gravida Para Term Preterm AB Living  1 1     0 1  SAB IAB Ectopic Multiple Live Births      0        # Outcome Date GA Lbr Len/2nd Weight Sex Delivery Anes PTL Lv  1 Para              ROS: A ROS was performed and pertinent positives and negatives are included in the history. GENERAL: No fevers or chills. HEENT: No change in vision, no earache, sore throat or sinus congestion. NECK: No pain or stiffness. CARDIOVASCULAR: No chest pain or pressure. No palpitations. PULMONARY: No shortness of breath, cough or wheeze. GASTROINTESTINAL: No abdominal pain, nausea, vomiting or diarrhea, melena or bright red blood per rectum. GENITOURINARY: No urinary frequency, urgency, hesitancy or dysuria. MUSCULOSKELETAL: No joint or muscle pain, no back pain, no recent trauma. DERMATOLOGIC: No rash, no itching, no lesions. ENDOCRINE: No polyuria, polydipsia, no heat or cold intolerance. No recent change in weight. HEMATOLOGICAL: No anemia or easy bruising or bleeding. NEUROLOGIC: No headache, seizures, numbness, tingling or weakness. PSYCHIATRIC: No depression, no loss of interest in normal activity or change in sleep pattern.     Exam:   BP  134/72   Ht 4\' 9"  (1.448 m)   Wt 116 lb (52.6 kg)   BMI 25.10 kg/m   Body mass index is 25.1 kg/m.  General appearance : Well developed well nourished female. No acute distress HEENT: Eyes: no retinal hemorrhage or exudates,  Neck supple, trachea midline, no carotid bruits, no thyroidmegaly Lungs: Clear to auscultation, no rhonchi or wheezes, or rib retractions  Heart: Regular rate and rhythm, no murmurs or gallops Breast:Examined in sitting and supine position were symmetrical in appearance, no palpable masses or tenderness,  no skin retraction, no nipple inversion, no nipple discharge, no skin discoloration, no axillary or supraclavicular lymphadenopathy Abdomen: no palpable masses or tenderness, no rebound or guarding Extremities: no edema or skin discoloration or tenderness  Pelvic: Vulva: Normal             Vagina: No gross lesions or discharge  Cervix: No gross lesions or discharge  Uterus  AV, normal size, shape and consistency, non-tender and mobile  Adnexa  Without masses or tenderness  Anus: Normal   Assessment/Plan:  87 y.o. female for annual exam   1. Well female exam with routine gynecological exam Gynecologic exam in menopause.  No need to repeat a Pap test at this time.  Breast exam normal.  No longer doing screening mammo, last negative in 2020.  Health labs with Fam MD.  Good BMI at 25.1.  Continue with fitness and healthy nutrition.    2. Postmenopausal Well on no HRT.  No PMB.  3. Lichen sclerosus et atrophicus of the vulva Continue Clobetasol  twice a week.  Prescription sent to pharmacy recently.  4. Age-related osteoporosis without current pathological fracture Osteoporosis at the [facility] in 2018.  On Vit D/Ca++ 1.5 g/d and regular weight bearing activities.  Declines repeating a Bone Density at this time, prefers no medical therapy.  Genia Del MD, 2:36 PM 09/07/2020

## 2020-09-21 ENCOUNTER — Other Ambulatory Visit: Payer: Self-pay | Admitting: Family Medicine

## 2020-09-21 DIAGNOSIS — J302 Other seasonal allergic rhinitis: Secondary | ICD-10-CM

## 2020-11-20 ENCOUNTER — Other Ambulatory Visit: Payer: Self-pay | Admitting: Family Medicine

## 2020-11-20 DIAGNOSIS — E782 Mixed hyperlipidemia: Secondary | ICD-10-CM

## 2020-12-03 ENCOUNTER — Other Ambulatory Visit: Payer: Self-pay | Admitting: Obstetrics & Gynecology

## 2020-12-03 NOTE — Telephone Encounter (Signed)
Per note on 09/07/20 "Continue Clobetasol twice a week." Rx sent.

## 2020-12-08 ENCOUNTER — Ambulatory Visit (INDEPENDENT_AMBULATORY_CARE_PROVIDER_SITE_OTHER): Payer: Medicare PPO | Admitting: Family Medicine

## 2020-12-08 ENCOUNTER — Encounter: Payer: Self-pay | Admitting: Family Medicine

## 2020-12-08 ENCOUNTER — Other Ambulatory Visit: Payer: Self-pay

## 2020-12-08 VITALS — BP 146/67 | HR 77 | Temp 97.7°F | Ht <= 58 in | Wt 119.0 lb

## 2020-12-08 DIAGNOSIS — J4520 Mild intermittent asthma, uncomplicated: Secondary | ICD-10-CM | POA: Diagnosis not present

## 2020-12-08 DIAGNOSIS — E782 Mixed hyperlipidemia: Secondary | ICD-10-CM | POA: Diagnosis not present

## 2020-12-08 DIAGNOSIS — I10 Essential (primary) hypertension: Secondary | ICD-10-CM | POA: Diagnosis not present

## 2020-12-08 LAB — POCT URINALYSIS DIPSTICK
Bilirubin, UA: NEGATIVE
Glucose, UA: NEGATIVE
Ketones, UA: NEGATIVE
Nitrite, UA: NEGATIVE
Protein, UA: NEGATIVE
Spec Grav, UA: 1.02 (ref 1.010–1.025)
Urobilinogen, UA: 0.2 E.U./dL
pH, UA: 7.5 (ref 5.0–8.0)

## 2020-12-08 NOTE — Progress Notes (Signed)
[facility]  Established Patient Office Visit  Subjective:  Patient ID: Sarah Valenzuela, female    DOB: 1931/11/15  Age: 87 y.o. MRN: [id number]  CC:  Chief Complaint  Patient presents with   Follow-up    No questions or concerns.     HPI Sarah Valenzuela is an 87 year old female with a medical history significant for essential hypertension, mixed hyperlipidemia, mild intermittent asthma, and history of seasonal allergies that presents for follow-up of chronic conditions.  Sarah Valenzuela says that she has been doing well and does not have any complaints on today.  Patient has been staying active with her church.  She has not returned to [facility] for exercise due to COVID-19.  Patient has regular daily activities that includes household chores.  She typically eats a well-balanced diet.  She denies any headaches, chest pain, dizziness, shortness of breath, lower extremity swelling, nausea, vomiting, or diarrhea.  Patient has a very good appetite.  She is followed routinely by gynecologist and cardiologist.  She typically follows up with her cardiologist every 6 months.  Patient's blood pressure has been well controlled on current medication regimen.  She does not check blood pressures at home.  Most recent cholesterol panel was completely within normal range.  Patient has 1 daughter that lives in [place] that she sees on a regular basis.  Her husband has been deceased for many years.     Past Medical History:  Diagnosis Date   Asthma    Hypercholesteremia    Hyperlipidemia    Hypertension    Osteoporosis 02/2017   T score -3.1   Situational stress     No past surgical history on file.  Family History  Problem Relation Age of Onset   Cancer Brother        Prostate   Breast cancer Sister        UNSURE   Diabetes Sister    Heart disease Sister     Social History   Socioeconomic History   Marital status: Widowed     Spouse name: Not on file   Number of children: Not on file   Years of education: Not on file   Highest education level: Not on file  Occupational History   Not on file  Tobacco Use   Smoking status: Never   Smokeless tobacco: Never  Vaping Use   Vaping Use: Never used  Substance and Sexual Activity   Alcohol use: No   Drug use: No   Sexual activity: Not Currently    Comment: declined insurance questions  Other Topics Concern   Not on file  Social History Narrative   Not on file   Social Determinants of Health   Financial Resource Strain: Not on file  Food Insecurity: Not on file  Transportation Needs: Not on file  Physical Activity: Not on file  Stress: Not on file  Social Connections: Not on file  Intimate Partner Violence: Not on file    Outpatient Medications Prior to Visit  Medication Sig Dispense Refill   acetaminophen (TYLENOL) 500 MG tablet Take 1 tablet (500 mg total) by mouth every 6 (six) hours as needed for pain. 30 tablet 0   aspirin 81 MG chewable tablet Chew 81 mg by mouth daily.     calcium citrate (CALCITRATE - DOSED IN MG ELEMENTAL CALCIUM) 950 MG tablet Take 1 tablet by mouth 2 (two) times daily.     cetirizine (ZYRTEC) 10  MG tablet Take 1 tablet (10 mg total) by mouth daily. 90 tablet 1   Cholecalciferol (VITAMIN D3) 125 MCG (5000 UT) CAPS Take 1 capsule by mouth daily.     clobetasol ointment (TEMOVATE) 0.05 % APPLY TWICE A WEEK, LONG TERM. 30 g 4   cycloSPORINE (RESTASIS) 0.05 % ophthalmic emulsion Place 1 drop into both eyes 2 (two) times daily.     feeding supplement, ENSURE COMPLETE, (ENSURE COMPLETE) LIQD Take 237 mLs by mouth 2 (two) times daily between meals. Two bottles daily 00923 mL 12   fluticasone (FLONASE) 50 MCG/ACT nasal spray USE 2 SPRAYS IN EACH NOSTRIL ONCE DAILY 16 g 2   Fluticasone-Salmeterol (ADVAIR) 100-50 MCG/DOSE AEPB Inhale 1 puff into the lungs 2 (two) times daily. 60 each 5   ipratropium (ATROVENT HFA) 17 MCG/ACT inhaler  USE 2 PUFFS 3 TIMES DAILY AS NEEDED FOR SHORTNESS OF BREATH. 25.8 g 5   rosuvastatin (CRESTOR) 5 MG tablet TAKE 1 TABLET ONCE DAILY. 90 tablet 1   SENNOSIDES PO Take 8.5 mg by mouth as needed (Swiss Kriss OTC Laxative).     triamterene-hydrochlorothiazide (MAXZIDE-25) 37.5-25 MG tablet Take 0.5 tablets by mouth daily. 45 tablet 5   zafirlukast (ACCOLATE) 20 MG tablet Take 1 tablet (20 mg total) by mouth 2 (two) times daily. 180 tablet 5   No facility-administered medications prior to visit.    Allergies  Allergen Reactions   Penicillins Other (See Comments)    Breaks out. Longtime allergy    ROS Review of Systems  Constitutional: Negative.   HENT: Negative.    Eyes: Negative.   Respiratory: Negative.    Cardiovascular: Negative.   Gastrointestinal: Negative.   Endocrine: Negative.   Genitourinary: Negative.   Musculoskeletal: Negative.  Negative for arthralgias and back pain.  Neurological: Negative.   Psychiatric/Behavioral: Negative.       Objective:    Physical Exam Constitutional:      Appearance: Normal appearance.  HENT:     Nose: Nose normal.  Eyes:     Pupils: Pupils are equal, round, and reactive to light.  Cardiovascular:     Rate and Rhythm: Normal rate and regular rhythm.     Pulses: Normal pulses.     Heart sounds: Normal heart sounds.  Pulmonary:     Effort: Pulmonary effort is normal.     Breath sounds: Normal breath sounds.  Abdominal:     General: Bowel sounds are normal.  Neurological:     Mental Status: She is alert.    BP (!) 146/67 (BP Location: Left Arm, Patient Position: Sitting)   Pulse 77   Temp 97.7 F (36.5 C)   Ht 4\' 10"  (1.473 m)   Wt 119 lb 0.4 oz (54 kg)   SpO2 100%   BMI 24.88 kg/m  Wt Readings from Last 3 Encounters:  12/08/20 119 lb 0.4 oz (54 kg)  09/07/20 116 lb (52.6 kg)  09/01/20 117 lb (53.1 kg)     Health Maintenance Due  Topic Date Due   Zoster Vaccines- Shingrix (1 of 2) Never done   COVID-19 Vaccine (4 -  Booster for Pfizer series) 07/28/2020    There are no preventive care reminders to display for this patient.  Lab Results  Component Value Date   TSH 1.75 10/17/2012   Lab Results  Component Value Date   WBC 7.0 02/07/2019   HGB 12.1 02/07/2019   HCT 36.7 02/07/2019   MCV 94 02/07/2019   PLT 218 02/07/2019  Lab Results  Component Value Date   NA 133 (L) 04/28/2020   K 3.8 04/28/2020   CO2 25 04/28/2020   GLUCOSE 85 04/28/2020   BUN 10 04/28/2020   CREATININE 0.74 04/28/2020   BILITOT 0.3 01/21/2020   ALKPHOS 58 01/21/2020   AST 22 01/21/2020   ALT 12 01/21/2020   PROT 7.1 01/21/2020   ALBUMIN 4.3 01/21/2020   CALCIUM 9.5 04/28/2020   Lab Results  Component Value Date   CHOL 181 01/21/2020   Lab Results  Component Value Date   HDL 93 01/21/2020   Lab Results  Component Value Date   LDLCALC 78 01/21/2020   Lab Results  Component Value Date   TRIG 47 01/21/2020   Lab Results  Component Value Date   CHOLHDL 1.9 01/21/2020   Lab Results  Component Value Date   HGBA1C 5.1 04/22/2016      Assessment & Plan:   Problem List Items Addressed This Visit   None Visit Diagnoses     Essential hypertension    -  Primary   Relevant Orders   Urinalysis Dipstick   Basic Metabolic Panel   Lipid Panel      Assessment and plan: Patient has been doing very well on current medication regimen, no changes are warranted.  She was advised to follow-up with specialist as scheduled.  She will continue to follow-up with this provider every 3 months.  Essential hypertension: Reviewed urinalysis, no proteinuria or hematuria.  Will review basic metabolic panel as results become available.- Continue medication, monitor blood pressure at home. Continue DASH diet.  Reminder to go to the ER if any CP, SOB, nausea, dizziness, severe HA, changes vision/speech, left arm numbness and tingling and jaw pain.   Hyperlipidemia: It has been 1 year since lipid panel, will repeat  today.  Also, will fax results to cardiologist.  Mild intermittent asthma: Stable.  No exacerbation in many years.  Health maintenance: Patient is up-to-date with annual wellness.  She is also up-to-date with mammogram.  She does not warrant a repeat colonoscopy at this time. She is up-to-date with all vaccinations. Patient's weight is stable, BMI within normal range.  Follow-up: Return in about 3 months (around 03/10/2021).    Nolon Nations  APRN, MSN, FNP-C [facility] [address] [place], [place] [address] [phone]

## 2020-12-09 LAB — LIPID PANEL
Chol/HDL Ratio: 1.8 ratio (ref 0.0–4.4)
Cholesterol, Total: 177 mg/dL (ref 100–199)
HDL: 98 mg/dL (ref >39)
LDL Chol Calc (NIH): 68 mg/dL (ref 0–99)
Triglycerides: 55 mg/dL (ref 0–149)
VLDL Cholesterol Cal: 11 mg/dL (ref 5–40)

## 2020-12-09 LAB — HEPATIC FUNCTION PANEL
ALT: 17 IU/L (ref 0–32)
AST: 29 IU/L (ref 0–40)
Albumin: 4.7 g/dL — ABNORMAL HIGH (ref 3.6–4.6)
Alkaline Phosphatase: 56 IU/L (ref 44–121)
Bilirubin Total: 0.4 mg/dL (ref 0.0–1.2)
Bilirubin, Direct: 0.13 mg/dL (ref 0.00–0.40)
Total Protein: 7.1 g/dL (ref 6.0–8.5)

## 2020-12-11 ENCOUNTER — Telehealth: Payer: Self-pay | Admitting: Family Medicine

## 2020-12-11 NOTE — Telephone Encounter (Signed)
Patient notified of results. Verbally understood. No additional questions or concerns.

## 2020-12-11 NOTE — Telephone Encounter (Signed)
Sarah Valenzuela is an 87 year old female that presented to clinic on 12/08/2020 for a 64-month follow-up of chronic conditions.  At that time, patient was doing very well and was without complaints.  Reviewed all laboratory values, lipid panel and basic metabolic panel were all within a normal range.  Advised patient to continue following a balanced diet and all activities of daily living.  Try to increase water intake being that the weather outside is gotten a lot hotter.  Advised patient to remain inside during periods of high heat index. Follow-up in clinic in 3 months for her chronic conditions. Fax results to patient's cardiologist, Dr. Narda Amber  APRN, MSN, FNP-C [facility] [address] [place], [place] [address] [phone]

## 2021-03-02 ENCOUNTER — Other Ambulatory Visit: Payer: Self-pay | Admitting: Family Medicine

## 2021-03-02 DIAGNOSIS — J4520 Mild intermittent asthma, uncomplicated: Secondary | ICD-10-CM

## 2021-03-23 ENCOUNTER — Telehealth: Payer: Self-pay | Admitting: Family Medicine

## 2021-03-23 ENCOUNTER — Ambulatory Visit: Payer: Medicare PPO | Admitting: Family Medicine

## 2021-03-23 ENCOUNTER — Encounter: Payer: Self-pay | Admitting: Family Medicine

## 2021-03-23 ENCOUNTER — Other Ambulatory Visit: Payer: Self-pay

## 2021-03-23 ENCOUNTER — Other Ambulatory Visit: Payer: Self-pay | Admitting: Family Medicine

## 2021-03-23 VITALS — BP 144/68 | HR 78 | Temp 97.5°F | Ht 58.5 in | Wt 120.6 lb

## 2021-03-23 DIAGNOSIS — Z23 Encounter for immunization: Secondary | ICD-10-CM | POA: Diagnosis not present

## 2021-03-23 DIAGNOSIS — J4520 Mild intermittent asthma, uncomplicated: Secondary | ICD-10-CM

## 2021-03-23 DIAGNOSIS — I10 Essential (primary) hypertension: Secondary | ICD-10-CM | POA: Diagnosis not present

## 2021-03-23 LAB — POCT URINALYSIS DIP (CLINITEK)
Bilirubin, UA: NEGATIVE
Blood, UA: NEGATIVE
Glucose, UA: NEGATIVE mg/dL
Ketones, POC UA: NEGATIVE mg/dL
Leukocytes, UA: NEGATIVE
Nitrite, UA: NEGATIVE
POC PROTEIN,UA: NEGATIVE
Spec Grav, UA: <=1.005 — AB (ref 1.010–1.025)
Urobilinogen, UA: 0.2 E.U./dL
pH, UA: 6 (ref 5.0–8.0)

## 2021-03-23 MED ORDER — ALBUTEROL SULFATE HFA 108 (90 BASE) MCG/ACT IN AERS: 2.0000 | INHALATION_SPRAY | Freq: Four times a day (QID) | RESPIRATORY_TRACT | 1 refills | Status: DC | PRN

## 2021-03-23 NOTE — Progress Notes (Signed)
[facility]   Established Patient Office Visit  Subjective:  Patient ID: Sarah Valenzuela, female    DOB: May 03, 1932  Age: 87 y.o. MRN: [id number]  CC:  Chief Complaint  Patient presents with   Follow-up    Pt is here for her follow up visit for hypertension. Pt states that she was taking Atrovent but it is expensive to continue to purchase and would like an alternative. Pt also states that she would like to be referred to a closer ENT due to being to far for her to travel. Pt would also like her flu vaccine today as well.    HPI Sarah Valenzuela is a 87 year old female with a medical history significant for essential hypertension, hyperlipidemia, mild intermittent asthma, and history of osteoporosis presents for follow-up of chronic conditions.  Patient states that she has been doing well and is without complaint.  She continues to get out a little more, mostly to her church.  Patient remains active and does follow a low-fat diet divided over small meals throughout the day.  Patient often drinks Ensure between meals being that she has poor appetite at times.  She denies any headache, chest pain, lower extremity swelling, shortness of breath, nausea, vomiting, or diarrhea today.  Past Medical History:  Diagnosis Date   Asthma    Hypercholesteremia    Hyperlipidemia    Hypertension    Osteoporosis 02/2017   T score -3.1   Situational stress     History reviewed. No pertinent surgical history.  Family History  Problem Relation Age of Onset   Cancer Brother        Prostate   Breast cancer Sister        UNSURE   Diabetes Sister    Heart disease Sister     Social History   Socioeconomic History   Marital status: Widowed    Spouse name: Not on file   Number of children: Not on file   Years of education: Not on file   Highest education level: Not on file  Occupational History   Not on file  Tobacco Use   Smoking  status: Never   Smokeless tobacco: Never  Vaping Use   Vaping Use: Never used  Substance and Sexual Activity   Alcohol use: No   Drug use: No   Sexual activity: Not Currently    Comment: declined insurance questions  Other Topics Concern   Not on file  Social History Narrative   Not on file   Social Determinants of Health   Financial Resource Strain: Not on file  Food Insecurity: Not on file  Transportation Needs: Not on file  Physical Activity: Not on file  Stress: Not on file  Social Connections: Not on file  Intimate Partner Violence: Not on file    Outpatient Medications Prior to Visit  Medication Sig Dispense Refill   acetaminophen (TYLENOL) 500 MG tablet Take 1 tablet (500 mg total) by mouth every 6 (six) hours as needed for pain. 30 tablet 0   aspirin 81 MG chewable tablet Chew 81 mg by mouth daily.     calcium citrate (CALCITRATE - DOSED IN MG ELEMENTAL CALCIUM) 950 MG tablet Take 1 tablet by mouth 2 (two) times daily.     cetirizine (ZYRTEC) 10 MG tablet Take 1 tablet (10 mg total) by mouth daily. 90 tablet 1   Cholecalciferol (VITAMIN D3) 125 MCG (5000 UT) CAPS Take 1 capsule by mouth daily.  clobetasol ointment (TEMOVATE) 0.05 % APPLY TWICE A WEEK, LONG TERM. 30 g 4   cycloSPORINE (RESTASIS) 0.05 % ophthalmic emulsion Place 1 drop into both eyes 2 (two) times daily.     feeding supplement, ENSURE COMPLETE, (ENSURE COMPLETE) LIQD Take 237 mLs by mouth 2 (two) times daily between meals. Two bottles daily 83662 mL 12   fluticasone (FLONASE) 50 MCG/ACT nasal spray USE 2 SPRAYS IN EACH NOSTRIL ONCE DAILY 16 g 2   fluticasone-salmeterol (ADVAIR) 100-50 MCG/ACT AEPB INHALE 1 PUFF TWICE DAILY 60 each 5   rosuvastatin (CRESTOR) 5 MG tablet TAKE 1 TABLET ONCE DAILY. 90 tablet 1   SENNOSIDES PO Take 8.5 mg by mouth as needed (Swiss Kriss OTC Laxative).     triamterene-hydrochlorothiazide (MAXZIDE-25) 37.5-25 MG tablet Take 0.5 tablets by mouth daily. 45 tablet 5    zafirlukast (ACCOLATE) 20 MG tablet Take 1 tablet (20 mg total) by mouth 2 (two) times daily. 180 tablet 5   ipratropium (ATROVENT HFA) 17 MCG/ACT inhaler USE 2 PUFFS 3 TIMES DAILY AS NEEDED FOR SHORTNESS OF BREATH. (Patient not taking: Reported on 03/23/2021) 25.8 g 5   No facility-administered medications prior to visit.    Allergies  Allergen Reactions   Penicillins Other (See Comments)    Breaks out. Longtime allergy    ROS Review of Systems  Constitutional: Negative.   HENT: Negative.    Respiratory: Negative.    Cardiovascular: Negative.   Gastrointestinal: Negative.   Endocrine: Negative.   Genitourinary: Negative.   Musculoskeletal:  Positive for arthralgias and back pain.  Skin: Negative.   Psychiatric/Behavioral: Negative.       Objective:    Physical Exam Constitutional:      Appearance: Normal appearance.  Cardiovascular:     Rate and Rhythm: Normal rate and regular rhythm.     Pulses: Normal pulses.  Pulmonary:     Effort: Pulmonary effort is normal.  Abdominal:     General: Bowel sounds are normal.  Musculoskeletal:        General: Normal range of motion.  Skin:    General: Skin is warm.  Neurological:     General: No focal deficit present.     Mental Status: She is alert. Mental status is at baseline.  Psychiatric:        Mood and Affect: Mood normal.        Behavior: Behavior normal.        Thought Content: Thought content normal.        Judgment: Judgment normal.    BP (!) 144/68   Pulse 78   Temp (!) 97.5 F (36.4 C)   Ht 4' 10.5" (1.486 m)   Wt 120 lb 9.6 oz (54.7 kg)   SpO2 100%   BMI 24.78 kg/m  Wt Readings from Last 3 Encounters:  03/23/21 120 lb 9.6 oz (54.7 kg)  12/08/20 119 lb 0.4 oz (54 kg)  09/07/20 116 lb (52.6 kg)     Health Maintenance Due  Topic Date Due   Zoster Vaccines- Shingrix (1 of 2) Never done   COVID-19 Vaccine (4 - Booster for Pfizer series) 07/28/2020    There are no preventive care reminders to display  for this patient.  Lab Results  Component Value Date   TSH 1.75 10/17/2012   Lab Results  Component Value Date   WBC 7.0 02/07/2019   HGB 12.1 02/07/2019   HCT 36.7 02/07/2019   MCV 94 02/07/2019   PLT 218 02/07/2019   Lab  Results  Component Value Date   NA 133 (L) 04/28/2020   K 3.8 04/28/2020   CO2 25 04/28/2020   GLUCOSE 85 04/28/2020   BUN 10 04/28/2020   CREATININE 0.74 04/28/2020   BILITOT 0.4 12/08/2020   ALKPHOS 56 12/08/2020   AST 29 12/08/2020   ALT 17 12/08/2020   PROT 7.1 12/08/2020   ALBUMIN 4.7 (H) 12/08/2020   CALCIUM 9.5 04/28/2020   Lab Results  Component Value Date   CHOL 177 12/08/2020   Lab Results  Component Value Date   HDL 98 12/08/2020   Lab Results  Component Value Date   LDLCALC 68 12/08/2020   Lab Results  Component Value Date   TRIG 55 12/08/2020   Lab Results  Component Value Date   CHOLHDL 1.8 12/08/2020   Lab Results  Component Value Date   HGBA1C 5.1 04/22/2016      Assessment & Plan:   Problem List Items Addressed This Visit   None Visit Diagnoses     Essential hypertension    -  Primary   Relevant Orders   POCT URINALYSIS DIP (CLINITEK)   Needs flu shot       Relevant Orders   Flu Vaccine QUAD 6+ mos PF IM (Fluarix Quad PF)       1. Essential hypertension BP (!) 144/68   Pulse 78   Temp (!) 97.5 F (36.4 C)   Ht 4' 10.5" (1.486 m)   Wt 120 lb 9.6 oz (54.7 kg)   SpO2 100%   BMI 24.78 kg/m  - Continue medication, monitor blood pressure at home. Continue DASH diet.  Reminder to go to the ER if any CP, SOB, nausea, dizziness, severe HA, changes vision/speech, left arm numbness and tingling and jaw pain.   - POCT URINALYSIS DIP (CLINITEK) - Comprehensive metabolic panel  2. Needs flu shot  - Flu Vaccine QUAD 6+ mos PF IM (Fluarix Quad PF)  3. Mild intermittent asthma without complication  - albuterol (VENTOLIN HFA) 108 (90 Base) MCG/ACT inhaler; Inhale 2 puffs into the lungs every 6 (six) hours  as needed for wheezing or shortness of breath.  Dispense: 8 g; Refill: 1    Follow-up: Return in about 3 months (around 06/22/2021) for hypertension, hyperlipidemia.    Nolon Nations  APRN, MSN, FNP-C Patient Care [facility] [address] [place], [place] [address] [phone]

## 2021-03-24 LAB — COMPREHENSIVE METABOLIC PANEL
ALT: 13 IU/L (ref 0–32)
AST: 27 IU/L (ref 0–40)
Albumin/Globulin Ratio: 1.8 (ref 1.2–2.2)
Albumin: 4.8 g/dL — ABNORMAL HIGH (ref 3.6–4.6)
Alkaline Phosphatase: 63 IU/L (ref 44–121)
BUN/Creatinine Ratio: 10 — ABNORMAL LOW (ref 12–28)
BUN: 8 mg/dL (ref 8–27)
Bilirubin Total: 0.4 mg/dL (ref 0.0–1.2)
CO2: 25 mmol/L (ref 20–29)
Calcium: 9.7 mg/dL (ref 8.7–10.3)
Chloride: 92 mmol/L — ABNORMAL LOW (ref 96–106)
Creatinine, Ser: 0.77 mg/dL (ref 0.57–1.00)
Globulin, Total: 2.6 g/dL (ref 1.5–4.5)
Glucose: 79 mg/dL (ref 70–99)
Potassium: 4 mmol/L (ref 3.5–5.2)
Sodium: 131 mmol/L — ABNORMAL LOW (ref 134–144)
Total Protein: 7.4 g/dL (ref 6.0–8.5)
eGFR: 75 mL/min/{1.73_m2} (ref >59)

## 2021-04-13 NOTE — Telephone Encounter (Signed)
Opened in error

## 2021-05-01 ENCOUNTER — Other Ambulatory Visit: Payer: Self-pay | Admitting: Family Medicine

## 2021-05-01 DIAGNOSIS — I10 Essential (primary) hypertension: Secondary | ICD-10-CM

## 2021-05-01 DIAGNOSIS — J4520 Mild intermittent asthma, uncomplicated: Secondary | ICD-10-CM

## 2021-05-01 DIAGNOSIS — J302 Other seasonal allergic rhinitis: Secondary | ICD-10-CM

## 2021-05-18 IMAGING — MG MM DIGITAL SCREENING BILAT W/ TOMO W/ CAD
6 of 10 series · 6 of 30 positions shown · non-contrast
Comparison: Previous exam(s).

CLINICAL DATA: Screening.

EXAM:
DIGITAL SCREENING BILATERAL MAMMOGRAM WITH TOMO AND CAD

[R MLO synth-2D (1 of 2)]
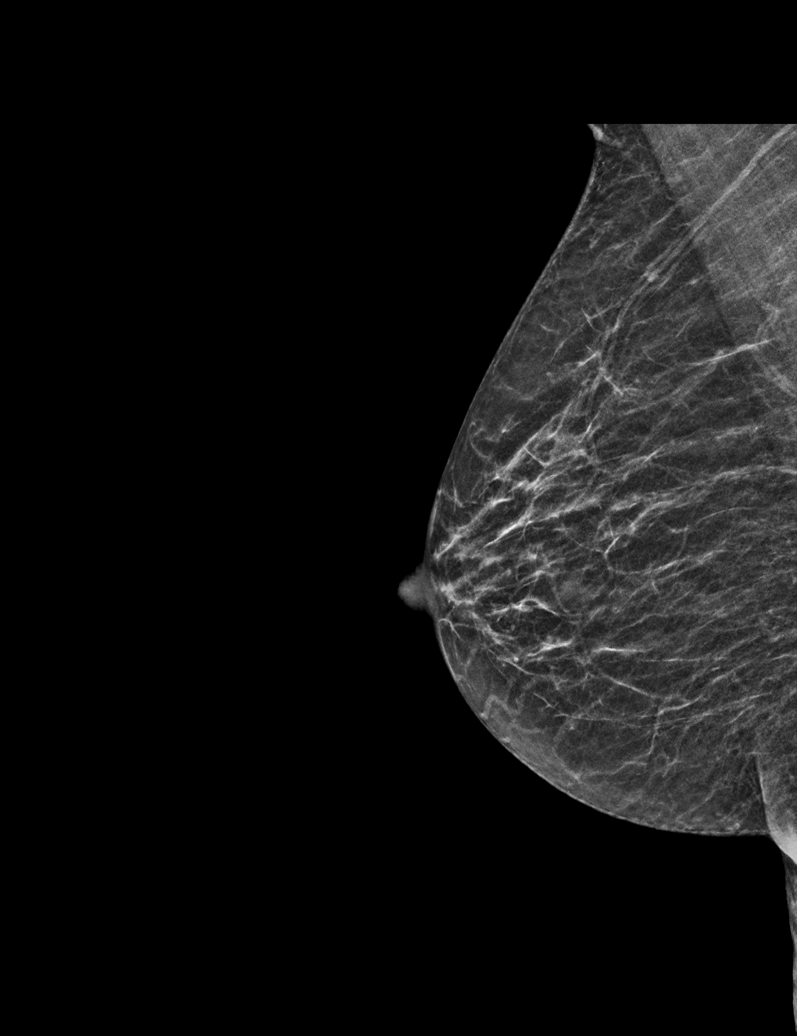

[L MLO synth-2D]
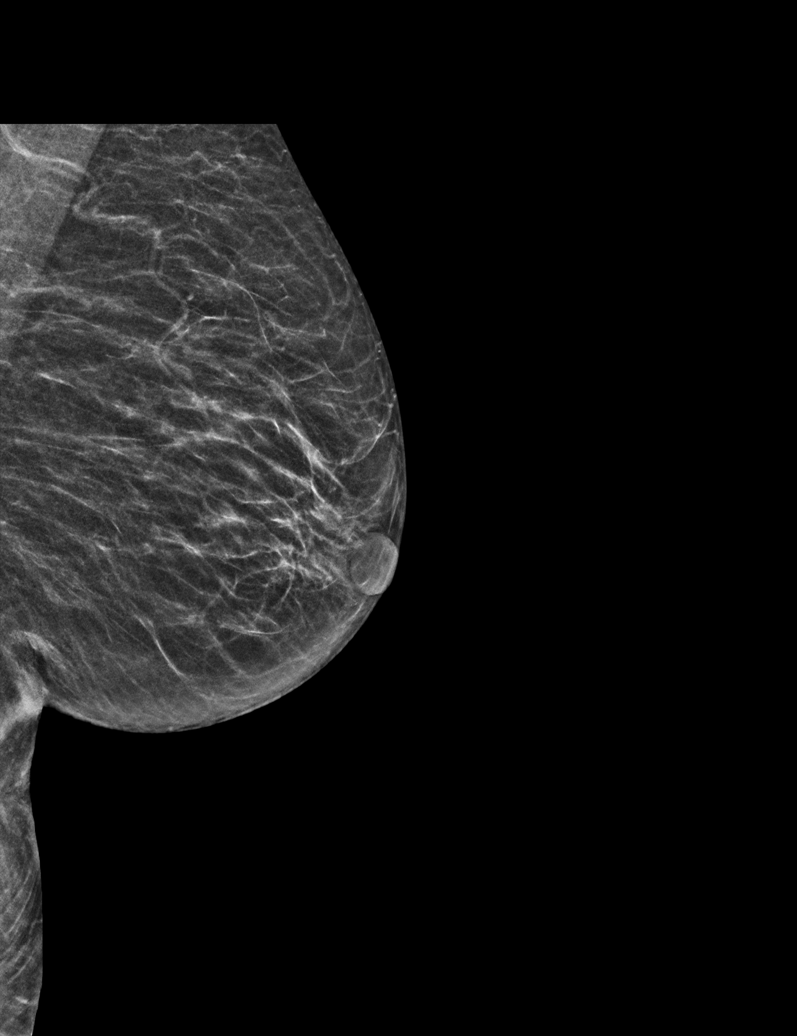

[R CC synth-2D]
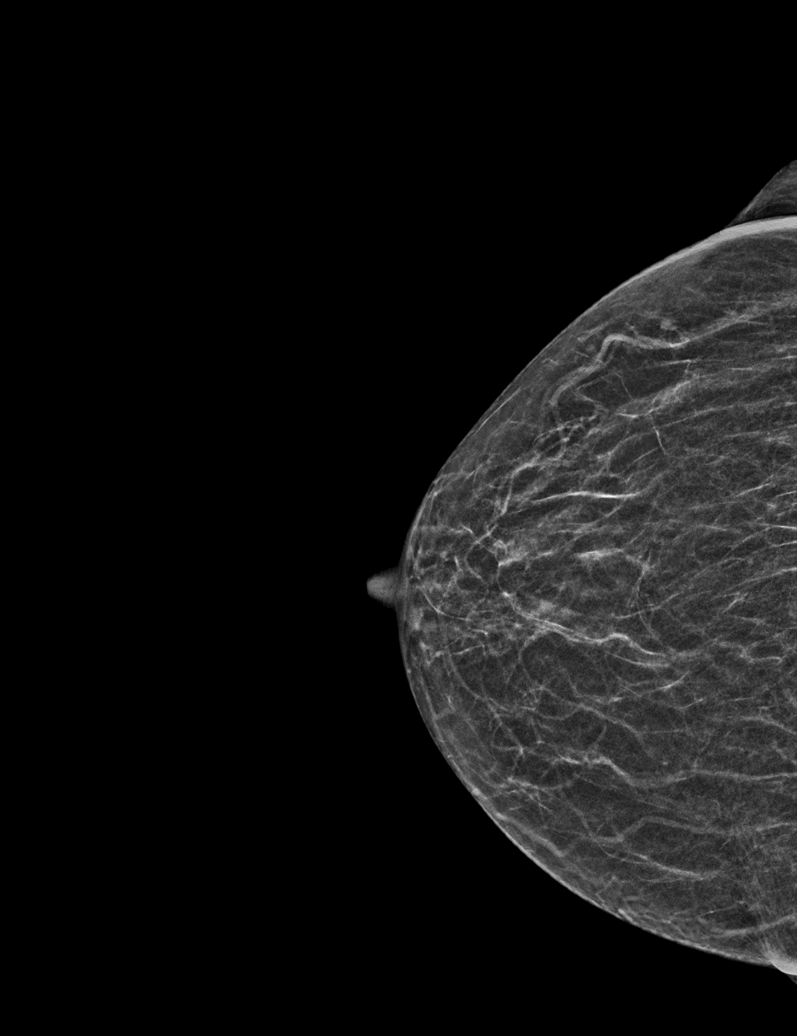

[R MLO synth-2D (2 of 2)]
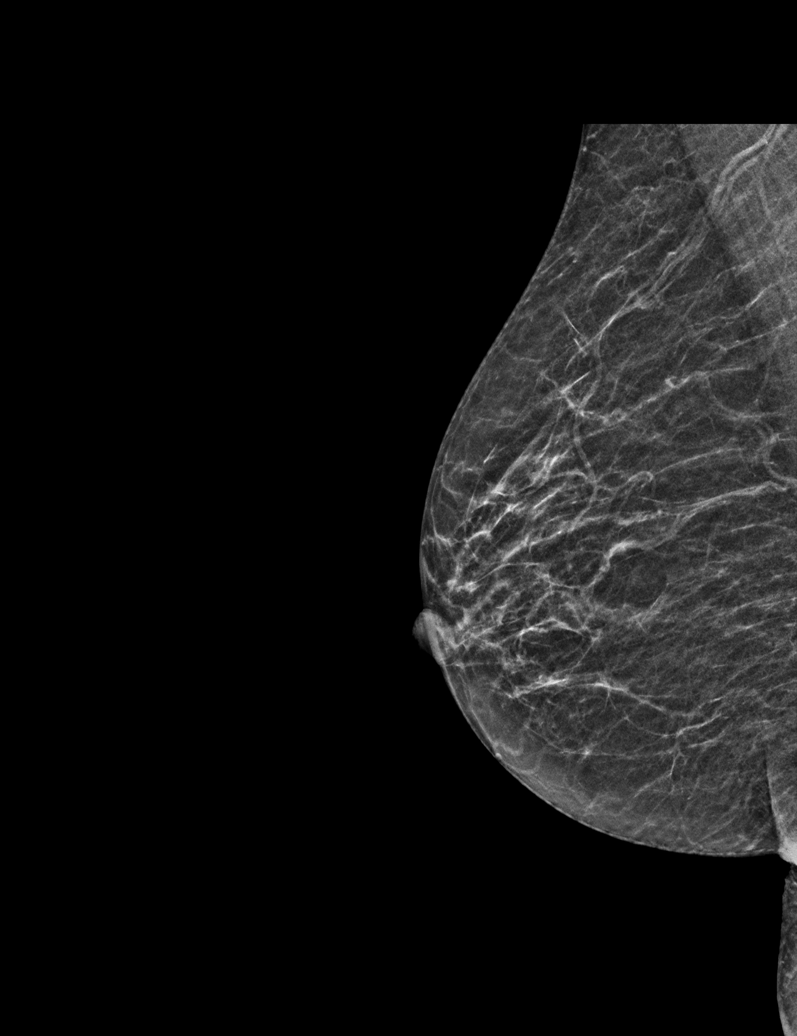

[L CC synth-2D]
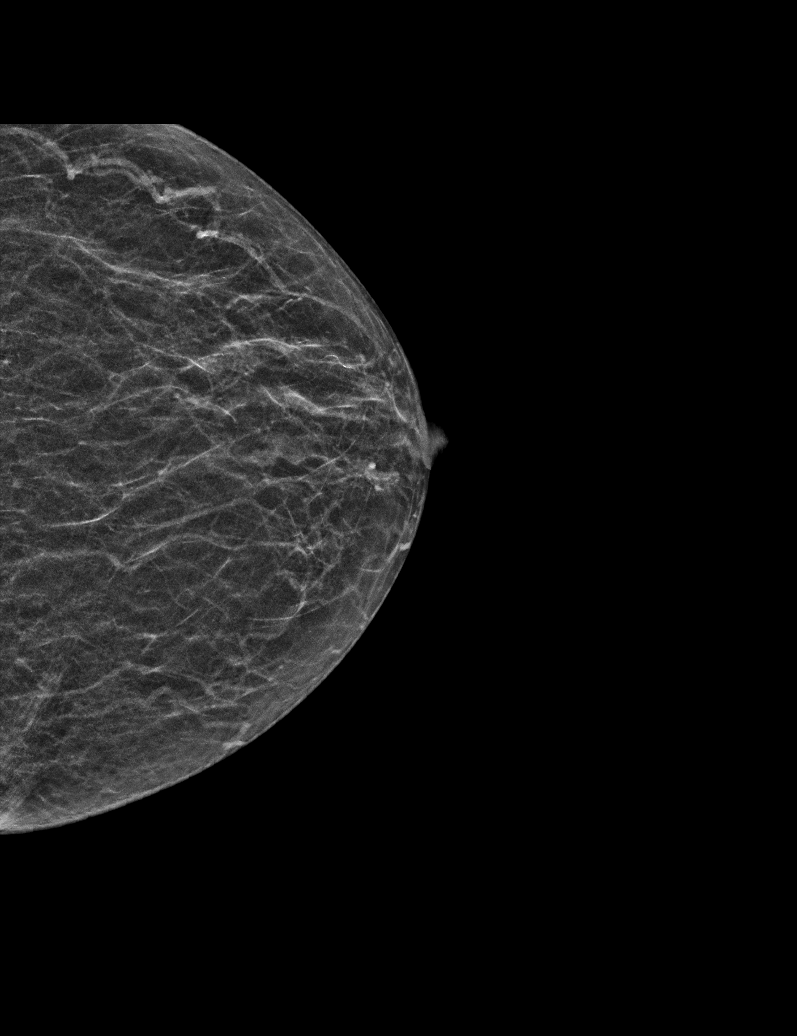

[R CC tomo · tomo slice 17/32.0]
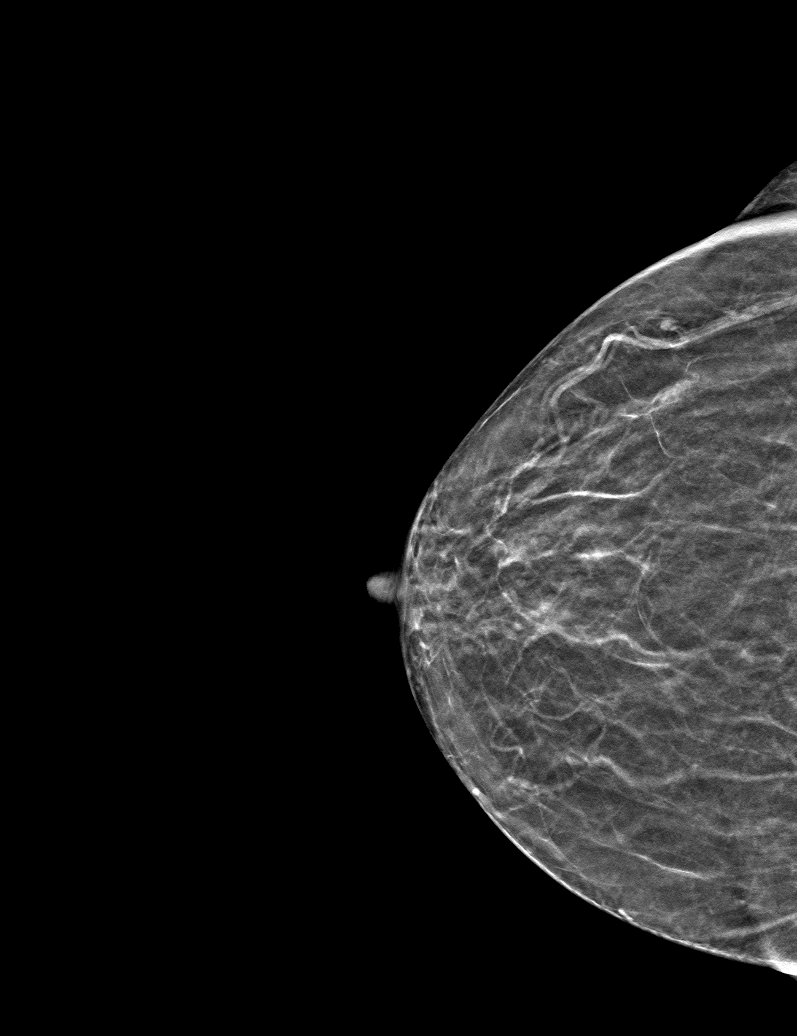

[6 of 30 positions shown; findings below may reference images not displayed]

ACR Breast Density Category b: There are scattered areas of
fibroglandular density.
FINDINGS: There are no findings suspicious for malignancy. Images were
processed with CAD.
IMPRESSION: No mammographic evidence of malignancy. A result letter of this
screening mammogram will be mailed directly to the patient.

RECOMMENDATION:
Screening mammogram in one year. (Code:[id number])

BI-RADS CATEGORY  1: Negative.

## 2021-05-31 ENCOUNTER — Other Ambulatory Visit: Payer: Self-pay | Admitting: Family Medicine

## 2021-05-31 DIAGNOSIS — E782 Mixed hyperlipidemia: Secondary | ICD-10-CM

## 2021-06-22 ENCOUNTER — Other Ambulatory Visit: Payer: Self-pay

## 2021-06-22 ENCOUNTER — Ambulatory Visit: Payer: Medicare PPO | Admitting: Family Medicine

## 2021-06-22 ENCOUNTER — Encounter: Payer: Self-pay | Admitting: Family Medicine

## 2021-06-22 VITALS — BP 129/58 | HR 94 | Temp 97.9°F | Ht 58.5 in | Wt 116.8 lb

## 2021-06-22 DIAGNOSIS — J4520 Mild intermittent asthma, uncomplicated: Secondary | ICD-10-CM

## 2021-06-22 DIAGNOSIS — I10 Essential (primary) hypertension: Secondary | ICD-10-CM | POA: Diagnosis not present

## 2021-06-22 DIAGNOSIS — Z862 Personal history of diseases of the blood and blood-forming organs and certain disorders involving the immune mechanism: Secondary | ICD-10-CM | POA: Diagnosis not present

## 2021-06-22 DIAGNOSIS — E785 Hyperlipidemia, unspecified: Secondary | ICD-10-CM

## 2021-06-22 LAB — POCT URINALYSIS DIP (CLINITEK)
Bilirubin, UA: NEGATIVE
Glucose, UA: NEGATIVE mg/dL
Ketones, POC UA: NEGATIVE mg/dL
Leukocytes, UA: NEGATIVE
Nitrite, UA: NEGATIVE
POC PROTEIN,UA: NEGATIVE
Spec Grav, UA: 1.015 (ref 1.010–1.025)
Urobilinogen, UA: 0.2 E.U./dL
pH, UA: 5.5 (ref 5.0–8.0)

## 2021-06-22 NOTE — Progress Notes (Signed)
[facility]   Established Patient Office Visit  Subjective:  Patient ID: Sarah Valenzuela, female    DOB: 08-May-1932  Age: 87 y.o. MRN: [id number]  CC:  Chief Complaint  Patient presents with   Follow-up    Pt is here today for her 3 month follow up visit. No concerns or issues.    HPI [name] is a very pleasant 87 year old female with a medical history significant for essential hypertension, hyperlipidemia, mild intermittent asthma, and history of osteoporosis that presents for follow-up of chronic disease.  Patient states that she has been doing very well and does not have any complaints on today.  She is taking all of her medications consistently.  Patient typically follows a low-fat, low carbohydrate diet.  She stays active, especially with her church.  She states that she has not gone back to the [facility] due to COVID-19 and flu season.  Patient has a history of mild intermittent asthma, she has not had any asthma exacerbation over the past years.  Patient has had a relatively poor appetite, she continues to drink Ensure between meals. She currently denies any headaches, chest pain, lower extremity swelling, shortness of breath, nausea, vomiting, or diarrhea.  Past Medical History:  Diagnosis Date   Asthma    Hypercholesteremia    Hyperlipidemia    Hypertension    Osteoporosis 02/2017   T score -3.1   Situational stress     History reviewed. No pertinent surgical history.  Family History  Problem Relation Age of Onset   Cancer Brother        Prostate   Breast cancer Sister        UNSURE   Diabetes Sister    Heart disease Sister     Social History   Socioeconomic History   Marital status: Widowed    Spouse name: Not on file   Number of children: Not on file   Years of education: Not on file   Highest education level: Not on file  Occupational History   Not on file  Tobacco Use   Smoking status:  Never   Smokeless tobacco: Never  Vaping Use   Vaping Use: Never used  Substance and Sexual Activity   Alcohol use: No   Drug use: No   Sexual activity: Not Currently    Comment: declined insurance questions  Other Topics Concern   Not on file  Social History Narrative   Not on file   Social Determinants of Health   Financial Resource Strain: Not on file  Food Insecurity: Not on file  Transportation Needs: Not on file  Physical Activity: Not on file  Stress: Not on file  Social Connections: Not on file  Intimate Partner Violence: Not on file    Outpatient Medications Prior to Visit  Medication Sig Dispense Refill   acetaminophen (TYLENOL) 500 MG tablet Take 1 tablet (500 mg total) by mouth every 6 (six) hours as needed for pain. 30 tablet 0   albuterol (VENTOLIN HFA) 108 (90 Base) MCG/ACT inhaler Inhale 2 puffs into the lungs every 6 (six) hours as needed for wheezing or shortness of breath. 8 g 1   aspirin 81 MG chewable tablet Chew 81 mg by mouth daily.     calcium citrate (CALCITRATE - DOSED IN MG ELEMENTAL CALCIUM) 950 MG tablet Take 1 tablet by mouth 2 (two) times daily.     cetirizine (ZYRTEC) 10 MG tablet Take 1 tablet (10  mg total) by mouth daily. 90 tablet 1   Cholecalciferol (VITAMIN D3) 125 MCG (5000 UT) CAPS Take 1 capsule by mouth daily.     clobetasol ointment (TEMOVATE) 0.05 % APPLY TWICE A WEEK, LONG TERM. 30 g 4   cycloSPORINE (RESTASIS) 0.05 % ophthalmic emulsion Place 1 drop into both eyes 2 (two) times daily.     feeding supplement, ENSURE COMPLETE, (ENSURE COMPLETE) LIQD Take 237 mLs by mouth 2 (two) times daily between meals. Two bottles daily 13272 mL 12   fluticasone (FLONASE) 50 MCG/ACT nasal spray USE 2 SPRAYS IN EACH NOSTRIL ONCE A DAY. 16 g 5   fluticasone-salmeterol (ADVAIR) 100-50 MCG/ACT AEPB INHALE 1 PUFF TWICE DAILY 60 each 5   rosuvastatin (CRESTOR) 5 MG tablet TAKE 1 TABLET ONCE DAILY. 90 tablet 1   SENNOSIDES PO Take 8.5 mg by mouth as needed  (Swiss Kriss OTC Laxative).     triamterene-hydrochlorothiazide (MAXZIDE-25) 37.5-25 MG tablet TAKE (1/2) TABLET DAILY. 45 tablet 5   zafirlukast (ACCOLATE) 20 MG tablet TAKE 1 TABLET BY MOUTH TWICE DAILY. 180 tablet 5   No facility-administered medications prior to visit.    Allergies  Allergen Reactions   Fish Allergy    Penicillins Other (See Comments)    Breaks out. Longtime allergy    ROS Review of Systems  Constitutional: Negative.   HENT: Negative.    Eyes: Negative.   Respiratory: Negative.    Cardiovascular: Negative.   Gastrointestinal: Negative.   Genitourinary: Negative.   Musculoskeletal: Negative.   Skin: Negative.   Psychiatric/Behavioral: Negative.       Objective:    Physical Exam Constitutional:      Appearance: Normal appearance.  HENT:     Mouth/Throat:     Pharynx: Oropharynx is clear.  Eyes:     Pupils: Pupils are equal, round, and reactive to light.  Cardiovascular:     Rate and Rhythm: Normal rate and regular rhythm.     Pulses: Normal pulses.  Pulmonary:     Effort: Pulmonary effort is normal.  Abdominal:     General: Bowel sounds are normal.  Musculoskeletal:        General: Normal range of motion.  Skin:    General: Skin is warm.  Neurological:     General: No focal deficit present.     Mental Status: She is alert. Mental status is at baseline.  Psychiatric:        Mood and Affect: Mood normal.        Thought Content: Thought content normal.        Judgment: Judgment normal.    BP (!) 129/58    Pulse 94    Temp 97.9 F (36.6 C)    Ht 4' 10.5" (1.486 m)    Wt 116 lb 12.8 oz (53 kg)    SpO2 100%    BMI 24.00 kg/m  Wt Readings from Last 3 Encounters:  06/22/21 116 lb 12.8 oz (53 kg)  03/23/21 120 lb 9.6 oz (54.7 kg)  12/08/20 119 lb 0.4 oz (54 kg)     Health Maintenance Due  Topic Date Due   Zoster Vaccines- Shingrix (1 of 2) Never done   Pneumonia Vaccine 64+ Years old (2 - PPSV23 if available, else PCV20) 08/21/2015    COVID-19 Vaccine (5 - Booster for Pfizer series) 05/06/2021    There are no preventive care reminders to display for this patient.  Lab Results  Component Value Date   TSH 1.75 10/17/2012  Lab Results  Component Value Date   WBC 7.0 02/07/2019   HGB 12.1 02/07/2019   HCT 36.7 02/07/2019   MCV 94 02/07/2019   PLT 218 02/07/2019   Lab Results  Component Value Date   NA 131 (L) 03/23/2021   K 4.0 03/23/2021   CO2 25 03/23/2021   GLUCOSE 79 03/23/2021   BUN 8 03/23/2021   CREATININE 0.77 03/23/2021   BILITOT 0.4 03/23/2021   ALKPHOS 63 03/23/2021   AST 27 03/23/2021   ALT 13 03/23/2021   PROT 7.4 03/23/2021   ALBUMIN 4.8 (H) 03/23/2021   CALCIUM 9.7 03/23/2021   EGFR 75 03/23/2021   Lab Results  Component Value Date   CHOL 177 12/08/2020   Lab Results  Component Value Date   HDL 98 12/08/2020   Lab Results  Component Value Date   LDLCALC 68 12/08/2020   Lab Results  Component Value Date   TRIG 55 12/08/2020   Lab Results  Component Value Date   CHOLHDL 1.8 12/08/2020   Lab Results  Component Value Date   HGBA1C 5.1 04/22/2016      Assessment & Plan:   Problem List Items Addressed This Visit       Respiratory   Asthma     Other   Hyperlipidemia (Chronic)   Other Visit Diagnoses     Essential hypertension    -  Primary      1. Essential hypertension BP (!) 129/58    Pulse 94    Temp 97.9 F (36.6 C)    Ht 4' 10.5" (1.486 m)    Wt 116 lb 12.8 oz (53 kg)    SpO2 100%    BMI 24.00 kg/m  - Continue medication, monitor blood pressure at home. Continue DASH diet.  Reminder to go to the ER if any CP, SOB, nausea, dizziness, severe HA, changes vision/speech, left arm numbness and tingling and jaw pain.   - Basic Metabolic Panel - POCT URINALYSIS DIP (CLINITEK)  2. Hyperlipidemia, unspecified hyperlipidemia type Reviewed most recent lipid panel, all within normal limits.  3. Mild intermittent asthma without complication No asthma  exacerbation over the past several years.  We will continue current medication regimen.  4. History of anemia  - Anemia panel   Follow-up: Return in about 3 months (around 09/20/2021) for hypertension.    [name]  APRN, MSN, FNP-C [facility] [address] [place] [address], [place] [address] [phone]

## 2021-06-22 NOTE — Patient Instructions (Signed)

## 2021-06-23 LAB — ANEMIA PANEL
Ferritin: 66 ng/mL (ref 15–150)
Hematocrit: 35.8 % (ref 34.0–46.6)
Iron Saturation: 31 % (ref 15–55)
Iron: 100 ug/dL (ref 27–139)
Retic Ct Pct: 1 % (ref 0.6–2.6)
Total Iron Binding Capacity: 319 ug/dL (ref 250–450)
UIBC: 219 ug/dL (ref 118–369)
Vitamin B-12: >2000 pg/mL — ABNORMAL HIGH (ref 232–1245)

## 2021-06-23 LAB — BASIC METABOLIC PANEL
BUN/Creatinine Ratio: 21 (ref 12–28)
BUN: 14 mg/dL (ref 8–27)
CO2: 23 mmol/L (ref 20–29)
Calcium: 9 mg/dL (ref 8.7–10.3)
Chloride: 98 mmol/L (ref 96–106)
Creatinine, Ser: 0.68 mg/dL (ref 0.57–1.00)
Glucose: 109 mg/dL — ABNORMAL HIGH (ref 70–99)
Potassium: 3.8 mmol/L (ref 3.5–5.2)
Sodium: 134 mmol/L (ref 134–144)
eGFR: 84 mL/min/{1.73_m2} (ref >59)

## 2021-06-29 ENCOUNTER — Telehealth: Payer: Self-pay | Admitting: Family Medicine

## 2021-06-29 NOTE — Telephone Encounter (Signed)
Please inform patient that vitamin B12 is abnormally elevated. Refrain from any OTC vitamins or supplements without discussing with PCP. Will repeat level in 3 months. All other laboratory values are within normal limits.    Nolon Nations  APRN, MSN, FNP-C [facility] [address] [place], [place] [address] [phone]

## 2021-08-27 ENCOUNTER — Other Ambulatory Visit: Payer: Self-pay | Admitting: Family Medicine

## 2021-08-27 DIAGNOSIS — J4520 Mild intermittent asthma, uncomplicated: Secondary | ICD-10-CM

## 2021-09-21 ENCOUNTER — Ambulatory Visit: Payer: Medicare PPO | Admitting: Family Medicine

## 2021-09-28 ENCOUNTER — Ambulatory Visit (INDEPENDENT_AMBULATORY_CARE_PROVIDER_SITE_OTHER): Payer: Medicare PPO | Admitting: Family Medicine

## 2021-09-28 VITALS — BP 137/68 | HR 76 | Temp 97.9°F | Ht 58.5 in | Wt 114.2 lb

## 2021-09-28 DIAGNOSIS — E785 Hyperlipidemia, unspecified: Secondary | ICD-10-CM

## 2021-09-28 DIAGNOSIS — J4520 Mild intermittent asthma, uncomplicated: Secondary | ICD-10-CM

## 2021-09-28 DIAGNOSIS — I10 Essential (primary) hypertension: Secondary | ICD-10-CM | POA: Diagnosis not present

## 2021-09-28 DIAGNOSIS — Z23 Encounter for immunization: Secondary | ICD-10-CM

## 2021-09-28 LAB — POCT URINALYSIS DIPSTICK
Bilirubin, UA: NEGATIVE
Blood, UA: NEGATIVE
Glucose, UA: NEGATIVE
Ketones, UA: NEGATIVE
Nitrite, UA: NEGATIVE
Protein, UA: NEGATIVE
Spec Grav, UA: 1.015 (ref 1.010–1.025)
Urobilinogen, UA: 0.2 E.U./dL
pH, UA: 7.5 (ref 5.0–8.0)

## 2021-09-28 NOTE — Progress Notes (Signed)
?Patient [facility]  ? ?Established Patient Office Visit ? ?Subjective:  ?Patient ID: Sarah Valenzuela, female    DOB: [birth year]  Age: 88 y.o. MRN: [id number] ? ?CC:  ?Chief Complaint  ?Patient presents with  ? Follow-up  ?  Pt is here for 3 month follow up.  ? ? ?HPI ?Sarah Valenzuela is a very pleasant 88 year old female with a medical history significant for essential hypertension, hyperlipidemia, and well-controlled mild intermittent asthma presents for follow-up of chronic conditions.  Patient states that she is doing very well and does not have any complaints on today.  She recently started an exercise routine and has some classes scheduled with her church.  She completed a fall prevention class last month.  She is not sustained any falls recently.  Patient does not check blood pressure at home.  She typically follows a low-fat diet divided over small meals.  Denies any blurry vision, headaches, nausea, vomiting, or diarrhea.  No lower extremity swelling, shortness of breath, or heart palpitations. ? ?Past Medical History:  ?Diagnosis Date  ? Asthma   ? Hypercholesteremia   ? Hyperlipidemia   ? Hypertension   ? Osteoporosis 02/2017  ? T score -3.1  ? Situational stress   ? ? ?No past surgical history on file. ? ?Family History  ?Problem Relation Age of Onset  ? Cancer Brother   ?     Prostate  ? Breast cancer Sister   ?     UNSURE  ? Diabetes Sister   ? Heart disease Sister   ? ? ?Social History  ? ?Socioeconomic History  ? Marital status: Widowed  ?  Spouse name: Not on file  ? Number of children: Not on file  ? Years of education: Not on file  ? Highest education level: Not on file  ?Occupational History  ? Not on file  ?Tobacco Use  ? Smoking status: Never  ? Smokeless tobacco: Never  ?Vaping Use  ? Vaping Use: Never used  ?Substance and Sexual Activity  ? Alcohol use: No  ? Drug use: No  ? Sexual activity: Not Currently  ?  Comment: declined insurance  questions  ?Other Topics Concern  ? Not on file  ?Social History Narrative  ? Not on file  ? ?Social Determinants of Health  ? ?Financial Resource Strain: Not on file  ?Food Insecurity: Not on file  ?Transportation Needs: Not on file  ?Physical Activity: Not on file  ?Stress: Not on file  ?Social Connections: Not on file  ?Intimate Partner Violence: Not on file  ? ? ?Outpatient Medications Prior to Visit  ?Medication Sig Dispense Refill  ? acetaminophen (TYLENOL) 500 MG tablet Take 1 tablet (500 mg total) by mouth every 6 (six) hours as needed for pain. 30 tablet 0  ? albuterol (VENTOLIN HFA) 108 (90 Base) MCG/ACT inhaler Inhale 2 puffs into the lungs every 6 (six) hours as needed for wheezing or shortness of breath. 8.5 g 1  ? aspirin 81 MG chewable tablet Chew 81 mg by mouth daily.    ? calcium citrate (CALCITRATE - DOSED IN MG ELEMENTAL CALCIUM) 950 MG tablet Take 1 tablet by mouth 2 (two) times daily.    ? cetirizine (ZYRTEC) 10 MG tablet Take 1 tablet (10 mg total) by mouth daily. 90 tablet 1  ? Cholecalciferol (VITAMIN D3) 125 MCG (5000 UT) CAPS Take 1 capsule by mouth daily.    ? clobetasol ointment (TEMOVATE) 0.05 %  APPLY TWICE A WEEK, LONG TERM. 30 g 4  ? cycloSPORINE (RESTASIS) 0.05 % ophthalmic emulsion Place 1 drop into both eyes 2 (two) times daily.    ? feeding supplement, ENSURE COMPLETE, (ENSURE COMPLETE) LIQD Take 237 mLs by mouth 2 (two) times daily between meals. Two bottles daily 13272 mL 12  ? fluticasone (FLONASE) 50 MCG/ACT nasal spray USE 2 SPRAYS IN EACH NOSTRIL ONCE A DAY. 16 g 5  ? fluticasone-salmeterol (ADVAIR) 100-50 MCG/ACT AEPB INHALE 1 PUFF TWICE DAILY 60 each 1  ? rosuvastatin (CRESTOR) 5 MG tablet TAKE 1 TABLET ONCE DAILY. 90 tablet 1  ? SENNOSIDES PO Take 8.5 mg by mouth as needed (Swiss Kriss OTC Laxative).    ? triamterene-hydrochlorothiazide (MAXZIDE-25) 37.5-25 MG tablet TAKE (1/2) TABLET DAILY. 45 tablet 5  ? zafirlukast (ACCOLATE) 20 MG tablet TAKE 1 TABLET BY MOUTH TWICE  DAILY. 180 tablet 5  ? ?No facility-administered medications prior to visit.  ? ? ?Allergies  ?Allergen Reactions  ? Fish Allergy   ? Penicillins Other (See Comments)  ?  Breaks out. Longtime allergy  ? ?Immunization History  ?Administered Date(s) Administered  ? Influenza Whole 04/17/2012  ? Influenza,inj,Quad PF,6+ Mos 04/01/2013, 03/24/2014, 04/22/2016, 04/02/2018, 03/05/2019, 03/23/2021  ? Influenza-Unspecified 04/19/2015, 04/07/2017  ? PFIZER(Purple Top)SARS-COV-2 Vaccination 07/12/2019, 07/31/2019, 03/27/2020, 03/11/2021  ? Pension scheme manager 33yr & up 03/11/2021  ? Pneumococcal Conjugate-13 08/20/2014  ? Tdap 08/21/2013  ?  ?ROS ?Review of Systems  ?Constitutional: Negative.   ?HENT: Negative.    ?Respiratory: Negative.    ?Cardiovascular: Negative.   ?Gastrointestinal: Negative.   ?Genitourinary: Negative.   ?Musculoskeletal: Negative.   ? ?  ?Objective:  ?  ?Physical Exam ?Constitutional:   ?   Appearance: Normal appearance.  ?HENT:  ?   Mouth/Throat:  ?   Mouth: Mucous membranes are moist.  ?   Pharynx: Oropharynx is clear.  ?Eyes:  ?   Pupils: Pupils are equal, round, and reactive to light.  ?Cardiovascular:  ?   Rate and Rhythm: Normal rate.  ?   Pulses: Normal pulses.  ?   Heart sounds: Normal heart sounds.  ?Pulmonary:  ?   Effort: Pulmonary effort is normal.  ?Abdominal:  ?   General: Bowel sounds are normal.  ?Musculoskeletal:     ?   General: Normal range of motion.  ?Skin: ?   General: Skin is warm.  ?Neurological:  ?   General: No focal deficit present.  ?   Mental Status: She is alert. Mental status is at baseline.  ?Psychiatric:     ?   Mood and Affect: Mood normal.     ?   Behavior: Behavior normal.     ?   Thought Content: Thought content normal.     ?   Judgment: Judgment normal.  ? ? ?BP (!) 150/65 (BP Location: Left Arm, Patient Position: Sitting, Cuff Size: Normal)   Pulse 82   Temp 97.9 ?F (36.6 ?C)   Ht 4' 10.5" (1.486 m)   Wt 114 lb 4 oz (51.8 kg)   SpO2 100%    BMI 23.47 kg/m?  ?Wt Readings from Last 3 Encounters:  ?09/28/21 114 lb 4 oz (51.8 kg)  ?06/22/21 116 lb 12.8 oz (53 kg)  ?03/23/21 120 lb 9.6 oz (54.7 kg)  ? ? ? ?Health Maintenance Due  ?Topic Date Due  ? Zoster Vaccines- Shingrix (1 of 2) Never done  ? Pneumonia Vaccine 86 Years old (2 - PPSV23 if available,  else PCV20) 08/21/2015  ? COVID-19 Vaccine (5 - Booster for Pfizer series) 05/06/2021  ? ? ?There are no preventive care reminders to display for this patient. ? ?Lab Results  ?Component Value Date  ? TSH 1.75 10/17/2012  ? ?Lab Results  ?Component Value Date  ? WBC 7.0 02/07/2019  ? HGB 12.1 02/07/2019  ? HCT 35.8 06/22/2021  ? MCV 94 02/07/2019  ? PLT 218 02/07/2019  ? ?Lab Results  ?Component Value Date  ? NA 134 06/22/2021  ? K 3.8 06/22/2021  ? CO2 23 06/22/2021  ? GLUCOSE 109 (H) 06/22/2021  ? BUN 14 06/22/2021  ? CREATININE 0.68 06/22/2021  ? BILITOT 0.4 03/23/2021  ? ALKPHOS 63 03/23/2021  ? AST 27 03/23/2021  ? ALT 13 03/23/2021  ? PROT 7.4 03/23/2021  ? ALBUMIN 4.8 (H) 03/23/2021  ? CALCIUM 9.0 06/22/2021  ? EGFR 84 06/22/2021  ? ?Lab Results  ?Component Value Date  ? CHOL 177 12/08/2020  ? ?Lab Results  ?Component Value Date  ? HDL 98 12/08/2020  ? ?Lab Results  ?Component Value Date  ? [place] 68 12/08/2020  ? ?Lab Results  ?Component Value Date  ? TRIG 55 12/08/2020  ? ?Lab Results  ?Component Value Date  ? CHOLHDL 1.8 12/08/2020  ? ?Lab Results  ?Component Value Date  ? HGBA1C 5.1 04/22/2016  ? ? ?  ?Assessment & Plan:  ? ?Problem List Items Addressed This Visit   ? ?  ? Respiratory  ? Asthma  ?  ? Other  ? Hyperlipidemia (Chronic)  ? ?Other Visit Diagnoses   ? ? Essential hypertension    -  Primary  ? Relevant Orders  ? Urinalysis Dipstick (Completed)  ? ?  ?1. Essential hypertension ?Continue medication, monitor blood pressure at home. Continue DASH diet.  Reminder to go to the ER if any CP, SOB, nausea, dizziness, severe HA, changes vision/speech, left arm numbness and tingling and jaw  pain. ?- Urinalysis Dipstick ? ?2. Hyperlipidemia, unspecified hyperlipidemia type ?Reviewed previous lipid panels, all within normal range.  Patient advised to follow-up with her cardiologist once a year. ? ?3.

## 2021-09-28 NOTE — Patient Instructions (Signed)
Recommend that patient receives COVID-19 booster #5 ? ?Continue medication, monitor blood pressure at home. Continue DASH diet.  Reminder to go to the ER if any CP, SOB, nausea, dizziness, severe HA, changes vision/speech, left arm numbness and tingling and jaw pain. ? ? ?

## 2021-09-29 LAB — BASIC METABOLIC PANEL
BUN/Creatinine Ratio: 20 (ref 12–28)
BUN: 13 mg/dL (ref 8–27)
CO2: 23 mmol/L (ref 20–29)
Calcium: 9.7 mg/dL (ref 8.7–10.3)
Chloride: 93 mmol/L — ABNORMAL LOW (ref 96–106)
Creatinine, Ser: 0.66 mg/dL (ref 0.57–1.00)
Glucose: 93 mg/dL (ref 70–99)
Potassium: 3.7 mmol/L (ref 3.5–5.2)
Sodium: 131 mmol/L — ABNORMAL LOW (ref 134–144)
eGFR: 85 mL/min/{1.73_m2} (ref >59)

## 2021-09-30 NOTE — Progress Notes (Signed)
Reviewed laboratory values, all within normal limits. No medication changes warranted. Please follow up in 3 months as scheduled.  ? ?Nolon Nations  APRN, MSN, FNP-C ?[facility] ?[address]  ?[place], [place] [address] ?[phone] ? ?

## 2021-10-26 ENCOUNTER — Other Ambulatory Visit: Payer: Self-pay | Admitting: Family Medicine

## 2021-10-26 DIAGNOSIS — J4520 Mild intermittent asthma, uncomplicated: Secondary | ICD-10-CM

## 2021-10-26 DIAGNOSIS — J302 Other seasonal allergic rhinitis: Secondary | ICD-10-CM

## 2021-11-27 ENCOUNTER — Other Ambulatory Visit: Payer: Self-pay | Admitting: Family Medicine

## 2021-11-27 DIAGNOSIS — E782 Mixed hyperlipidemia: Secondary | ICD-10-CM

## 2021-12-02 DIAGNOSIS — E785 Hyperlipidemia, unspecified: Secondary | ICD-10-CM | POA: Diagnosis not present

## 2021-12-02 DIAGNOSIS — I340 Nonrheumatic mitral (valve) insufficiency: Secondary | ICD-10-CM | POA: Diagnosis not present

## 2021-12-02 DIAGNOSIS — I10 Essential (primary) hypertension: Secondary | ICD-10-CM | POA: Diagnosis not present

## 2021-12-02 DIAGNOSIS — I2510 Atherosclerotic heart disease of native coronary artery without angina pectoris: Secondary | ICD-10-CM | POA: Diagnosis not present

## 2021-12-23 ENCOUNTER — Other Ambulatory Visit: Payer: Self-pay | Admitting: Nurse Practitioner

## 2021-12-23 ENCOUNTER — Other Ambulatory Visit: Payer: Self-pay | Admitting: Obstetrics & Gynecology

## 2021-12-23 DIAGNOSIS — J302 Other seasonal allergic rhinitis: Secondary | ICD-10-CM

## 2021-12-23 DIAGNOSIS — J4520 Mild intermittent asthma, uncomplicated: Secondary | ICD-10-CM

## 2021-12-24 NOTE — Telephone Encounter (Signed)
Last annual exam was 08/2020 " Lichen Sclerosus, doing very well on Clobetasol ointment twice a week."   No future exam scheduled yet.  She is 88 years old does she breast and pelvic this year?

## 2022-01-04 ENCOUNTER — Encounter: Payer: Self-pay | Admitting: Family Medicine

## 2022-01-04 ENCOUNTER — Ambulatory Visit: Payer: Medicare PPO | Admitting: Family Medicine

## 2022-01-04 VITALS — BP 133/58 | HR 78 | Temp 98.2°F | Ht 58.5 in | Wt 114.0 lb

## 2022-01-04 DIAGNOSIS — J4520 Mild intermittent asthma, uncomplicated: Secondary | ICD-10-CM

## 2022-01-04 DIAGNOSIS — I10 Essential (primary) hypertension: Secondary | ICD-10-CM

## 2022-01-04 DIAGNOSIS — E785 Hyperlipidemia, unspecified: Secondary | ICD-10-CM | POA: Diagnosis not present

## 2022-01-04 NOTE — Progress Notes (Signed)
[facility]    Subjective   Patient ID: Sarah Valenzuela, female    DOB: 01-Jul-1931  Age: 88 y.o. MRN: [id number]  Chief Complaint  Patient presents with  . Follow-up  . Hypertension    Sarah Valenzuela is a very pleasant 88 year old female with a medical history significant for essential hypertension, hyperlipidemia, and mild intermittent asthma that presents for follow-up of her chronic conditions. Patient has been doing very well and is without complaint on today. She continues to exercise with her church weekly.  Patient typically eats a very balanced diet and drinks water.  She is up-to-date with yearly eye exam and goes to dentist twice yearly.  Patient denies any headache, chest pain, dizziness, urinary symptoms, nausea, vomiting, or diarrhea today.  Hypertension This is a chronic problem. The problem is controlled. Pertinent negatives include no blurred vision, chest pain, headaches, neck pain, orthopnea, palpitations, PND, shortness of breath or sweats. There are no compliance problems.     Patient Active Problem List   Diagnosis Date Noted  . Asthma 05/04/2015  . Medicare annual wellness visit, subsequent 08/20/2014  . Vaginal dryness 06/02/2014  . Situational depression 05/30/2014  . Anxiety state 05/30/2014  . Vaginal itching 11/14/2012  . Occasional tremors 11/14/2012  . HTN (hypertension) 11/14/2012  . Hyperlipidemia 11/14/2012  . Adjustment disorder with mixed anxiety and depressed mood 11/14/2012  . Seasonal allergies 11/14/2012   Past Medical History:  Diagnosis Date  . Asthma   . Hypercholesteremia   . Hyperlipidemia   . Hypertension   . Osteoporosis 02/2017   T score -3.1  . Situational stress    History reviewed. No pertinent surgical history. Social History   Tobacco Use  . Smoking status: Never  . Smokeless tobacco: Never  Vaping Use  . Vaping Use: Never used  Substance Use Topics  . Alcohol  use: No  . Drug use: No   Social History   Socioeconomic History  . Marital status: Widowed    Spouse name: Not on file  . Number of children: Not on file  . Years of education: Not on file  . Highest education level: Not on file  Occupational History  . Not on file  Tobacco Use  . Smoking status: Never  . Smokeless tobacco: Never  Vaping Use  . Vaping Use: Never used  Substance and Sexual Activity  . Alcohol use: No  . Drug use: No  . Sexual activity: Not Currently    Comment: declined insurance questions  Other Topics Concern  . Not on file  Social History Narrative  . Not on file   Social Determinants of Health   Financial Resource Strain: Not on file  Food Insecurity: Not on file  Transportation Needs: Not on file  Physical Activity: Not on file  Stress: Not on file  Social Connections: Not on file  Intimate Partner Violence: Not on file   Family Status  Relation Name Status  . Brother  (Not Specified)  . Sister  (Not Specified)  . Sister  (Not Specified)   Family History  Problem Relation Age of Onset  . Cancer Brother        Prostate  . Breast cancer Sister        UNSURE  . Diabetes Sister   . Heart disease Sister    Allergies  Allergen Reactions  . Fish Allergy   . Penicillins Other (See Comments)    Breaks out. Longtime allergy  Review of Systems  Constitutional: Negative.   HENT: Negative.    Eyes: Negative.  Negative for blurred vision.  Respiratory: Negative.  Negative for shortness of breath.   Cardiovascular: Negative.  Negative for chest pain, palpitations, orthopnea and PND.  Gastrointestinal: Negative.   Genitourinary: Negative.   Musculoskeletal: Negative.  Negative for neck pain.  Skin: Negative.   Neurological: Negative.  Negative for headaches.  Psychiatric/Behavioral: Negative.        Objective:     BP (!) 133/58   Pulse 78   Temp 98.2 F (36.8 C)   Wt 114 lb (51.7 kg)   BMI 23.42 kg/m  BP Readings from  Last 3 Encounters:  01/04/22 (!) 133/58  09/28/21 137/68  06/22/21 (!) 129/58   Wt Readings from Last 3 Encounters:  01/04/22 114 lb (51.7 kg)  09/28/21 114 lb 4 oz (51.8 kg)  06/22/21 116 lb 12.8 oz (53 kg)     Physical Exam Eyes:     Pupils: Pupils are equal, round, and reactive to light.  Cardiovascular:     Rate and Rhythm: Normal rate and regular rhythm.     Pulses: Normal pulses.  Pulmonary:     Effort: Pulmonary effort is normal.  Abdominal:     General: Bowel sounds are normal.  Skin:    General: Skin is warm.  Neurological:     General: No focal deficit present.     Mental Status: Mental status is at baseline.  Psychiatric:        Mood and Affect: Mood normal.        Behavior: Behavior normal.        Thought Content: Thought content normal.        Judgment: Judgment normal.     No results found for any visits on 01/04/22.  Last CBC Lab Results  Component Value Date   WBC 7.0 02/07/2019   HGB 12.1 02/07/2019   HCT 35.8 06/22/2021   MCV 94 02/07/2019   MCH 30.9 02/07/2019   RDW 12.3 02/07/2019   PLT 218 19/75/8832   Last metabolic panel Lab Results  Component Value Date   GLUCOSE 93 09/28/2021   NA 131 (L) 09/28/2021   K 3.7 09/28/2021   CL 93 (L) 09/28/2021   CO2 23 09/28/2021   BUN 13 09/28/2021   CREATININE 0.66 09/28/2021   EGFR 85 09/28/2021   CALCIUM 9.7 09/28/2021   PROT 7.4 03/23/2021   ALBUMIN 4.8 (H) 03/23/2021   LABGLOB 2.6 03/23/2021   AGRATIO 1.8 03/23/2021   BILITOT 0.4 03/23/2021   ALKPHOS 63 03/23/2021   AST 27 03/23/2021   ALT 13 03/23/2021   Last lipids Lab Results  Component Value Date   CHOL 177 12/08/2020   HDL 98 12/08/2020   LDLCALC 68 12/08/2020   TRIG 55 12/08/2020   CHOLHDL 1.8 12/08/2020   Last hemoglobin A1c Lab Results  Component Value Date   HGBA1C 5.1 04/22/2016   Last thyroid functions Lab Results  Component Value Date   TSH 1.75 10/17/2012   Last vitamin D Lab Results  Component Value Date    VD25OH 52 05/07/2014   Last vitamin B12 and Folate Lab Results  Component Value Date   VITAMINB12 >2000 (H) 06/22/2021   FOLATE >20.0 02/11/2013      The ASCVD Risk score (Arnett DK, et al., 2019) failed to calculate for the following reasons:   The 2019 ASCVD risk score is only valid for ages 74 to 8  Assessment & Plan:   Problem List Items Addressed This Visit       Respiratory   Asthma     Other   Hyperlipidemia (Chronic)   Other Visit Diagnoses     Essential hypertension    -  Primary   Relevant Orders   Lipid Panel   Basic Metabolic Panel   Urinalysis, Routine w reflex microscopic     1. Essential hypertension We can get your blood drawn before doing - Lipid Panel - Basic Metabolic Panel - Urinalysis, Routine w reflex microscopic  2. Hyperlipidemia, unspecified hyperlipidemia type ***  3. Mild intermittent asthma without complication ***   Return in about 3 months (around 04/06/2022).   [name]  APRN, MSN, FNP-C [facility] [address] [place], [place] [address] [phone]

## 2022-01-04 NOTE — Progress Notes (Unsigned)
Pt presents for hypertension f/u. Pt states she has been checking her BP at home and they have been normal. BP today 147/73. She also states she has also made lifestyle changes to improve BP.   BP rechecked after resting: 133/58

## 2022-01-05 LAB — URINALYSIS, ROUTINE W REFLEX MICROSCOPIC
Bilirubin, UA: NEGATIVE
Glucose, UA: NEGATIVE
Ketones, UA: NEGATIVE
Leukocytes,UA: NEGATIVE
Nitrite, UA: NEGATIVE
Protein,UA: NEGATIVE
RBC, UA: NEGATIVE
Specific Gravity, UA: 1.012 (ref 1.005–1.030)
Urobilinogen, Ur: 0.2 mg/dL (ref 0.2–1.0)
pH, UA: 7.5 (ref 5.0–7.5)

## 2022-01-05 LAB — BASIC METABOLIC PANEL
BUN/Creatinine Ratio: 19 (ref 12–28)
BUN: 13 mg/dL (ref 8–27)
CO2: 26 mmol/L (ref 20–29)
Calcium: 9.7 mg/dL (ref 8.7–10.3)
Chloride: 94 mmol/L — ABNORMAL LOW (ref 96–106)
Creatinine, Ser: 0.68 mg/dL (ref 0.57–1.00)
Glucose: 89 mg/dL (ref 70–99)
Potassium: 3.9 mmol/L (ref 3.5–5.2)
Sodium: 134 mmol/L (ref 134–144)
eGFR: 84 mL/min/{1.73_m2} (ref >59)

## 2022-01-05 LAB — LIPID PANEL
Chol/HDL Ratio: 1.6 ratio (ref 0.0–4.4)
Cholesterol, Total: 168 mg/dL (ref 100–199)
HDL: 105 mg/dL (ref >39)
LDL Chol Calc (NIH): 53 mg/dL (ref 0–99)
Triglycerides: 43 mg/dL (ref 0–149)
VLDL Cholesterol Cal: 10 mg/dL (ref 5–40)

## 2022-01-10 NOTE — Progress Notes (Signed)
Please inform patient that all laboratory values are within normal limits. We will continue to follow up every 3 months. No medication changes warranted. - Continue medication.  Continue low sodium diet. Reminder to go to the ER if any CP, SOB, nausea, dizziness, severe HA, changes vision/speech, left arm numbness and tingling and jaw pain.    Please fax results to patient's cardiologist, Dr. Sharyn Lull.   Nolon Nations  APRN, MSN, FNP-C Patient Care [facility] [address] [place], [place] [address] [phone]

## 2022-02-17 DIAGNOSIS — H35363 Drusen (degenerative) of macula, bilateral: Secondary | ICD-10-CM | POA: Diagnosis not present

## 2022-02-17 DIAGNOSIS — H40023 Open angle with borderline findings, high risk, bilateral: Secondary | ICD-10-CM | POA: Diagnosis not present

## 2022-02-17 DIAGNOSIS — H35031 Hypertensive retinopathy, right eye: Secondary | ICD-10-CM | POA: Diagnosis not present

## 2022-02-17 DIAGNOSIS — Z961 Presence of intraocular lens: Secondary | ICD-10-CM | POA: Diagnosis not present

## 2022-02-23 ENCOUNTER — Other Ambulatory Visit: Payer: Self-pay | Admitting: Family Medicine

## 2022-02-23 DIAGNOSIS — J302 Other seasonal allergic rhinitis: Secondary | ICD-10-CM

## 2022-02-23 DIAGNOSIS — J4520 Mild intermittent asthma, uncomplicated: Secondary | ICD-10-CM

## 2022-02-23 DIAGNOSIS — E782 Mixed hyperlipidemia: Secondary | ICD-10-CM

## 2022-03-25 ENCOUNTER — Other Ambulatory Visit: Payer: Self-pay | Admitting: Family Medicine

## 2022-03-25 DIAGNOSIS — J4520 Mild intermittent asthma, uncomplicated: Secondary | ICD-10-CM

## 2022-03-25 DIAGNOSIS — J302 Other seasonal allergic rhinitis: Secondary | ICD-10-CM

## 2022-04-12 ENCOUNTER — Encounter: Payer: Self-pay | Admitting: Family Medicine

## 2022-04-12 ENCOUNTER — Ambulatory Visit: Payer: Medicare PPO | Admitting: Family Medicine

## 2022-04-12 VITALS — BP 152/83 | HR 83 | Temp 97.9°F | Ht <= 58 in | Wt 111.8 lb

## 2022-04-12 DIAGNOSIS — E782 Mixed hyperlipidemia: Secondary | ICD-10-CM

## 2022-04-12 DIAGNOSIS — E538 Deficiency of other specified B group vitamins: Secondary | ICD-10-CM

## 2022-04-12 DIAGNOSIS — J4520 Mild intermittent asthma, uncomplicated: Secondary | ICD-10-CM | POA: Diagnosis not present

## 2022-04-12 DIAGNOSIS — I10 Essential (primary) hypertension: Secondary | ICD-10-CM

## 2022-04-12 DIAGNOSIS — Z23 Encounter for immunization: Secondary | ICD-10-CM | POA: Diagnosis not present

## 2022-04-12 DIAGNOSIS — E785 Hyperlipidemia, unspecified: Secondary | ICD-10-CM | POA: Diagnosis not present

## 2022-04-12 LAB — POCT URINALYSIS DIP (PROADVANTAGE DEVICE)
Bilirubin, UA: NEGATIVE
Glucose, UA: NEGATIVE mg/dL
Ketones, POC UA: NEGATIVE mg/dL
Leukocytes, UA: NEGATIVE
Nitrite, UA: NEGATIVE
Protein Ur, POC: NEGATIVE mg/dL
Specific Gravity, Urine: 1.015
Urobilinogen, Ur: 0.2
pH, UA: 8 (ref 5.0–8.0)

## 2022-04-12 NOTE — Progress Notes (Signed)
 Established Patient Office Visit  Subjective   Patient ID: Sarah Valenzuela, female    DOB: 05/13/1932  Age: 88 y.o. MRN: [id number]  Chief Complaint  Patient presents with   Follow-up    And flu shots     Ms. Tenicia Strubel is a very pleasant 88 year old female with a medical history significant for hypertension, hyperlipidemia, mild intermittent asthma, and history of situational anxiety presents for a follow up of chronic conditions. Ms. Wittwer has been doing well and is without complaint. She has been remaining active in church, living independently and driving.   Hypertension This is a chronic problem. The problem is controlled. Pertinent negatives include no anxiety, blurred vision, chest pain, headaches, malaise/fatigue, neck pain, orthopnea, palpitations, peripheral edema, PND, shortness of breath or sweats. There is no history of angina, kidney disease, CAD/MI, CVA, heart failure, left ventricular hypertrophy, PVD or retinopathy.    Patient Active Problem List   Diagnosis Date Noted   Asthma 05/04/2015   Medicare annual wellness visit, subsequent 08/20/2014   Vaginal dryness 06/02/2014   Situational depression 05/30/2014   Anxiety state 05/30/2014   Vaginal itching 11/14/2012   Occasional tremors 11/14/2012   HTN (hypertension) 11/14/2012   Hyperlipidemia 11/14/2012   Adjustment disorder with mixed anxiety and depressed mood 11/14/2012   Seasonal allergies 11/14/2012   Past Medical History:  Diagnosis Date   Asthma    Hypercholesteremia    Hyperlipidemia    Hypertension    Osteoporosis 02/2017   T score -3.1   Situational stress    No past surgical history on file. Social History   Tobacco Use   Smoking status: Never   Smokeless tobacco: Never  Vaping Use   Vaping Use: Never used  Substance Use Topics   Alcohol use: No   Drug use: No   Social History   Socioeconomic History   Marital status: Widowed    Spouse name: Not on file   Number of  children: Not on file   Years of education: Not on file   Highest education level: Not on file  Occupational History   Not on file  Tobacco Use   Smoking status: Never   Smokeless tobacco: Never  Vaping Use   Vaping Use: Never used  Substance and Sexual Activity   Alcohol use: No   Drug use: No   Sexual activity: Not Currently    Comment: declined insurance questions  Other Topics Concern   Not on file  Social History Narrative   Not on file   Social Determinants of Health   Financial Resource Strain: Not on file  Food Insecurity: Not on file  Transportation Needs: Not on file  Physical Activity: Not on file  Stress: Not on file  Social Connections: Not on file  Intimate Partner Violence: Not on file   Family Status  Relation Name Status   Brother  (Not Specified)   Sister  (Not Specified)   Sister  (Not Specified)   Family History  Problem Relation Age of Onset   Cancer Brother        Prostate   Breast cancer Sister        UNSURE   Diabetes Sister    Heart disease Sister    Allergies  Allergen Reactions   Fish Allergy    Penicillins Other (See Comments)    Breaks out. Longtime allergy      Review of Systems  Constitutional: Negative.  Negative for malaise/fatigue.  HENT: Negative.      Eyes: Negative.  Negative for blurred vision.  Respiratory: Negative.  Negative for shortness of breath.   Cardiovascular: Negative.  Negative for chest pain, palpitations, orthopnea and PND.  Gastrointestinal: Negative.   Genitourinary: Negative.   Musculoskeletal: Negative.  Negative for neck pain.  Skin: Negative.   Neurological: Negative.  Negative for headaches.  Endo/Heme/Allergies: Negative.       Objective:     BP (!) 152/83   Pulse 83   Temp 97.9 F (36.6 C)   Ht 4' 9" (1.448 m)   Wt 111 lb 12.8 oz (50.7 kg)   SpO2 98%   BMI 24.19 kg/m  BP Readings from Last 3 Encounters:  04/12/22 (!) 152/83  01/04/22 (!) 133/58  09/28/21 137/68   Wt Readings  from Last 3 Encounters:  04/12/22 111 lb 12.8 oz (50.7 kg)  01/04/22 114 lb (51.7 kg)  09/28/21 114 lb 4 oz (51.8 kg)    BP (!) 152/83   Pulse 83   Temp 97.9 F (36.6 C)   Ht 4' 9" (1.448 m)   Wt 111 lb 12.8 oz (50.7 kg)   SpO2 98%   BMI 24.19 kg/m    Physical Exam Constitutional:      Appearance: Normal appearance.  Eyes:     Pupils: Pupils are equal, round, and reactive to light.  Cardiovascular:     Rate and Rhythm: Normal rate and regular rhythm.     Pulses: Normal pulses.  Pulmonary:     Effort: Pulmonary effort is normal.  Abdominal:     General: Bowel sounds are normal.  Skin:    General: Skin is warm.  Neurological:     General: No focal deficit present.     Mental Status: She is alert. Mental status is at baseline.  Psychiatric:        Mood and Affect: Mood normal.        Behavior: Behavior normal.        Thought Content: Thought content normal.        Judgment: Judgment normal.      Results for orders placed or performed in visit on 93/26/71  Basic Metabolic Panel  Result Value Ref Range   Glucose 92 70 - 99 mg/dL   BUN 10 8 - 27 mg/dL   Creatinine, Ser 0.68 0.57 - 1.00 mg/dL   eGFR 84 >59 mL/min/1.73   BUN/Creatinine Ratio 15 12 - 28   Sodium 132 (L) 134 - 144 mmol/L   Potassium 4.0 3.5 - 5.2 mmol/L   Chloride 94 (L) 96 - 106 mmol/L   CO2 24 20 - 29 mmol/L   Calcium 9.7 8.7 - 10.3 mg/dL  Vitamin B12  Result Value Ref Range   Vitamin B-12 1,270 (H) 232 - 1,245 pg/mL  POCT Urinalysis DIP (Proadvantage Device)  Result Value Ref Range   Color, UA yellow yellow   Clarity, UA clear clear   Glucose, UA negative negative mg/dL   Bilirubin, UA negative negative   Ketones, POC UA negative negative mg/dL   Specific Gravity, Urine 1.015    Blood, UA trace-intact (A) negative   pH, UA 8.0 5.0 - 8.0   Protein Ur, POC negative negative mg/dL   Urobilinogen, Ur 0.2    Nitrite, UA Negative Negative   Leukocytes, UA Negative Negative    Last CBC Lab  Results  Component Value Date   WBC 7.0 02/07/2019   HGB 12.1 02/07/2019   HCT 35.8 06/22/2021   MCV 94 02/07/2019  MCH 30.9 02/07/2019   RDW 12.3 02/07/2019   PLT 218 42/70/6237   Last metabolic panel Lab Results  Component Value Date   GLUCOSE 92 04/12/2022   NA 132 (L) 04/12/2022   K 4.0 04/12/2022   CL 94 (L) 04/12/2022   CO2 24 04/12/2022   BUN 10 04/12/2022   CREATININE 0.68 04/12/2022   EGFR 84 04/12/2022   CALCIUM 9.7 04/12/2022   PROT 7.4 03/23/2021   ALBUMIN 4.8 (H) 03/23/2021   LABGLOB 2.6 03/23/2021   AGRATIO 1.8 03/23/2021   BILITOT 0.4 03/23/2021   ALKPHOS 63 03/23/2021   AST 27 03/23/2021   ALT 13 03/23/2021   Last lipids Lab Results  Component Value Date   CHOL 168 01/04/2022   HDL 105 01/04/2022   LDLCALC 53 01/04/2022   TRIG 43 01/04/2022   CHOLHDL 1.6 01/04/2022   Last hemoglobin A1c Lab Results  Component Value Date   HGBA1C 5.1 04/22/2016   Last thyroid functions Lab Results  Component Value Date   TSH 1.75 10/17/2012   Last vitamin D Lab Results  Component Value Date   VD25OH 52 05/07/2014   Last vitamin B12 and Folate Lab Results  Component Value Date   VITAMINB12 1,270 (H) 04/12/2022   FOLATE >20.0 02/11/2013      The ASCVD Risk score (Arnett DK, et al., 2019) failed to calculate for the following reasons:   The 2019 ASCVD risk score is only valid for ages 65 to 67    Assessment & Plan:   Problem List Items Addressed This Visit       Respiratory   Asthma     Other   Hyperlipidemia (Chronic)   Relevant Orders   Basic Metabolic Panel (Completed)   Other Visit Diagnoses     Essential hypertension    -  Primary   Relevant Orders   POCT Urinalysis DIP (Proadvantage Device) (Completed)   B12 deficiency       Relevant Orders   Vitamin B12 (Completed)   Flu vaccine need       Relevant Orders   Flu Vaccine QUAD High Dose(Fluad) (Completed)     1. Essential hypertension BP (!) 152/83   Pulse 83   Temp 97.9  F (36.6 C)   Ht 4' 9" (1.448 m)   Wt 111 lb 12.8 oz (50.7 kg)   SpO2 98%   BMI 24.19 kg/m  No orders of the defined types were placed in this encounter.   - POCT Urinalysis DIP (Proadvantage Device)  2. Hyperlipidemia, unspecified hyperlipidemia type  3. Mild intermittent asthma without complication Very well controlled. Has not had exacerbation in many years.   4. Mixed hyperlipidemia - Basic Metabolic Panel  5. B12 deficienc - Vitamin B12  6. Flu vaccine need - Flu Vaccine QUAD High Dose(Fluad)   Return in about 3 months (around 07/13/2022) for hypertension.  [name]  APRN, MSN, FNP-C Patient [place] [address] [place], [place] [address] [phone]

## 2022-04-12 NOTE — Patient Instructions (Addendum)
Patient will warrant shingles vaccination and COVID vaccine #5  Polyethylene Glycol Powder for Solution What is this medication? POLYETHYLENE GLYCOL (pol ee ETH i leen; GLYE col) prevents and treats occasional constipation. It works by increasing the amount of water your intestine absorbs. This softens the stool, making it easier to have a bowel movement. It also increases pressure, which prompts the muscles in your intestines to move stool. It belongs to a group of medications called laxatives. This medicine may be used for other purposes; ask your health care provider or pharmacist if you have questions. COMMON BRAND NAME(S): GaviLax, GIALAX, GlycoLax, Healthylax, MiraLax, Smooth LAX, Vita Health What should I tell my care team before I take this medication? They need to know if you have any of these conditions: History of blockage in your bowels Nausea Phenylketonuria Stomach or intestine problem Stomach pain Sudden change in bowel habit lasting more than 2 weeks Vomiting An unusual or allergic reaction to polyethylene glycol (PEG), other medications, foods, dyes, or preservatives Pregnant or trying to get pregnant Breast-feeding How should I use this medication? Take this medication by mouth. Take it as directed on the label. Add the right dose to 4 to 8 ounces or 120 to 240 mL of water, juice, soda, coffee or tea. Do not mix this medication with foods or other liquids. Do not combine with starch-based thickeners (e.g., flour, cornstarch, arrowroot, tapioca, xanthan gum). Mix well. Drink the solution. Do not use it more often than directed. Talk to your care team about the use of this medication in children. While it may be given to children as young as 16 years for selected conditions, precautions do apply. Overdosage: If you think you have taken too much of this medicine contact a poison control center or emergency room at once. NOTE: This medicine is only for you. Do not share this  medicine with others. What if I miss a dose? If you miss a dose, take it as soon as you can. If it is almost time for your next dose, take only that dose. Do not take double or extra doses. What may interact with this medication? Interactions are not expected. This list may not describe all possible interactions. Give your health care provider a list of all the medicines, herbs, non-prescription drugs, or dietary supplements you use. Also tell them if you smoke, drink alcohol, or use illegal drugs. Some items may interact with your medicine. What should I watch for while using this medication? Do not use for more than one week without advice from your care team. If your constipation returns, check with your care team. Drink plenty of water while taking this medication. Drinking water helps decrease constipation. Stop using this medication and contact your care team if you experience any rectal bleeding or do not have a bowel movement after use. These could be signs of a more serious condition. What side effects may I notice from receiving this medication? Side effects that you should report to your care team as soon as possible: Allergic reactions--skin rash, itching, hives, swelling of the face, lips, tongue, or throat Side effects that usually do not require medical attention (report to your care team if they continue or are bothersome): Bloating Gas Nausea Stomach cramping This list may not describe all possible side effects. Call your doctor for medical advice about side effects. You may report side effects to FDA at 1-800-FDA-1088. Where should I keep my medication? Keep out of the reach of children and pets. Store  at room temperature between 20 and 25 degrees C (68 and 77 degrees F). Get rid of any unused medication after the expiration date. To get rid of medications that are no longer needed or have expired: Take the medication to a medication take-back program. Check with your  pharmacy or law enforcement to find a location. If you cannot return the medication, check the label or package insert to see if the medication should be thrown out in the garbage or flushed down the toilet. If you are not sure, ask your care team. If it is safe to put it in the trash, pour the medication out of the container. Mix the medication with cat litter, dirt, coffee grounds, or other unwanted substance. Seal the mixture in a bag or container. Put it in the trash. NOTE: This sheet is a summary. It may not cover all possible information. If you have questions about this medicine, talk to your doctor, pharmacist, or health care provider.  2023 Elsevier/Gold Standard (2019-11-22 00:00:00) Constipation, Adult Constipation is when a person has trouble pooping (having a bowel movement). When you have this condition, you may poop fewer than 3 times a week. Your poop (stool) may also be dry, hard, or bigger than normal. Follow these instructions at home: Eating and drinking  Eat foods that have a lot of fiber, such as: Fresh fruits and vegetables. Whole grains. Beans. Eat less of foods that are low in fiber and high in fat and sugar, such as: [place] fries. Hamburgers. Cookies. Candy. Soda. Drink enough fluid to keep your pee (urine) pale yellow. General instructions Exercise regularly or as told by your doctor. Try to do 150 minutes of exercise each week. Go to the restroom when you feel like you need to poop. Do not hold it in. Take over-the-counter and prescription medicines only as told by your doctor. These include any fiber supplements. When you poop: Do deep breathing while relaxing your lower belly (abdomen). Relax your pelvic floor. The pelvic floor is a group of muscles that support the rectum, bladder, and intestines (as well as the uterus in women). Watch your condition for any changes. Tell your doctor if you notice any. Keep all follow-up visits as told by your doctor. This  is important. Contact a doctor if: You have pain that gets worse. You have a fever. You have not pooped for 4 days. You vomit. You are not hungry. You lose weight. You are bleeding from the opening of the butt (anus). You have thin, pencil-like poop. Get help right away if: You have a fever, and your symptoms suddenly get worse. You leak poop or have blood in your poop. Your belly feels hard or bigger than normal (bloated). You have very bad belly pain. You feel dizzy or you faint. Summary Constipation is when a person poops fewer than 3 times a week, has trouble pooping, or has poop that is dry, hard, or bigger than normal. Eat foods that have a lot of fiber. Drink enough fluid to keep your pee (urine) pale yellow. Take over-the-counter and prescription medicines only as told by your doctor. These include any fiber supplements. This information is not intended to replace advice given to you by your health care provider. Make sure you discuss any questions you have with your health care provider. Document Revised: 05/01/2019 Document Reviewed: 05/01/2019 Elsevier Patient Education  [employer].

## 2022-04-13 LAB — BASIC METABOLIC PANEL
BUN/Creatinine Ratio: 15 (ref 12–28)
BUN: 10 mg/dL (ref 8–27)
CO2: 24 mmol/L (ref 20–29)
Calcium: 9.7 mg/dL (ref 8.7–10.3)
Chloride: 94 mmol/L — ABNORMAL LOW (ref 96–106)
Creatinine, Ser: 0.68 mg/dL (ref 0.57–1.00)
Glucose: 92 mg/dL (ref 70–99)
Potassium: 4 mmol/L (ref 3.5–5.2)
Sodium: 132 mmol/L — ABNORMAL LOW (ref 134–144)
eGFR: 84 mL/min/{1.73_m2} (ref >59)

## 2022-04-13 LAB — VITAMIN B12: Vitamin B-12: 1270 pg/mL — ABNORMAL HIGH (ref 232–1245)

## 2022-04-13 NOTE — Progress Notes (Signed)
Sarah Valenzuela is an 88 year old female with a medical history significant for essential hypertension, hyperlipidemia, and mild intermittent asthma presented for follow-up of chronic conditions on 04/12/2022. Reviewed all laboratory values, notable for increased vitamin B12.  Levels of decreased from previous, however we will continue to hold any B12 supplements. Patient's sodium is slightly decreased.  Have patient refrain from using any salt substitutes.  Labs are otherwise unremarkable.- Continue medication, monitor blood pressure at home. Continue DASH diet. Reminder to go to the ER if any CP, SOB, nausea, dizziness, severe HA, changes vision/speech, left arm numbness and tingling and jaw pain.   [name]  APRN, MSN, FNP-C Patient [facility] [address] [place], [place] [address] [phone]

## 2022-04-22 ENCOUNTER — Other Ambulatory Visit: Payer: Self-pay | Admitting: Nurse Practitioner

## 2022-04-22 DIAGNOSIS — J302 Other seasonal allergic rhinitis: Secondary | ICD-10-CM

## 2022-04-22 DIAGNOSIS — J4520 Mild intermittent asthma, uncomplicated: Secondary | ICD-10-CM

## 2022-04-26 ENCOUNTER — Other Ambulatory Visit: Payer: Self-pay

## 2022-04-26 ENCOUNTER — Other Ambulatory Visit: Payer: Self-pay | Admitting: Family Medicine

## 2022-04-26 ENCOUNTER — Telehealth: Payer: Self-pay

## 2022-04-26 DIAGNOSIS — J302 Other seasonal allergic rhinitis: Secondary | ICD-10-CM

## 2022-04-26 DIAGNOSIS — Z2911 Encounter for prophylactic immunotherapy for respiratory syncytial virus (RSV): Secondary | ICD-10-CM

## 2022-04-26 DIAGNOSIS — J4520 Mild intermittent asthma, uncomplicated: Secondary | ICD-10-CM

## 2022-04-26 MED ORDER — FLUTICASONE PROPIONATE 50 MCG/ACT NA SUSP: 2.0000 | Freq: Every day | NASAL | 0 refills | Status: DC

## 2022-04-26 MED ORDER — FLUTICASONE-SALMETEROL 100-50 MCG/ACT IN AEPB: 1.0000 | INHALATION_SPRAY | Freq: Two times a day (BID) | RESPIRATORY_TRACT | 0 refills | Status: DC

## 2022-04-26 NOTE — Progress Notes (Signed)
Orders Placed This Encounter  Procedures   RSV Immune Globulin    Sarah Pounds  APRN, MSN, FNP-C Patient Sarah Valenzuela [address] [place], [place] [address] [phone]

## 2022-04-26 NOTE — Telephone Encounter (Signed)
PT Called and asking if she can get a prescription for and RSV shot to be sent to [facility]

## 2022-05-12 DIAGNOSIS — I340 Nonrheumatic mitral (valve) insufficiency: Secondary | ICD-10-CM | POA: Diagnosis not present

## 2022-05-12 DIAGNOSIS — I10 Essential (primary) hypertension: Secondary | ICD-10-CM | POA: Diagnosis not present

## 2022-05-12 DIAGNOSIS — I2510 Atherosclerotic heart disease of native coronary artery without angina pectoris: Secondary | ICD-10-CM | POA: Diagnosis not present

## 2022-05-12 DIAGNOSIS — E785 Hyperlipidemia, unspecified: Secondary | ICD-10-CM | POA: Diagnosis not present

## 2022-05-26 ENCOUNTER — Other Ambulatory Visit: Payer: Self-pay | Admitting: Family Medicine

## 2022-05-26 DIAGNOSIS — J4520 Mild intermittent asthma, uncomplicated: Secondary | ICD-10-CM

## 2022-05-26 DIAGNOSIS — J302 Other seasonal allergic rhinitis: Secondary | ICD-10-CM

## 2022-05-26 DIAGNOSIS — E782 Mixed hyperlipidemia: Secondary | ICD-10-CM

## 2022-06-15 ENCOUNTER — Telehealth: Payer: Self-pay | Admitting: Family Medicine

## 2022-06-15 NOTE — Telephone Encounter (Signed)
Left message for patient to call back and schedule Medicare Annual Wellness Visit (AWV).   Please offer to do virtually or by telephone.   Last AWV:  05/11/2018   Schedule at any time with [facility] Nurse Health Advisor   30 minute appointment for Virtual or phone  45 minute appointment for Initial virtual/phone  Any questions, please contact me at [phone]   Thank you,   [facility]  Ambulatory Clinical Support for Care Management [facility] You Are. We Are. [facility] ??[phone] or ??[phone]

## 2022-06-23 ENCOUNTER — Other Ambulatory Visit: Payer: Self-pay | Admitting: Family Medicine

## 2022-06-23 DIAGNOSIS — J4520 Mild intermittent asthma, uncomplicated: Secondary | ICD-10-CM

## 2022-06-23 DIAGNOSIS — J302 Other seasonal allergic rhinitis: Secondary | ICD-10-CM

## 2022-07-12 ENCOUNTER — Encounter: Payer: Self-pay | Admitting: Family Medicine

## 2022-07-12 ENCOUNTER — Ambulatory Visit: Payer: Medicare PPO | Admitting: Family Medicine

## 2022-07-12 VITALS — BP 156/82 | HR 94 | Temp 97.7°F | Wt 109.4 lb

## 2022-07-12 DIAGNOSIS — I10 Essential (primary) hypertension: Secondary | ICD-10-CM

## 2022-07-12 DIAGNOSIS — E785 Hyperlipidemia, unspecified: Secondary | ICD-10-CM | POA: Diagnosis not present

## 2022-07-12 DIAGNOSIS — J4520 Mild intermittent asthma, uncomplicated: Secondary | ICD-10-CM | POA: Diagnosis not present

## 2022-07-12 DIAGNOSIS — E538 Deficiency of other specified B group vitamins: Secondary | ICD-10-CM

## 2022-07-12 DIAGNOSIS — Z862 Personal history of diseases of the blood and blood-forming organs and certain disorders involving the immune mechanism: Secondary | ICD-10-CM

## 2022-07-12 NOTE — Patient Instructions (Signed)
Hypertension, Adult Hypertension is another name for high blood pressure. High blood pressure forces your heart to work harder to pump blood. This can cause problems over time. There are two numbers in a blood pressure reading. There is a top number (systolic) over a bottom number (diastolic). It is best to have a blood pressure that is below 120/80. What are the causes? The cause of this condition is not known. Some other conditions can lead to high blood pressure. What increases the risk? Some lifestyle factors can make you more likely to develop high blood pressure: Smoking. Not getting enough exercise or physical activity. Being overweight. Having too much fat, sugar, calories, or salt (sodium) in your diet. Drinking too much alcohol. Other risk factors include: Having any of these conditions: Heart disease. Diabetes. High cholesterol. Kidney disease. Obstructive sleep apnea. Having a family history of high blood pressure and high cholesterol. Age. The risk increases with age. Stress. What are the signs or symptoms? High blood pressure may not cause symptoms. Very high blood pressure (hypertensive crisis) may cause: Headache. Fast or uneven heartbeats (palpitations). Shortness of breath. Nosebleed. Vomiting or feeling like you may vomit (nauseous). Changes in how you see. Very bad chest pain. Feeling dizzy. Seizures. How is this treated? This condition is treated by making healthy lifestyle changes, such as: Eating healthy foods. Exercising more. Drinking less alcohol. Your doctor may prescribe medicine if lifestyle changes do not help enough and if: Your top number is above 130. Your bottom number is above 80. Your personal target blood pressure may vary. Follow these instructions at home: Eating and drinking  If told, follow the DASH eating plan. To follow this plan: Fill one half of your plate at each meal with fruits and vegetables. Fill one fourth of your plate  at each meal with whole grains. Whole grains include whole-wheat pasta, brown rice, and whole-grain bread. Eat or drink low-fat dairy products, such as skim milk or low-fat yogurt. Fill one fourth of your plate at each meal with low-fat (lean) proteins. Low-fat proteins include fish, chicken without skin, eggs, beans, and tofu. Avoid fatty meat, cured and processed meat, or chicken with skin. Avoid pre-made or processed food. Limit the amount of salt in your diet to less than 1,500 mg each day. Do not drink alcohol if: Your doctor tells you not to drink. You are pregnant, may be pregnant, or are planning to become pregnant. If you drink alcohol: Limit how much you have to: 0-1 drink a day for women. 0-2 drinks a day for men. Know how much alcohol is in your drink. In the [place] one drink equals one 12 oz bottle of beer (355 mL), one 5 oz glass of wine (148 mL), or one 1 oz glass of hard liquor (44 mL). Lifestyle  Work with your doctor to stay at a healthy weight or to lose weight. Ask your doctor what the best weight is for you. Get at least 30 minutes of exercise that causes your heart to beat faster (aerobic exercise) most days of the week. This may include walking, swimming, or biking. Get at least 30 minutes of exercise that strengthens your muscles (resistance exercise) at least 3 days a week. This may include lifting weights or doing Pilates. Do not smoke or use any products that contain nicotine or tobacco. If you need help quitting, ask your doctor. Check your blood pressure at home as told by your doctor. Keep all follow-up visits. Medicines Take over-the-counter and prescription medicines  only as told by your doctor. Follow directions carefully. ?Do not skip doses of blood pressure medicine. The medicine does not work as well if you skip doses. Skipping doses also puts you at risk for problems. ?Ask your doctor about side effects or reactions to medicines that you should watch  for. ?Contact a doctor if: ?You think you are having a reaction to the medicine you are taking. ?You have headaches that keep coming back. ?You feel dizzy. ?You have swelling in your ankles. ?You have trouble with your vision. ?Get help right away if: ?You get a very bad headache. ?You start to feel mixed up (confused). ?You feel weak or numb. ?You feel faint. ?You have very bad pain in your: ?Chest. ?Belly (abdomen). ?You vomit more than once. ?You have trouble breathing. ?These symptoms may be an emergency. Get help right away. Call 911. ?Do not wait to see if the symptoms will go away. ?Do not drive yourself to the hospital. ?Summary ?Hypertension is another name for high blood pressure. ?High blood pressure forces your heart to work harder to pump blood. ?For most people, a normal blood pressure is less than 120/80. ?Making healthy choices can help lower blood pressure. If your blood pressure does not get lower with healthy choices, you may need to take medicine. ?This information is not intended to replace advice given to you by your health care provider. Make sure you discuss any questions you have with your health care provider. ?Document Revised: 04/01/2021 Document Reviewed: 04/01/2021 ?Elsevier Patient Education ? 2023 Elsevier Inc. ? ?

## 2022-07-12 NOTE — Progress Notes (Signed)
Established Patient Office Visit  Subjective   Patient ID: Sarah Valenzuela, female    DOB: August 19, 1931  Age: 89 y.o. MRN: [id number]  Chief Complaint  Patient presents with   Hypertension    Follow up    Loise Esguerra is a very pleasant 89 year old female with a medical history significant for essential hypertension, hyperlipidemia, mild intermittent asthma, and depressed mood presents for a follow up of chronic condition.   Patient reports a depressed mood. Depression is situational. Patient states that she has had some major home repairs, that have been costly and time consuming. Patient denies any suicidal or homicidal ideations.     Hypertension This is a chronic problem. The problem is controlled. Pertinent negatives include no anxiety, blurred vision, chest pain, headaches, malaise/fatigue, neck pain, orthopnea, palpitations, peripheral edema, PND, shortness of breath or sweats. Risk factors for coronary artery disease include sedentary lifestyle. Past treatments include calcium channel blockers. There is no history of kidney disease, CAD/MI or heart failure.  Hyperlipidemia This is a chronic problem. The problem is controlled. Pertinent negatives include no chest pain or shortness of breath.    Patient Active Problem List   Diagnosis Date Noted   Asthma 05/04/2015   Medicare annual wellness visit, subsequent 08/20/2014   Vaginal dryness 06/02/2014   Situational depression 05/30/2014   Anxiety state 05/30/2014   Vaginal itching 11/14/2012   Occasional tremors 11/14/2012   HTN (hypertension) 11/14/2012   Hyperlipidemia 11/14/2012   Adjustment disorder with mixed anxiety and depressed mood 11/14/2012   Seasonal allergies 11/14/2012   Past Medical History:  Diagnosis Date   Asthma    Hypercholesteremia    Hyperlipidemia    Hypertension    Osteoporosis 02/2017   T score -3.1   Situational stress    No past surgical history on file. Social History   Tobacco  Use   Smoking status: Never   Smokeless tobacco: Never  Vaping Use   Vaping Use: Never used  Substance Use Topics   Alcohol use: No   Drug use: No   Social History   Socioeconomic History   Marital status: Widowed    Spouse name: Not on file   Number of children: Not on file   Years of education: Not on file   Highest education level: Not on file  Occupational History   Not on file  Tobacco Use   Smoking status: Never   Smokeless tobacco: Never  Vaping Use   Vaping Use: Never used  Substance and Sexual Activity   Alcohol use: No   Drug use: No   Sexual activity: Not Currently    Comment: declined insurance questions  Other Topics Concern   Not on file  Social History Narrative   Not on file   Social Determinants of Health   Financial Resource Strain: Not on file  Food Insecurity: Not on file  Transportation Needs: Not on file  Physical Activity: Not on file  Stress: Not on file  Social Connections: Not on file  Intimate Partner Violence: Not on file   Family Status  Relation Name Status   Brother  (Not Specified)   Sister  (Not Specified)   Sister  (Not Specified)   Family History  Problem Relation Age of Onset   Cancer Brother        Prostate   Breast cancer Sister        UNSURE   Diabetes Sister    Heart disease Sister  Allergies  Allergen Reactions   Fish Allergy    Penicillins Other (See Comments)    Breaks out. Longtime allergy      Review of Systems  Constitutional:  Negative for malaise/fatigue.  HENT: Negative.    Eyes:  Negative for blurred vision.  Respiratory: Negative.  Negative for shortness of breath.   Cardiovascular:  Negative for chest pain, palpitations, orthopnea and PND.  Genitourinary: Negative.   Musculoskeletal: Negative.  Negative for neck pain.  Neurological: Negative.  Negative for headaches.  Psychiatric/Behavioral:  Negative for depression.       Objective:     BP (!) 156/82 Comment: manual  Pulse 94    Temp 97.7 F (36.5 C)   Wt 109 lb 6.4 oz (49.6 kg)   SpO2 98%   BMI 23.67 kg/m  BP Readings from Last 3 Encounters:  07/12/22 (!) 156/82  04/12/22 (!) 152/83  01/04/22 (!) 133/58   Wt Readings from Last 3 Encounters:  07/12/22 109 lb 6.4 oz (49.6 kg)  04/12/22 111 lb 12.8 oz (50.7 kg)  01/04/22 114 lb (51.7 kg)      Physical Exam Constitutional:      Appearance: Normal appearance.  Eyes:     Pupils: Pupils are equal, round, and reactive to light.  Cardiovascular:     Rate and Rhythm: Normal rate and regular rhythm.     Pulses: Normal pulses.  Pulmonary:     Effort: Pulmonary effort is normal.  Abdominal:     General: Bowel sounds are normal.  Musculoskeletal:        General: Normal range of motion.  Skin:    General: Skin is warm.  Neurological:     General: No focal deficit present.     Mental Status: She is alert. Mental status is at baseline.  Psychiatric:        Mood and Affect: Mood normal.        Behavior: Behavior normal.        Thought Content: Thought content normal.        Judgment: Judgment normal.     Results for orders placed or performed in visit on 07/12/22  Comprehensive metabolic panel  Result Value Ref Range   Glucose 98 70 - 99 mg/dL   BUN 6 (L) 8 - 27 mg/dL   Creatinine, Ser 1.76 0.57 - 1.00 mg/dL   eGFR 84 >16 WV/PXT/0.62   BUN/Creatinine Ratio 9 (L) 12 - 28   Sodium 132 (L) 134 - 144 mmol/L   Potassium 3.9 3.5 - 5.2 mmol/L   Chloride 95 (L) 96 - 106 mmol/L   CO2 24 20 - 29 mmol/L   Calcium 9.3 8.7 - 10.3 mg/dL   Total Protein 7.0 6.0 - 8.5 g/dL   Albumin 4.3 3.7 - 4.7 g/dL   Globulin, Total 2.7 1.5 - 4.5 g/dL   Albumin/Globulin Ratio 1.6 1.2 - 2.2   Bilirubin Total 0.3 0.0 - 1.2 mg/dL   Alkaline Phosphatase 54 44 - 121 IU/L   AST 30 0 - 40 IU/L   ALT 16 0 - 32 IU/L  Vitamin B12  Result Value Ref Range   Vitamin B-12 1,066 232 - 1,245 pg/mL    Last CBC Lab Results  Component Value Date   WBC 7.0 02/07/2019   HGB 12.1  02/07/2019   HCT 35.8 06/22/2021   MCV 94 02/07/2019   MCH 30.9 02/07/2019   RDW 12.3 02/07/2019   PLT 218 02/07/2019   Last metabolic panel Lab  Results  Component Value Date   GLUCOSE 98 07/12/2022   NA 132 (L) 07/12/2022   K 3.9 07/12/2022   CL 95 (L) 07/12/2022   CO2 24 07/12/2022   BUN 6 (L) 07/12/2022   CREATININE 0.68 07/12/2022   EGFR 84 07/12/2022   CALCIUM 9.3 07/12/2022   PROT 7.0 07/12/2022   ALBUMIN 4.3 07/12/2022   LABGLOB 2.7 07/12/2022   AGRATIO 1.6 07/12/2022   BILITOT 0.3 07/12/2022   ALKPHOS 54 07/12/2022   AST 30 07/12/2022   ALT 16 07/12/2022   Last lipids Lab Results  Component Value Date   CHOL 168 01/04/2022   HDL 105 01/04/2022   LDLCALC 53 01/04/2022   TRIG 43 01/04/2022   CHOLHDL 1.6 01/04/2022   Last hemoglobin A1c Lab Results  Component Value Date   HGBA1C 5.1 04/22/2016   Last thyroid functions Lab Results  Component Value Date   TSH 1.75 10/17/2012   Last vitamin D Lab Results  Component Value Date   VD25OH 52 05/07/2014   Last vitamin B12 and Folate Lab Results  Component Value Date   VITAMINB12 1,066 07/12/2022   FOLATE >20.0 02/11/2013      The ASCVD Risk score (Arnett DK, et al., 2019) failed to calculate for the following reasons:   The 2019 ASCVD risk score is only valid for ages 20 to 90    Assessment & Plan:   Problem List Items Addressed This Visit       Respiratory   Asthma     Other   Hyperlipidemia (Chronic)   Relevant Orders   Comprehensive metabolic panel (Completed)   Other Visit Diagnoses     Essential hypertension    -  Primary   Relevant Orders   Comprehensive metabolic panel (Completed)   History of anemia       B12 deficiency       Relevant Orders   Vitamin B12 (Completed)     Essential hypertension BP (!) 156/82 Comment: manual  Pulse 94   Temp 97.7 F (36.5 C)   Wt 109 lb 6.4 oz (49.6 kg)   SpO2 98%   BMI 23.67 kg/m   - Comprehensive metabolic panel  Hyperlipidemia,  unspecified hyperlipidemia type  - Comprehensive metabolic panel  Mild intermittent asthma without complication Well controlled, no medication changes warranted  B12 deficiency  - Vitamin B12   Return in about 3 months (around 10/11/2022) for hypertension.   [name]  APRN, MSN, FNP-C Patient [facility] [address] [place], [place] [address] [phone]

## 2022-07-13 LAB — VITAMIN B12: Vitamin B-12: 1066 pg/mL (ref 232–1245)

## 2022-07-13 LAB — COMPREHENSIVE METABOLIC PANEL
ALT: 16 IU/L (ref 0–32)
AST: 30 IU/L (ref 0–40)
Albumin/Globulin Ratio: 1.6 (ref 1.2–2.2)
Albumin: 4.3 g/dL (ref 3.7–4.7)
Alkaline Phosphatase: 54 IU/L (ref 44–121)
BUN/Creatinine Ratio: 9 — ABNORMAL LOW (ref 12–28)
BUN: 6 mg/dL — ABNORMAL LOW (ref 8–27)
Bilirubin Total: 0.3 mg/dL (ref 0.0–1.2)
CO2: 24 mmol/L (ref 20–29)
Calcium: 9.3 mg/dL (ref 8.7–10.3)
Chloride: 95 mmol/L — ABNORMAL LOW (ref 96–106)
Creatinine, Ser: 0.68 mg/dL (ref 0.57–1.00)
Globulin, Total: 2.7 g/dL (ref 1.5–4.5)
Glucose: 98 mg/dL (ref 70–99)
Potassium: 3.9 mmol/L (ref 3.5–5.2)
Sodium: 132 mmol/L — ABNORMAL LOW (ref 134–144)
Total Protein: 7 g/dL (ref 6.0–8.5)
eGFR: 84 mL/min/{1.73_m2} (ref >59)

## 2022-07-13 NOTE — Progress Notes (Signed)
Sarah Valenzuela is an 89 year old female with a medical history significant for hypertension, hyperlipidemia, and mild intermittent asthma that presented for a 89-month follow-up of her chronic conditions.  On yesterday's visit, patient's blood pressure was slightly elevated.  She attributes this elevation in blood pressure to a stressful home environment due to renovations.  Please have patient schedule a 1 month blood pressure check with Joelene Millin, CMA.  Stressed the importance of taking antihypertensive medications prior to arrival. Reviewed patient's laboratory values.  Sodium is slightly decreased below normal range.  Please ensure that patient is not utilizing any salt substitutes.  Patient's appetite has also been decreased, recommend Ensure between meals.  Vitamin B12 has normalized.  We will continue to hold B12 supplement.  Have patient follow-up as scheduled.  [name]  APRN, MSN, FNP-C [facility] [address] [place], [place] [address] [phone]

## 2022-07-20 ENCOUNTER — Telehealth: Payer: Self-pay | Admitting: Family Medicine

## 2022-07-20 NOTE — Telephone Encounter (Signed)
Left message for patient to call back and schedule Medicare Annual Wellness Visit (AWV) in office.   If not able to come in office, please offer to do virtually or by telephone.  Left office number and my jabber [phone].  Last AWV:05/11/2018   Please schedule at anytime with Nurse Health Advisor.

## 2022-07-25 ENCOUNTER — Other Ambulatory Visit: Payer: Self-pay | Admitting: Family Medicine

## 2022-07-25 DIAGNOSIS — J302 Other seasonal allergic rhinitis: Secondary | ICD-10-CM

## 2022-07-25 DIAGNOSIS — I10 Essential (primary) hypertension: Secondary | ICD-10-CM

## 2022-07-25 DIAGNOSIS — J4520 Mild intermittent asthma, uncomplicated: Secondary | ICD-10-CM

## 2022-08-04 ENCOUNTER — Ambulatory Visit: Payer: Medicare PPO

## 2022-08-04 VITALS — Ht <= 58 in | Wt 109.0 lb

## 2022-08-04 DIAGNOSIS — Z0000 Encounter for general adult medical examination without abnormal findings: Secondary | ICD-10-CM | POA: Diagnosis not present

## 2022-08-04 NOTE — Progress Notes (Signed)
Subjective:   Sarah Valenzuela is a 89 y.o. female who presents for Medicare Annual (Subsequent) preventive examination.  Review of Systems    Virtual Visit via Telephone Note  I connected with  Sarah Valenzuela on 08/04/22 at  2:30 PM EST by telephone and verified that I am speaking with the correct person using two identifiers.  Location: Patient: Home Provider: Office Persons participating in the virtual visit: patient/Nurse Health Advisor   I discussed the limitations, risks, security and privacy concerns of performing an evaluation and management service by telephone and the availability of in person appointments. The patient expressed understanding and agreed to proceed.  Interactive audio and video telecommunications were attempted between this nurse and patient, however failed, due to patient having technical difficulties OR patient did not have access to video capability.  We continued and completed visit with audio only.  Some vital signs may be absent or patient reported.   Criselda Peaches, LPN  Cardiac Risk Factors include: advanced age (>22mn, >>27women);hypertension     Objective:    Today's Vitals   08/04/22 1609  Weight: 109 lb (49.4 kg)  Height: 4' 9"$  (1.448 m)   Body mass index is 23.59 kg/m.     08/04/2022    4:16 PM 01/20/2017   10:22 AM 10/21/2016   10:31 AM 04/22/2016   10:04 AM 10/22/2015   10:16 AM 07/24/2015   10:09 AM 05/04/2015    1:26 PM  Advanced Directives  Does Patient Have a Medical Advance Directive? Yes No No  No No No  Type of [place]aramed[place]of [place] will        Copy of [place] Chart? No - copy requested        Would patient like information on creating a medical advance directive?    Yes - Educational materials given No - patient declined information No - patient declined information No - patient declined information    Current Medications (verified) Outpatient  Encounter Medications as of 08/04/2022  Medication Sig   acetaminophen (TYLENOL) 500 MG tablet Take 1 tablet (500 mg total) by mouth every 6 (six) hours as needed for pain.   albuterol (VENTOLIN HFA) 108 (90 Base) MCG/ACT inhaler Inhale 2 puffs into the lungs every 6 (six) hours as needed for wheezing or shortness of breath.   aspirin 81 MG chewable tablet Chew 81 mg by mouth daily.   calcium citrate (CALCITRATE - DOSED IN MG ELEMENTAL CALCIUM) 950 MG tablet Take 1 tablet by mouth 2 (two) times daily.   cetirizine (ZYRTEC) 10 MG tablet Take 1 tablet (10 mg total) by mouth daily.   Cholecalciferol (VITAMIN D3) 125 MCG (5000 UT) CAPS Take 1 capsule by mouth daily.   clobetasol ointment (TEMOVATE) 0.05 % APPLY TWICE A WEEK, LONG TERM.   cycloSPORINE (RESTASIS) 0.05 % ophthalmic emulsion Place 1 drop into both eyes 2 (two) times daily.   feeding supplement, ENSURE COMPLETE, (ENSURE COMPLETE) LIQD Take 237 mLs by mouth 2 (two) times daily between meals. Two bottles daily   fluticasone (FLONASE) 50 MCG/ACT nasal spray Place 2 sprays into both nostrils daily.   fluticasone-salmeterol (ADVAIR) 100-50 MCG/ACT AEPB Inhale 1 puff into the lungs 2 (two) times daily.   Multiple Vitamins-Minerals (AIRBORNE PO) Take by mouth.   rosuvastatin (CRESTOR) 5 MG tablet TAKE ONE TABLET BY MOUTH DAILY   SENNOSIDES PO Take 8.5 mg by mouth as needed (Swiss Kriss OTC Laxative).   triamterene-hydrochlorothiazide (  MAXZIDE-25) 37.5-25 MG tablet TAKE (1/2) TABLET DAILY.   zafirlukast (ACCOLATE) 20 MG tablet TAKE 1 TABLET BY MOUTH TWICE DAILY.   No facility-administered encounter medications on file as of 08/04/2022.    Allergies (verified) Fish allergy and Penicillins   History: Past Medical History:  Diagnosis Date   Asthma    Hypercholesteremia    Hyperlipidemia    Hypertension    Osteoporosis 02/2017   T score -3.1   Situational stress    History reviewed. No pertinent surgical history. Family History  Problem  Relation Age of Onset   Cancer Brother        Prostate   Breast cancer Sister        UNSURE   Diabetes Sister    Heart disease Sister    Social History   Socioeconomic History   Marital status: Widowed    Spouse name: Not on file   Number of children: Not on file   Years of education: Not on file   Highest education level: Not on file  Occupational History   Not on file  Tobacco Use   Smoking status: Never   Smokeless tobacco: Never  Vaping Use   Vaping Use: Never used  Substance and Sexual Activity   Alcohol use: No   Drug use: No   Sexual activity: Not Currently    Comment: declined insurance questions  Other Topics Concern   Not on file  Social History Narrative   Not on file   Social Determinants of Health   Financial Resource Strain: Low Risk  (08/04/2022)   Overall Financial Resource Strain (CARDIA)    Difficulty of Paying Living Expenses: Not hard at all  Food Insecurity: No Food Insecurity (08/04/2022)   Hunger Vital Sign    Worried About Running Out of Food in the Last Year: Never true    [place] in the Last Year: Never true  Transportation Needs: No Transportation Needs (08/04/2022)   PRAPARE - Hydrologist (Medical): No    Lack of Transportation (Non-Medical): No  Physical Activity: Inactive (08/04/2022)   Exercise Vital Sign    Days of Exercise per Week: 0 days    Minutes of Exercise per Session: 0 min  Stress: No Stress Concern Present (08/04/2022)   [place]    Feeling of Stress : Not at all  Social Connections: Moderately Integrated (08/04/2022)   Social Connection and Isolation Panel [NHANES]    Frequency of Communication with Friends and Family: More than three times a week    Frequency of Social Gatherings with Friends and Family: More than three times a week    Attends Religious Services: More than 4 times per year    Active Member of [employer] or  Organizations: Yes    Attends Archivist Meetings: More than 4 times per year    Marital Status: Widowed    Tobacco Counseling Counseling given: Not Answered   Clinical Intake:  Pre-visit preparation completed: No  Pain : No/denies pain     BMI - recorded: 23.59 Nutritional Status: BMI of 19-24  Normal Nutritional Risks: None Diabetes: No  How often do you need to have someone help you when you read instructions, pamphlets, or other written materials from your doctor or pharmacy?: 1 - Never  Diabetic? No     Information entered by :: Sarah Arbour LPN   Activities of Daily Living    08/04/2022  4:15 PM  In your present state of health, do you have any difficulty performing the following activities:  Hearing? 0  Vision? 0  Difficulty concentrating or making decisions? 0  Walking or climbing stairs? 0  Dressing or bathing? 0  Doing errands, shopping? 0  Preparing Food and eating ? N  Using the Toilet? N  In the past six months, have you accidently leaked urine? N  Do you have problems with loss of bowel control? N  Managing your Medications? N  Managing your Finances? N  Housekeeping or managing your Housekeeping? N    Patient Care Team: Dorena Dew, FNP as PCP - General (Family Medicine)  Indicate any recent Medical Services you may have received from other than [facility] providers in the past year (date may be approximate).     Assessment:   This is a routine wellness examination for Sarah Valenzuela.  Hearing/Vision screen Hearing Screening - Comments:: Denies hearing difficulties   Vision Screening - Comments:: Wears rx glasses - up to date with routine eye exams with  Dr Herbert Deaner  Dietary issues and exercise activities discussed: Exercise limited by: None identified   Goals Addressed               This Visit's Progress     No current goals (pt-stated)        Current goals       Depression Screen    08/04/2022    4:14 PM 07/12/2022     9:33 AM 01/04/2022   10:31 AM 09/28/2021    8:43 AM 06/22/2021   11:07 AM 03/23/2021   10:16 AM 01/21/2020    8:58 AM  PHQ 2/9 Scores  PHQ - 2 Score 0 0 0 0 0 0 1  PHQ- 9 Score   0        Fall Risk    08/04/2022    4:15 PM 07/12/2022    9:34 AM 09/28/2021    8:40 AM 06/22/2021   11:07 AM 03/23/2021   10:16 AM  Fall Risk   Falls in the past year? 0 0 0 0 0  Number falls in past yr: 0 0 0 0 0  Injury with Fall? 0 0 0 0 0  Risk for fall due to : No Fall Risks No Fall Risks No Fall Risks    Follow up Falls prevention discussed Falls evaluation completed Falls evaluation completed      [place]:  Any stairs in or around the home? Yes  If so, are there any without handrails? No  Home free of loose throw rugs in walkways, pet beds, electrical cords, etc? Yes  Adequate lighting in your home to reduce risk of falls? Yes   ASSISTIVE DEVICES UTILIZED TO PREVENT FALLS:  Life alert? No  Use of a cane, walker or w/c? No  Grab bars in the bathroom? Yes  Shower chair or bench in shower? No  Elevated toilet seat or a handicapped toilet? No   TIMED UP AND GO:  Was the test performed? No . Audio Visit   Cognitive Function:    05/11/2018   11:08 AM  MMSE - Mini Mental State Exam  Orientation to time 5  Orientation to Place 5  Registration 3  Attention/ Calculation 5  Recall 3  Language- name 2 objects 2  Language- repeat 1  Language- follow 3 step command 3  Language- read & follow direction 1  Write a sentence 1  Copy  design 1  Total score 30        08/04/2022    4:16 PM  6CIT Screen  What Year? 0 points  What month? 0 points  What time? 0 points  Count back from 20 0 points  Months in reverse 0 points  Repeat phrase 0 points  Total Score 0 points    Immunizations Immunization History  Administered Date(s) Administered   Covid-19, Mrna,Vaccine(Spikevax)3yr and older 04/18/2022   Fluad Quad(high Dose 65+) 04/12/2022   Influenza  Whole 04/17/2012   Influenza,inj,Quad PF,6+ Mos 04/01/2013, 03/24/2014, 04/22/2016, 04/02/2018, 03/05/2019, 03/23/2021   Influenza-Unspecified 04/19/2015, 04/07/2017   PFIZER(Purple Top)SARS-COV-2 Vaccination 07/12/2019, 07/31/2019, 03/27/2020, 03/11/2021   Pfizer Covid-19 Vaccine Bivalent Booster 180yr& up 03/11/2021   Pneumococcal Conjugate-13 08/20/2014   Pneumococcal Polysaccharide-23 09/28/2021   Rsv, Bivalent, Protein Subunit Rsvpref,pf (AEvans Lance11/20/2023   Tdap 08/21/2013   Zoster Recombinat (Shingrix) 05/02/2022    TDAP status: Up to date  Flu Vaccine status: Up to date  Pneumococcal vaccine status: Up to date  Covid-19 vaccine status: Completed vaccines  Qualifies for Shingles Vaccine? Yes   Zostavax completed Yes   Shingrix Completed?: Yes  Screening Tests Health Maintenance  Topic Date Due   Zoster Vaccines- Shingrix (2 of 2) 06/27/2022   COVID-19 Vaccine (6 - 2023-24 season) 08/20/2022 (Originally 06/13/2022)   Medicare Annual Wellness (AWV)  08/05/2023   DTaP/Tdap/Td (2 - Td or Tdap) 08/22/2023   Pneumonia Vaccine 6568Years old  Completed   DEXA SCAN  Completed   HPV VACCINES  Aged Out    Health Maintenance  Health Maintenance Due  Topic Date Due   Zoster Vaccines- Shingrix (2 of 2) 06/27/2022    Colorectal cancer screening: No longer required.   Mammogram status: No longer required due to Age.  Bone Density status: Completed 03/06/17. Results reflect: Bone density results: OSTEOPOROSIS. Repeat every   years.  Lung Cancer Screening: (Low Dose CT Chest recommended if Age 87-80ears, 30 pack-year currently smoking OR have quit w/in 15years.) does not qualify.     Additional Screening:  Hepatitis C Screening: does not qualify; Completed   Vision Screening: Recommended annual ophthalmology exams for early detection of glaucoma and other disorders of the eye. Is the patient up to date with their annual eye exam?  Yes  Who is the provider or what  is the name of the office in which the patient attends annual eye exams? Dr HeHerbert Deanerf pt is not established with a provider, would they like to be referred to a provider to establish care? No .   Dental Screening: Recommended annual dental exams for proper oral hygiene  Community Resource Referral / Chronic Care Management:  CRR required this visit?  No   CCM required this visit?  No      Plan:     I have personally reviewed and noted the following in the patient's chart:   Medical and social history Use of alcohol, tobacco or illicit drugs  Current medications and supplements including opioid prescriptions. Patient is not currently taking opioid prescriptions. Functional ability and status Nutritional status Physical activity Advanced directives List of other physicians Hospitalizations, surgeries, and ER visits in previous 12 months Vitals Screenings to include cognitive, depression, and falls Referrals and appointments  In addition, I have reviewed and discussed with patient certain preventive protocols, quality metrics, and best practice recommendations. A written personalized care plan for preventive services as well as general preventive health recommendations were provided to patient.  Criselda Peaches, LPN   [id number]   Nurse Notes: None

## 2022-08-04 NOTE — Patient Instructions (Addendum)
Sarah Valenzuela , Thank you for taking time to come for your Medicare Wellness Visit. I appreciate your ongoing commitment to your health goals. Please review the following plan we discussed and let me know if I can assist you in the future.   These are the goals we discussed:  Goals       No current goals (pt-stated)      Current goals      Prevent falls        This is a list of the screening recommended for you and due dates:  Health Maintenance  Topic Date Due   Zoster (Shingles) Vaccine (2 of 2) 06/27/2022   COVID-19 Vaccine (6 - 2023-24 season) 08/20/2022*   Medicare Annual Wellness Visit  08/05/2023   DTaP/Tdap/Td vaccine (2 - Td or Tdap) 08/22/2023   Pneumonia Vaccine  Completed   DEXA scan (bone density measurement)  Completed   HPV Vaccine  Aged Out  *Topic was postponed. The date shown is not the original due date.    Advanced directives: Please bring a copy of your health care power of attorney and living will to the office to be added to your chart at your convenience.   Conditions/risks identified: None  Next appointment: Follow up in one year for your annual wellness visit     Preventive Care 65 Years and Older, Female Preventive care refers to lifestyle choices and visits with your health care provider that can promote health and wellness. What does preventive care include? A yearly physical exam. This is also called an annual well check. Dental exams once or twice a year. Routine eye exams. Ask your health care provider how often you should have your eyes checked. Personal lifestyle choices, including: Daily care of your teeth and gums. Regular physical activity. Eating a healthy diet. Avoiding tobacco and drug use. Limiting alcohol use. Practicing safe sex. Taking low-dose aspirin every day. Taking vitamin and mineral supplements as recommended by your health care provider. What happens during an annual well check? The services and screenings done by  your health care provider during your annual well check will depend on your age, overall health, lifestyle risk factors, and family history of disease. Counseling  Your health care provider may ask you questions about your: Alcohol use. Tobacco use. Drug use. Emotional well-being. Home and relationship well-being. Sexual activity. Eating habits. History of falls. Memory and ability to understand (cognition). Work and work Statistician. Reproductive health. Screening  You may have the following tests or measurements: Height, weight, and BMI. Blood pressure. Lipid and cholesterol levels. These may be checked every 5 years, or more frequently if you are over 76 years old. Skin check. Lung cancer screening. You may have this screening every year starting at age 57 if you have a 30-pack-year history of smoking and currently smoke or have quit within the past 15 years. Fecal occult blood test (FOBT) of the stool. You may have this test every year starting at age 51. Flexible sigmoidoscopy or colonoscopy. You may have a sigmoidoscopy every 5 years or a colonoscopy every 10 years starting at age 65. Hepatitis C blood test. Hepatitis B blood test. Sexually transmitted disease (STD) testing. Diabetes screening. This is done by checking your blood sugar (glucose) after you have not eaten for a while (fasting). You may have this done every 1-3 years. Bone density scan. This is done to screen for osteoporosis. You may have this done starting at age 62. Mammogram. This may be done every  1-2 years. Talk to your health care provider about how often you should have regular mammograms. Talk with your health care provider about your test results, treatment options, and if necessary, the need for more tests. Vaccines  Your health care provider may recommend certain vaccines, such as: Influenza vaccine. This is recommended every year. Tetanus, diphtheria, and acellular pertussis (Tdap, Td) vaccine. You  may need a Td booster every 10 years. Zoster vaccine. You may need this after age 44. Pneumococcal 13-valent conjugate (PCV13) vaccine. One dose is recommended after age 50. Pneumococcal polysaccharide (PPSV23) vaccine. One dose is recommended after age 29. Talk to your health care provider about which screenings and vaccines you need and how often you need them. This information is not intended to replace advice given to you by your health care provider. Make sure you discuss any questions you have with your health care provider. Document Released: 07/10/2015 Document Revised: 03/02/2016 Document Reviewed: 04/14/2015 Elsevier Interactive Patient Education  2017 [place] Prevention in the Home Falls can cause injuries. They can happen to people of all ages. There are many things you can do to make your home safe and to help prevent falls. What can I do on the outside of my home? Regularly fix the edges of walkways and driveways and fix any cracks. Remove anything that might make you trip as you walk through a door, such as a raised step or threshold. Trim any bushes or trees on the path to your home. Use bright outdoor lighting. Clear any walking paths of anything that might make someone trip, such as rocks or tools. Regularly check to see if handrails are loose or broken. Make sure that both sides of any steps have handrails. Any raised decks and porches should have guardrails on the edges. Have any leaves, snow, or ice cleared regularly. Use sand or salt on walking paths during winter. Clean up any spills in your garage right away. This includes oil or grease spills. What can I do in the bathroom? Use night lights. Install grab bars by the toilet and in the tub and shower. Do not use towel bars as grab bars. Use non-skid mats or decals in the tub or shower. If you need to sit down in the shower, use a plastic, non-slip stool. Keep the floor dry. Clean up any water that spills  on the floor as soon as it happens. Remove soap buildup in the tub or shower regularly. Attach bath mats securely with double-sided non-slip rug tape. Do not have throw rugs and other things on the floor that can make you trip. What can I do in the bedroom? Use night lights. Make sure that you have a light by your bed that is easy to reach. Do not use any sheets or blankets that are too big for your bed. They should not hang down onto the floor. Have a firm chair that has side arms. You can use this for support while you get dressed. Do not have throw rugs and other things on the floor that can make you trip. What can I do in the kitchen? Clean up any spills right away. Avoid walking on wet floors. Keep items that you use a lot in easy-to-reach places. If you need to reach something above you, use a strong step stool that has a grab bar. Keep electrical cords out of the way. Do not use floor polish or wax that makes floors slippery. If you must use wax, use non-skid  floor wax. Do not have throw rugs and other things on the floor that can make you trip. What can I do with my stairs? Do not leave any items on the stairs. Make sure that there are handrails on both sides of the stairs and use them. Fix handrails that are broken or loose. Make sure that handrails are as long as the stairways. Check any carpeting to make sure that it is firmly attached to the stairs. Fix any carpet that is loose or worn. Avoid having throw rugs at the top or bottom of the stairs. If you do have throw rugs, attach them to the floor with carpet tape. Make sure that you have a light switch at the top of the stairs and the bottom of the stairs. If you do not have them, ask someone to add them for you. What else can I do to help prevent falls? Wear shoes that: Do not have high heels. Have rubber bottoms. Are comfortable and fit you well. Are closed at the toe. Do not wear sandals. If you use a stepladder: Make  sure that it is fully opened. Do not climb a closed stepladder. Make sure that both sides of the stepladder are locked into place. Ask someone to hold it for you, if possible. Clearly mark and make sure that you can see: Any grab bars or handrails. First and last steps. Where the edge of each step is. Use tools that help you move around (mobility aids) if they are needed. These include: Canes. Walkers. Scooters. Crutches. Turn on the lights when you go into a dark area. Replace any light bulbs as soon as they burn out. Set up your furniture so you have a clear path. Avoid moving your furniture around. If any of your floors are uneven, fix them. If there are any pets around you, be aware of where they are. Review your medicines with your doctor. Some medicines can make you feel dizzy. This can increase your chance of falling. Ask your doctor what other things that you can do to help prevent falls. This information is not intended to replace advice given to you by your health care provider. Make sure you discuss any questions you have with your health care provider. Document Released: 04/09/2009 Document Revised: 11/19/2015 Document Reviewed: 07/18/2014 Elsevier Interactive Patient Education  2017 [employer].

## 2022-08-15 ENCOUNTER — Ambulatory Visit (INDEPENDENT_AMBULATORY_CARE_PROVIDER_SITE_OTHER): Payer: Medicare PPO

## 2022-08-15 VITALS — BP 136/52 | HR 76

## 2022-08-15 DIAGNOSIS — I10 Essential (primary) hypertension: Secondary | ICD-10-CM | POA: Diagnosis not present

## 2022-08-15 NOTE — Progress Notes (Signed)
   Blood Pressure Recheck Visit  Name: Sarah Valenzuela MRN: [id number] Date of Birth: December 09, 1931  [name] presents today for Blood Pressure recheck with clinical support staff.  Order for BP recheck by Cammie Sickle, ordered on 07/12/2022.   BP Readings from Last 3 Encounters:  07/12/22 (!) 156/82  04/12/22 (!) 152/83  01/04/22 (!) 133/58    Current Outpatient Medications  Medication Sig Dispense Refill   acetaminophen (TYLENOL) 500 MG tablet Take 1 tablet (500 mg total) by mouth every 6 (six) hours as needed for pain. 30 tablet 0   albuterol (VENTOLIN HFA) 108 (90 Base) MCG/ACT inhaler Inhale 2 puffs into the lungs every 6 (six) hours as needed for wheezing or shortness of breath. 8.5 g 1   aspirin 81 MG chewable tablet Chew 81 mg by mouth daily.     calcium citrate (CALCITRATE - DOSED IN MG ELEMENTAL CALCIUM) 950 MG tablet Take 1 tablet by mouth 2 (two) times daily.     cetirizine (ZYRTEC) 10 MG tablet Take 1 tablet (10 mg total) by mouth daily. 90 tablet 1   Cholecalciferol (VITAMIN D3) 125 MCG (5000 UT) CAPS Take 1 capsule by mouth daily.     clobetasol ointment (TEMOVATE) 0.05 % APPLY TWICE A WEEK, LONG TERM. 30 g 3   cycloSPORINE (RESTASIS) 0.05 % ophthalmic emulsion Place 1 drop into both eyes 2 (two) times daily.     feeding supplement, ENSURE COMPLETE, (ENSURE COMPLETE) LIQD Take 237 mLs by mouth 2 (two) times daily between meals. Two bottles daily 13272 mL 12   fluticasone (FLONASE) 50 MCG/ACT nasal spray Place 2 sprays into both nostrils daily. 16 g 5   fluticasone-salmeterol (ADVAIR) 100-50 MCG/ACT AEPB Inhale 1 puff into the lungs 2 (two) times daily. 60 each 5   Multiple Vitamins-Minerals (AIRBORNE PO) Take by mouth.     rosuvastatin (CRESTOR) 5 MG tablet TAKE ONE TABLET BY MOUTH DAILY 90 tablet 0   SENNOSIDES PO Take 8.5 mg by mouth as needed (Swiss Kriss OTC Laxative).     triamterene-hydrochlorothiazide (MAXZIDE-25) 37.5-25 MG tablet TAKE (1/2)  TABLET DAILY. 45 tablet 5   zafirlukast (ACCOLATE) 20 MG tablet TAKE 1 TABLET BY MOUTH TWICE DAILY. 180 tablet 0   No current facility-administered medications for this visit.    Hypertensive Medication Review: Patient states that they are taking all their hypertensive medications as prescribed and their last dose of hypertensive medications was this morning   Documentation of any medication adherence discrepancies: none  Provider Recommendation:  Spoke to Mrs. Lemmon and they stated: Diet is fine and she eats fruit and drink plenty of water.  Patient has been scheduled to follow up with 10/18/22   Patient has been given provider's recommendations and does not have any questions or concerns at this time. Patient will contact the office for any future questions or concerns.    Elyse Jarvis RMA

## 2022-08-25 ENCOUNTER — Other Ambulatory Visit: Payer: Self-pay | Admitting: Family Medicine

## 2022-08-25 DIAGNOSIS — J4520 Mild intermittent asthma, uncomplicated: Secondary | ICD-10-CM

## 2022-08-25 DIAGNOSIS — E782 Mixed hyperlipidemia: Secondary | ICD-10-CM

## 2022-08-26 NOTE — Telephone Encounter (Signed)
Please advise KH 

## 2022-10-18 ENCOUNTER — Ambulatory Visit (INDEPENDENT_AMBULATORY_CARE_PROVIDER_SITE_OTHER): Payer: Medicare PPO | Admitting: Family Medicine

## 2022-10-18 ENCOUNTER — Encounter: Payer: Self-pay | Admitting: Family Medicine

## 2022-10-18 VITALS — BP 134/65 | HR 79 | Temp 97.4°F | Wt 107.6 lb

## 2022-10-18 DIAGNOSIS — I10 Essential (primary) hypertension: Secondary | ICD-10-CM | POA: Diagnosis not present

## 2022-10-18 DIAGNOSIS — R634 Abnormal weight loss: Secondary | ICD-10-CM | POA: Diagnosis not present

## 2022-10-18 DIAGNOSIS — R630 Anorexia: Secondary | ICD-10-CM | POA: Diagnosis not present

## 2022-10-18 DIAGNOSIS — E46 Unspecified protein-calorie malnutrition: Secondary | ICD-10-CM

## 2022-10-18 DIAGNOSIS — E785 Hyperlipidemia, unspecified: Secondary | ICD-10-CM | POA: Diagnosis not present

## 2022-10-18 DIAGNOSIS — E782 Mixed hyperlipidemia: Secondary | ICD-10-CM

## 2022-10-18 DIAGNOSIS — J4520 Mild intermittent asthma, uncomplicated: Secondary | ICD-10-CM

## 2022-10-18 MED ORDER — ENSURE COMPLETE PO LIQD: 237.0000 mL | Freq: Two times a day (BID) | ORAL | 12 refills | Status: AC

## 2022-10-18 NOTE — Progress Notes (Signed)
Subjective   Patient ID: Sarah Valenzuela, female    DOB: 01-15-1932  Age: 89 y.o. MRN: [id number]  Chief Complaint  Patient presents with   Hypertension    Follow up    Sarah Valenzuela is a very pleasant 89 year old female with a medical history significant for essential hypertension, mild intermittent asthma, and hyperlipidemia presents for 40-month follow-up of chronic conditions.  Patient says that she is doing very well and is without complaint on today.  Hypertension This is a chronic problem. The problem is controlled. Pertinent negatives include no anxiety, blurred vision, chest pain, headaches, malaise/fatigue, neck pain, orthopnea, palpitations, peripheral edema, PND, shortness of breath or sweats. Risk factors for coronary artery disease include dyslipidemia. There is no history of kidney disease or CAD/MI. There is no history of chronic renal disease.    Patient Active Problem List   Diagnosis Date Noted   Asthma 05/04/2015   Medicare annual wellness visit, subsequent 08/20/2014   Vaginal dryness 06/02/2014   Situational depression 05/30/2014   Anxiety state 05/30/2014   Vaginal itching 11/14/2012   Occasional tremors 11/14/2012   HTN (hypertension) 11/14/2012   Hyperlipidemia 11/14/2012   Adjustment disorder with mixed anxiety and depressed mood 11/14/2012   Seasonal allergies 11/14/2012   Past Medical History:  Diagnosis Date   Asthma    Hypercholesteremia    Hyperlipidemia    Hypertension    Osteoporosis 02/2017   T score -3.1   Situational stress    No past surgical history on file. Social History   Tobacco Use   Smoking status: Never   Smokeless tobacco: Never  Vaping Use   Vaping Use: Never used  Substance Use Topics   Alcohol use: No   Drug use: No   Social History   Socioeconomic History   Marital status: Widowed    Spouse name: Not on file   Number of children: Not on file   Years of education: Not on file   Highest education  level: Not on file  Occupational History   Not on file  Tobacco Use   Smoking status: Never   Smokeless tobacco: Never  Vaping Use   Vaping Use: Never used  Substance and Sexual Activity   Alcohol use: No   Drug use: No   Sexual activity: Not Currently    Comment: declined insurance questions  Other Topics Concern   Not on file  Social History Narrative   Not on file   Social Determinants of Health   Financial Resource Strain: Low Risk  (08/04/2022)   Overall Financial Resource Strain (CARDIA)    Difficulty of Paying Living Expenses: Not hard at all  Food Insecurity: No Food Insecurity (08/04/2022)   Hunger Vital Sign    Worried About Running Out of Food in the Last Year: Never true    Ran Out of Food in the Last Year: Never true  Transportation Needs: No Transportation Needs (08/04/2022)   PRAPARE - Administrator, Civil Service (Medical): No    Lack of Transportation (Non-Medical): No  Physical Activity: Inactive (08/04/2022)   Exercise Vital Sign    Days of Exercise per Week: 0 days    Minutes of Exercise per Session: 0 min  Stress: No Stress Concern Present (08/04/2022)   [employer] of Occupational Health - Occupational Stress Questionnaire    Feeling of Stress : Not at all  Social Connections: Moderately Integrated (08/04/2022)   Social Connection and Isolation Panel [NHANES]  Frequency of Communication with Friends and Family: More than three times a week    Frequency of Social Gatherings with Friends and Family: More than three times a week    Attends Religious Services: More than 4 times per year    Active Member of [employer] or Organizations: Yes    Attends Banker Meetings: More than 4 times per year    Marital Status: Widowed  Intimate Partner Violence: Not At Risk (08/04/2022)   Humiliation, Afraid, Rape, and Kick questionnaire    Fear of Current or Ex-Partner: No    Emotionally Abused: No    Physically Abused: No    Sexually Abused: No    Family Status  Relation Name Status   Brother  (Not Specified)   Sister  (Not Specified)   Sister  (Not Specified)   Family History  Problem Relation Age of Onset   Cancer Brother        Prostate   Breast cancer Sister        UNSURE   Diabetes Sister    Heart disease Sister       Review of Systems  Constitutional: Negative.  Negative for malaise/fatigue.  HENT: Negative.    Eyes: Negative.  Negative for blurred vision.  Respiratory: Negative.  Negative for shortness of breath.   Cardiovascular: Negative.  Negative for chest pain, palpitations, orthopnea and PND.  Musculoskeletal:  Positive for back pain and joint pain. Negative for neck pain.  Skin: Negative.   Neurological:  Negative for headaches.      Objective:     BP 134/65   Pulse 79   Temp (!) 97.4 F (36.3 C)   Wt 107 lb 9.6 oz (48.8 kg)   SpO2 100%   BMI 23.28 kg/m  BP Readings from Last 3 Encounters:  10/18/22 134/65  08/15/22 (!) 136/52  07/12/22 (!) 156/82   Wt Readings from Last 3 Encounters:  10/18/22 107 lb 9.6 oz (48.8 kg)  08/04/22 109 lb (49.4 kg)  07/12/22 109 lb 6.4 oz (49.6 kg)      Physical Exam Constitutional:      Appearance: Normal appearance.  Eyes:     Pupils: Pupils are equal, round, and reactive to light.  Cardiovascular:     Rate and Rhythm: Normal rate and regular rhythm.     Pulses: Normal pulses.     Heart sounds: Normal heart sounds.  Pulmonary:     Effort: Pulmonary effort is normal.  Abdominal:     General: Bowel sounds are normal.  Musculoskeletal:        General: Normal range of motion.  Skin:    General: Skin is warm.  Neurological:     General: No focal deficit present.     Mental Status: She is alert. Mental status is at baseline.  Psychiatric:        Mood and Affect: Mood normal.        Behavior: Behavior normal.        Thought Content: Thought content normal.        Judgment: Judgment normal.      Results for orders placed or performed in  visit on 10/18/22  Comprehensive metabolic panel  Result Value Ref Range   Glucose 124 (H) 70 - 99 mg/dL   BUN 10 8 - 27 mg/dL   Creatinine, Ser 1.61 0.57 - 1.00 mg/dL   eGFR 61 >09 UE/AVW/0.98   BUN/Creatinine Ratio 11 (L) 12 - 28   Sodium 143 134 -  144 mmol/L   Potassium 3.7 3.5 - 5.2 mmol/L   Chloride 106 96 - 106 mmol/L   CO2 23 20 - 29 mmol/L   Calcium 8.9 8.7 - 10.3 mg/dL   Total Protein 6.4 6.0 - 8.5 g/dL   Albumin 4.2 3.7 - 4.7 g/dL   Globulin, Total 2.2 1.5 - 4.5 g/dL   Albumin/Globulin Ratio 1.9 1.2 - 2.2   Bilirubin Total <0.2 0.0 - 1.2 mg/dL   Alkaline Phosphatase 85 44 - 121 IU/L   AST 26 0 - 40 IU/L   ALT 40 (H) 0 - 32 IU/L    Last CBC Lab Results  Component Value Date   WBC 7.0 02/07/2019   HGB 12.1 02/07/2019   HCT 35.8 06/22/2021   MCV 94 02/07/2019   MCH 30.9 02/07/2019   RDW 12.3 02/07/2019   PLT 218 02/07/2019   Last metabolic panel Lab Results  Component Value Date   GLUCOSE 124 (H) 10/18/2022   NA 143 10/18/2022   K 3.7 10/18/2022   CL 106 10/18/2022   CO2 23 10/18/2022   BUN 10 10/18/2022   CREATININE 0.91 10/18/2022   EGFR 61 10/18/2022   CALCIUM 8.9 10/18/2022   PROT 6.4 10/18/2022   ALBUMIN 4.2 10/18/2022   LABGLOB 2.2 10/18/2022   AGRATIO 1.9 10/18/2022   BILITOT <0.2 10/18/2022   ALKPHOS 85 10/18/2022   AST 26 10/18/2022   ALT 40 (H) 10/18/2022   Last lipids Lab Results  Component Value Date   CHOL 168 01/04/2022   HDL 105 01/04/2022   LDLCALC 53 01/04/2022   TRIG 43 01/04/2022   CHOLHDL 1.6 01/04/2022   Last hemoglobin A1c Lab Results  Component Value Date   HGBA1C 5.1 04/22/2016   Last thyroid functions Lab Results  Component Value Date   TSH 1.75 10/17/2012   Last vitamin D Lab Results  Component Value Date   VD25OH 52 05/07/2014   Last vitamin B12 and Folate Lab Results  Component Value Date   VITAMINB12 1,066 07/12/2022   FOLATE >20.0 02/11/2013      The ASCVD Risk score (Arnett DK, et al., 2019)  failed to calculate for the following reasons:   The 2019 ASCVD risk score is only valid for ages 55 to 23    Assessment & Plan:   Problem List Items Addressed This Visit       Respiratory   Asthma     Other   Hyperlipidemia (Chronic)   Other Visit Diagnoses     Essential hypertension    -  Primary   Relevant Orders   Comprehensive metabolic panel (Completed)   Weight loss, non-intentional       Decreased appetite       Relevant Medications   feeding supplement, ENSURE COMPLETE, (ENSURE COMPLETE) LIQD   Malnutrition, unspecified type (HCC)       Relevant Medications   feeding supplement, ENSURE COMPLETE, (ENSURE COMPLETE) LIQD      1. Essential hypertension BP 134/65   Pulse 79   Temp (!) 97.4 F (36.3 C)   Wt 107 lb 9.6 oz (48.8 kg)   SpO2 100%   BMI 23.28 kg/m   - Comprehensive metabolic panel  2. Mild intermittent asthma without complication Remains well controlled.   3. Hyperlipidemia, unspecified hyperlipidemia type The patient is asked to make an attempt to improve diet and exercise patterns to aid in medical management of this problem.   4. Weight loss, non-intentional Will monitor closely  5. Decreased  appetite  - feeding supplement, ENSURE COMPLETE, (ENSURE COMPLETE) LIQD; Take 237 mLs by mouth 2 (two) times daily between meals. Two bottles daily  Dispense: 13272 mL; Refill: 12  6. Malnutrition, unspecified type (HCC)  - feeding supplement, ENSURE COMPLETE, (ENSURE COMPLETE) LIQD; Take 237 mLs by mouth 2 (two) times daily between meals. Two bottles daily  Dispense: 13272 mL; Refill: 12  Return in about 3 months (around 01/17/2023) for hypertension.    Nolon Nations  APRN, MSN, FNP-C Patient Care [facility] [address] [place], [place] [address] [phone]

## 2022-10-19 LAB — COMPREHENSIVE METABOLIC PANEL
ALT: 40 IU/L — ABNORMAL HIGH (ref 0–32)
AST: 26 IU/L (ref 0–40)
Albumin/Globulin Ratio: 1.9 (ref 1.2–2.2)
Albumin: 4.2 g/dL (ref 3.7–4.7)
Alkaline Phosphatase: 85 IU/L (ref 44–121)
BUN/Creatinine Ratio: 11 — ABNORMAL LOW (ref 12–28)
BUN: 10 mg/dL (ref 8–27)
Bilirubin Total: <0.2 mg/dL (ref 0.0–1.2)
CO2: 23 mmol/L (ref 20–29)
Calcium: 8.9 mg/dL (ref 8.7–10.3)
Chloride: 106 mmol/L (ref 96–106)
Creatinine, Ser: 0.91 mg/dL (ref 0.57–1.00)
Globulin, Total: 2.2 g/dL (ref 1.5–4.5)
Glucose: 124 mg/dL — ABNORMAL HIGH (ref 70–99)
Potassium: 3.7 mmol/L (ref 3.5–5.2)
Sodium: 143 mmol/L (ref 134–144)
Total Protein: 6.4 g/dL (ref 6.0–8.5)
eGFR: 61 mL/min/{1.73_m2} (ref >59)

## 2022-11-18 ENCOUNTER — Other Ambulatory Visit: Payer: Self-pay | Admitting: Family Medicine

## 2022-11-18 DIAGNOSIS — J4520 Mild intermittent asthma, uncomplicated: Secondary | ICD-10-CM

## 2022-11-18 DIAGNOSIS — E782 Mixed hyperlipidemia: Secondary | ICD-10-CM

## 2022-11-18 NOTE — Telephone Encounter (Signed)
Please advise in [place] absence.  

## 2022-12-15 DIAGNOSIS — H04123 Dry eye syndrome of bilateral lacrimal glands: Secondary | ICD-10-CM | POA: Diagnosis not present

## 2023-01-17 ENCOUNTER — Ambulatory Visit (INDEPENDENT_AMBULATORY_CARE_PROVIDER_SITE_OTHER): Payer: Medicare PPO | Admitting: Family Medicine

## 2023-01-17 ENCOUNTER — Other Ambulatory Visit: Payer: Self-pay | Admitting: Family Medicine

## 2023-01-17 DIAGNOSIS — R3915 Urgency of urination: Secondary | ICD-10-CM | POA: Diagnosis not present

## 2023-01-17 DIAGNOSIS — J4520 Mild intermittent asthma, uncomplicated: Secondary | ICD-10-CM

## 2023-01-17 DIAGNOSIS — I10 Essential (primary) hypertension: Secondary | ICD-10-CM

## 2023-01-17 DIAGNOSIS — R319 Hematuria, unspecified: Secondary | ICD-10-CM | POA: Diagnosis not present

## 2023-01-17 DIAGNOSIS — R634 Abnormal weight loss: Secondary | ICD-10-CM

## 2023-01-17 DIAGNOSIS — J302 Other seasonal allergic rhinitis: Secondary | ICD-10-CM

## 2023-01-17 LAB — POCT URINALYSIS DIP (PROADVANTAGE DEVICE)
Bilirubin, UA: NEGATIVE
Glucose, UA: NEGATIVE mg/dL
Ketones, POC UA: NEGATIVE mg/dL
Leukocytes, UA: NEGATIVE
Nitrite, UA: NEGATIVE
Protein Ur, POC: NEGATIVE mg/dL
Specific Gravity, Urine: 1.015
Urobilinogen, Ur: 0.2
pH, UA: 8.5 — AB (ref 5.0–8.0)

## 2023-01-17 MED ORDER — FLUTICASONE-SALMETEROL 100-50 MCG/ACT IN AEPB
1.0000 | INHALATION_SPRAY | Freq: Two times a day (BID) | RESPIRATORY_TRACT | 5 refills | Status: DC
Start: 2023-01-17 — End: 2023-07-28

## 2023-01-17 MED ORDER — TRIAMTERENE-HCTZ 37.5-25 MG PO TABS
0.5000 | ORAL_TABLET | Freq: Every day | ORAL | 1 refills | Status: DC
Start: 2023-01-17 — End: 2024-01-16

## 2023-01-17 NOTE — Patient Instructions (Signed)
Will follow up by phone with ultra sound location.  Will follow up by phone with an abnormal laboratory

## 2023-01-17 NOTE — Progress Notes (Signed)
Established Patient Office Visit  Subjective   Patient ID: Sarah Valenzuela, female    DOB: 1932/02/07  Age: 89 y.o. MRN: [id number]  Chief Complaint  Patient presents with   Hypertension    Follow up   uti symptoms     Some times feels like she has to go and not able to    [name] is an 89 year old female with a medical history significant for hypertension, hyperlipidemia, mild intermittent asthma, and anxiety presents for follow-up of chronic conditions.   Patient remains active with her church and in the community.  She has been walking to her mailbox daily. Today, [name] reports some urinary frequency and urgency over the past several weeks.  She has not had any hematuria or dysuria.  She has not noticed any abnormal urinary smell.  No lower back or abdominal pain. Patient has a history of hypertension.  She does not check her blood pressure at home.  She has been taking all medications consistently.  She has been following low-sodium diet.  Patient denies any chest pain, shortness of breath, or lower extremity swelling.    Patient Active Problem List   Diagnosis Date Noted   Asthma 05/04/2015   Medicare annual wellness visit, subsequent 08/20/2014   Vaginal dryness 06/02/2014   Situational depression 05/30/2014   Anxiety state 05/30/2014   Vaginal itching 11/14/2012   Occasional tremors 11/14/2012   HTN (hypertension) 11/14/2012   Hyperlipidemia 11/14/2012   Adjustment disorder with mixed anxiety and depressed mood 11/14/2012   Seasonal allergies 11/14/2012   Past Medical History:  Diagnosis Date   Asthma    Hypercholesteremia    Hyperlipidemia    Hypertension    Osteoporosis 02/2017   T score -3.1   Situational stress    No past surgical history on file. Social History   Tobacco Use   Smoking status: Never   Smokeless tobacco: Never  Vaping Use   Vaping status: Never Used  Substance Use Topics   Alcohol use: No   Drug use: No   Social  History   Socioeconomic History   Marital status: Widowed    Spouse name: Not on file   Number of children: Not on file   Years of education: Not on file   Highest education level: Not on file  Occupational History   Not on file  Tobacco Use   Smoking status: Never   Smokeless tobacco: Never  Vaping Use   Vaping status: Never Used  Substance and Sexual Activity   Alcohol use: No   Drug use: No   Sexual activity: Not Currently    Comment: declined insurance questions  Other Topics Concern   Not on file  Social History Narrative   Not on file   Social Determinants of Health   Financial Resource Strain: Low Risk  (08/04/2022)   Overall Financial Resource Strain (CARDIA)    Difficulty of Paying Living Expenses: Not hard at all  Food Insecurity: No Food Insecurity (08/04/2022)   Hunger Vital Sign    Worried About Running Out of Food in the Last Year: Never true    Ran Out of Food in the Last Year: Never true  Transportation Needs: No Transportation Needs (08/04/2022)   PRAPARE - Administrator, Civil Service (Medical): No    Lack of Transportation (Non-Medical): No  Physical Activity: Inactive (08/04/2022)   Exercise Vital Sign    Days of Exercise per Week: 0 days    Minutes  of Exercise per Session: 0 min  Stress: No Stress Concern Present (08/04/2022)   [employer] of Occupational Health - Occupational Stress Questionnaire    Feeling of Stress : Not at all  Social Connections: Moderately Integrated (08/04/2022)   Social Connection and Isolation Panel [NHANES]    Frequency of Communication with Friends and Family: More than three times a week    Frequency of Social Gatherings with Friends and Family: More than three times a week    Attends Religious Services: More than 4 times per year    Active Member of [employer] or Organizations: Yes    Attends Banker Meetings: More than 4 times per year    Marital Status: Widowed  Intimate Partner Violence: Not At  Risk (08/04/2022)   Humiliation, Afraid, Rape, and Kick questionnaire    Fear of Current or Ex-Partner: No    Emotionally Abused: No    Physically Abused: No    Sexually Abused: No   Family Status  Relation Name Status   Brother  (Not Specified)   Sister  (Not Specified)   Sister  (Not Specified)  No partnership data on file   Family History  Problem Relation Age of Onset   Cancer Brother        Prostate   Breast cancer Sister        UNSURE   Diabetes Sister    Heart disease Sister    Allergies  Allergen Reactions   Fish Allergy    Penicillins Other (See Comments)    Breaks out. Longtime allergy   Onion       Review of Systems  Constitutional: Negative.   HENT: Negative.    Eyes: Negative.   Respiratory: Negative.    Cardiovascular: Negative.   Gastrointestinal: Negative.   Genitourinary:  Positive for urgency.  Musculoskeletal:  Positive for back pain and joint pain.  Skin: Negative.   Neurological: Negative.   Psychiatric/Behavioral: Negative.        Objective:     BP (!) 136/50   Pulse 82   Temp (!) 97.2 F (36.2 C)   Wt 107 lb 3.2 oz (48.6 kg)   SpO2 98%   BMI 23.20 kg/m  BP Readings from Last 3 Encounters:  01/17/23 (!) 136/50  10/18/22 134/65  08/15/22 (!) 136/52   Wt Readings from Last 3 Encounters:  01/17/23 107 lb 3.2 oz (48.6 kg)  10/18/22 107 lb 9.6 oz (48.8 kg)  08/04/22 109 lb (49.4 kg)      Physical Exam Constitutional:      Appearance: Normal appearance.  Eyes:     Pupils: Pupils are equal, round, and reactive to light.  Cardiovascular:     Rate and Rhythm: Normal rate and regular rhythm.  Pulmonary:     Effort: Pulmonary effort is normal.  Abdominal:     General: Bowel sounds are normal.  Musculoskeletal:        General: Normal range of motion.  Skin:    General: Skin is warm.  Neurological:     General: No focal deficit present.     Mental Status: She is alert. Mental status is at baseline.  Psychiatric:         Mood and Affect: Mood normal.        Behavior: Behavior normal.        Thought Content: Thought content normal.        Judgment: Judgment normal.      Results for orders placed or performed  in visit on 01/17/23  Urine Culture   Specimen: Urine   UR  Result Value Ref Range   Urine Culture, Routine Final report    Organism ID, Bacteria Comment   CMP and Liver  Result Value Ref Range   Glucose 88 70 - 99 mg/dL   BUN 8 8 - 27 mg/dL   Creatinine, Ser 4.40 0.57 - 1.00 mg/dL   eGFR 84 >34 VQ/QVZ/5.63   Sodium 133 (L) 134 - 144 mmol/L   Potassium 3.9 3.5 - 5.2 mmol/L   Chloride 96 96 - 106 mmol/L   CO2 24 20 - 29 mmol/L   Calcium 9.9 8.7 - 10.3 mg/dL   Total Protein 7.1 6.0 - 8.5 g/dL   Albumin 4.6 3.7 - 4.7 g/dL   Bilirubin Total 0.4 0.0 - 1.2 mg/dL   Bilirubin, Direct 8.75 0.00 - 0.40 mg/dL   Alkaline Phosphatase 61 44 - 121 IU/L   AST 34 0 - 40 IU/L   ALT 17 0 - 32 IU/L  POCT Urinalysis DIP (Proadvantage Device)  Result Value Ref Range   Color, UA yellow yellow   Clarity, UA clear clear   Glucose, UA negative negative mg/dL   Bilirubin, UA negative negative   Ketones, POC UA negative negative mg/dL   Specific Gravity, Urine 1.015    Blood, UA trace-intact (A) negative   pH, UA 8.5 (A) 5.0 - 8.0   Protein Ur, POC negative negative mg/dL   Urobilinogen, Ur 0.2    Nitrite, UA Negative Negative   Leukocytes, UA Negative Negative    Last CBC Lab Results  Component Value Date   WBC 7.0 02/07/2019   HGB 12.1 02/07/2019   HCT 35.8 06/22/2021   MCV 94 02/07/2019   MCH 30.9 02/07/2019   RDW 12.3 02/07/2019   PLT 218 02/07/2019   Last metabolic panel Lab Results  Component Value Date   GLUCOSE 88 01/17/2023   NA 133 (L) 01/17/2023   K 3.9 01/17/2023   CL 96 01/17/2023   CO2 24 01/17/2023   BUN 8 01/17/2023   CREATININE 0.66 01/17/2023   EGFR 84 01/17/2023   CALCIUM 9.9 01/17/2023   PROT 7.1 01/17/2023   ALBUMIN 4.6 01/17/2023   LABGLOB 2.2 10/18/2022    AGRATIO 1.9 10/18/2022   BILITOT 0.4 01/17/2023   ALKPHOS 61 01/17/2023   AST 34 01/17/2023   ALT 17 01/17/2023   Last lipids Lab Results  Component Value Date   CHOL 168 01/04/2022   HDL 105 01/04/2022   LDLCALC 53 01/04/2022   TRIG 43 01/04/2022   CHOLHDL 1.6 01/04/2022   Last hemoglobin A1c Lab Results  Component Value Date   HGBA1C 5.1 04/22/2016   Last thyroid functions Lab Results  Component Value Date   TSH 1.75 10/17/2012   Last vitamin D Lab Results  Component Value Date   VD25OH 52 05/07/2014   Last vitamin B12 and Folate Lab Results  Component Value Date   VITAMINB12 1,066 07/12/2022   FOLATE >20.0 02/11/2013      The ASCVD Risk score (Arnett DK, et al., 2019) failed to calculate for the following reasons:   The 2019 ASCVD risk score is only valid for ages 7 to 33    Assessment & Plan:   Problem List Items Addressed This Visit       Respiratory   Asthma   Relevant Medications   fluticasone-salmeterol (ADVAIR) 100-50 MCG/ACT AEPB   Other Visit Diagnoses     Urgency of  urination       Relevant Orders   Urine Culture (Completed)   POCT Urinalysis DIP (Proadvantage Device) (Completed)   Hematuria, unspecified type       Relevant Orders   Urine Culture (Completed)   US Renal (Completed)   Weight loss, non-intentional       Essential hypertension       Relevant Medications   triamterene-hydrochlorothiazide (MAXZIDE-25) 37.5-25 MG tablet   Other Relevant Orders   CMP and Liver (Completed)      1. Urgency of urination  - Urine Culture - POCT Urinalysis DIP (Proadvantage Device)  2. Hematuria, unspecified type Review urinalysis, trace blood.  Will review renal ultrasound due to current symptoms.  Will follow-up with patient after reviewing ultrasound results. - Urine Culture - US Renal; Future  3. Weight loss, non-intentional Recommend that patient continues boost/Ensure.  4. Essential hypertension  -  triamterene-hydrochlorothiazide (MAXZIDE-25) 37.5-25 MG tablet; Take 0.5 tablets by mouth daily.  Dispense: 90 tablet; Refill: 1 - CMP and Liver  5. Mild intermittent asthma without complication Stable.  No asthma exacerbation in recent months.  No medication changes warranted. - fluticasone-salmeterol (ADVAIR) 100-50 MCG/ACT AEPB; Inhale 1 puff into the lungs 2 (two) times daily.  Dispense: 60 each; Refill: 5  Return in about 3 months (around 04/19/2023) for hypertension.   Nolon Nations  APRN, MSN, FNP-C Patient Care [facility] [address] [place], [place] [address] [phone]

## 2023-01-18 LAB — CMP AND LIVER
ALT: 17 IU/L (ref 0–32)
AST: 34 IU/L (ref 0–40)
Albumin: 4.6 g/dL (ref 3.7–4.7)
Alkaline Phosphatase: 61 IU/L (ref 44–121)
BUN: 8 mg/dL (ref 8–27)
Bilirubin Total: 0.4 mg/dL (ref 0.0–1.2)
Bilirubin, Direct: 0.14 mg/dL (ref 0.00–0.40)
CO2: 24 mmol/L (ref 20–29)
Calcium: 9.9 mg/dL (ref 8.7–10.3)
Chloride: 96 mmol/L (ref 96–106)
Creatinine, Ser: 0.66 mg/dL (ref 0.57–1.00)
Glucose: 88 mg/dL (ref 70–99)
Potassium: 3.9 mmol/L (ref 3.5–5.2)
Sodium: 133 mmol/L — ABNORMAL LOW (ref 134–144)
Total Protein: 7.1 g/dL (ref 6.0–8.5)
eGFR: 84 mL/min/{1.73_m2} (ref >59)

## 2023-01-19 LAB — URINE CULTURE

## 2023-01-20 ENCOUNTER — Telehealth: Payer: Self-pay | Admitting: Family Medicine

## 2023-01-20 NOTE — Telephone Encounter (Signed)
Pt's daughter called and stated on an explanation on why she is getting and ULTRA Sound

## 2023-01-24 ENCOUNTER — Ambulatory Visit (HOSPITAL_COMMUNITY)
Admission: RE | Admit: 2023-01-24 | Discharge: 2023-01-24 | Disposition: A | Discharge status: Home / Self Care | Payer: Medicare PPO | Source: Ambulatory Visit | Attending: Family Medicine | Admitting: Family Medicine

## 2023-01-24 DIAGNOSIS — N1330 Unspecified hydronephrosis: Secondary | ICD-10-CM | POA: Diagnosis not present

## 2023-01-24 DIAGNOSIS — R319 Hematuria, unspecified: Secondary | ICD-10-CM | POA: Insufficient documentation

## 2023-01-24 NOTE — Telephone Encounter (Signed)
Lvm for pt daughter to call back . I will not be able to share pt info due to her daughter not being on her HIPAA forms. If she calls back please advise her to call with mother on the line . KH

## 2023-01-26 ENCOUNTER — Telehealth: Payer: Self-pay | Admitting: Family Medicine

## 2023-01-26 DIAGNOSIS — I2510 Atherosclerotic heart disease of native coronary artery without angina pectoris: Secondary | ICD-10-CM | POA: Diagnosis not present

## 2023-01-26 DIAGNOSIS — I340 Nonrheumatic mitral (valve) insufficiency: Secondary | ICD-10-CM | POA: Diagnosis not present

## 2023-01-26 DIAGNOSIS — E782 Mixed hyperlipidemia: Secondary | ICD-10-CM | POA: Diagnosis not present

## 2023-01-26 DIAGNOSIS — I10 Essential (primary) hypertension: Secondary | ICD-10-CM | POA: Diagnosis not present

## 2023-01-26 NOTE — Telephone Encounter (Signed)
Sarah Valenzuela is an 89 year old female that presented previously for routine follow-up.  Patient was complaining of urinary urgency and on urinalysis there was trace blood present.  Patient underwent renal ultrasound, which is negative.  Spoke with patient's daughter [name] about results.  Attempted to call patient, will try back at a later time.  Nolon Nations  APRN, MSN, FNP-C Patient Care [facility] [address] [place], [place] [address] [phone]

## 2023-01-30 ENCOUNTER — Telehealth: Payer: Self-pay

## 2023-01-30 NOTE — Telephone Encounter (Signed)
Pt just wanted to call and say thank you for your call. She really appreciates you breaking everything down  for her .   Renelda Loma RMA

## 2023-02-01 ENCOUNTER — Ambulatory Visit: Payer: Self-pay | Admitting: Nurse Practitioner

## 2023-02-21 DIAGNOSIS — Z961 Presence of intraocular lens: Secondary | ICD-10-CM | POA: Diagnosis not present

## 2023-02-21 DIAGNOSIS — H35363 Drusen (degenerative) of macula, bilateral: Secondary | ICD-10-CM | POA: Diagnosis not present

## 2023-02-21 DIAGNOSIS — H40023 Open angle with borderline findings, high risk, bilateral: Secondary | ICD-10-CM | POA: Diagnosis not present

## 2023-02-21 DIAGNOSIS — H04123 Dry eye syndrome of bilateral lacrimal glands: Secondary | ICD-10-CM | POA: Diagnosis not present

## 2023-02-23 ENCOUNTER — Other Ambulatory Visit: Payer: Self-pay | Admitting: Family Medicine

## 2023-02-23 DIAGNOSIS — J4520 Mild intermittent asthma, uncomplicated: Secondary | ICD-10-CM

## 2023-02-23 DIAGNOSIS — E782 Mixed hyperlipidemia: Secondary | ICD-10-CM

## 2023-04-18 ENCOUNTER — Ambulatory Visit: Payer: Self-pay | Admitting: Family Medicine

## 2023-04-27 ENCOUNTER — Encounter: Payer: Self-pay | Admitting: Family Medicine

## 2023-04-27 ENCOUNTER — Ambulatory Visit: Payer: Medicare PPO | Admitting: Family Medicine

## 2023-04-27 VITALS — BP 152/66 | HR 86 | Temp 97.0°F | Wt 109.0 lb

## 2023-04-27 DIAGNOSIS — J4520 Mild intermittent asthma, uncomplicated: Secondary | ICD-10-CM | POA: Diagnosis not present

## 2023-04-27 DIAGNOSIS — I10 Essential (primary) hypertension: Secondary | ICD-10-CM | POA: Diagnosis not present

## 2023-04-27 DIAGNOSIS — E785 Hyperlipidemia, unspecified: Secondary | ICD-10-CM

## 2023-04-27 DIAGNOSIS — E782 Mixed hyperlipidemia: Secondary | ICD-10-CM | POA: Diagnosis not present

## 2023-04-27 NOTE — Progress Notes (Signed)
Established Patient Office Visit  Subjective   Patient ID: Sarah Valenzuela, female    DOB: 01/04/1932  Age: 89 y.o. MRN: [id number]  Chief Complaint  Patient presents with   Hypertension    [name] is an 89 year old female with a medical history significant for essential hypertension, hyperlipidemia, and mild intermittent asthma presents for follow-up of chronic conditions.  Ms. Homsher has been doing well and does not have any complaints today.  Patient has been taking all medications consistently.  She has remained active with her church and exercises several times per week. Patient typically follows up every 3 months for hypertension.  Hypertension has been very well-controlled on current medication regimen.  She does not check her blood pressures at home.  Patient prepares her own meals.  She currently denies any lower extremity swelling, heart palpitations, shortness of breath, or orthopnea.   Hypertension    Patient Active Problem List   Diagnosis Date Noted   Asthma 05/04/2015   Medicare annual wellness visit, subsequent 08/20/2014   Vaginal dryness 06/02/2014   Situational depression 05/30/2014   Anxiety state 05/30/2014   Vaginal itching 11/14/2012   Occasional tremors 11/14/2012   HTN (hypertension) 11/14/2012   Hyperlipidemia 11/14/2012   Adjustment disorder with mixed anxiety and depressed mood 11/14/2012   Seasonal allergies 11/14/2012   Past Medical History:  Diagnosis Date   Asthma    Hypercholesteremia    Hyperlipidemia    Hypertension    Osteoporosis 02/2017   T score -3.1   Situational stress    History reviewed. No pertinent surgical history. Social History   Tobacco Use   Smoking status: Never   Smokeless tobacco: Never  Vaping Use   Vaping status: Never Used  Substance Use Topics   Alcohol use: No   Drug use: No   Social History   Socioeconomic History   Marital status: Widowed    Spouse name: Not on file   Number of  children: Not on file   Years of education: Not on file   Highest education level: Not on file  Occupational History   Not on file  Tobacco Use   Smoking status: Never   Smokeless tobacco: Never  Vaping Use   Vaping status: Never Used  Substance and Sexual Activity   Alcohol use: No   Drug use: No   Sexual activity: Not Currently    Comment: declined insurance questions  Other Topics Concern   Not on file  Social History Narrative   Not on file   Social Determinants of Health   Financial Resource Strain: Low Risk  (08/04/2022)   Overall Financial Resource Strain (CARDIA)    Difficulty of Paying Living Expenses: Not hard at all  Food Insecurity: No Food Insecurity (08/04/2022)   Hunger Vital Sign    Worried About Running Out of Food in the Last Year: Never true    Ran Out of Food in the Last Year: Never true  Transportation Needs: No Transportation Needs (08/04/2022)   PRAPARE - Administrator, Civil Service (Medical): No    Lack of Transportation (Non-Medical): No  Physical Activity: Inactive (08/04/2022)   Exercise Vital Sign    Days of Exercise per Week: 0 days    Minutes of Exercise per Session: 0 min  Stress: No Stress Concern Present (08/04/2022)   [employer] of Occupational Health - Occupational Stress Questionnaire    Feeling of Stress : Not at all  Social Connections: Moderately Integrated (08/04/2022)  Social Advertising account executive [NHANES]    Frequency of Communication with Friends and Family: More than three times a week    Frequency of Social Gatherings with Friends and Family: More than three times a week    Attends Religious Services: More than 4 times per year    Active Member of [employer] or Organizations: Yes    Attends Banker Meetings: More than 4 times per year    Marital Status: Widowed  Intimate Partner Violence: Not At Risk (08/04/2022)   Humiliation, Afraid, Rape, and Kick questionnaire    Fear of Current or Ex-Partner:  No    Emotionally Abused: No    Physically Abused: No    Sexually Abused: No   Family Status  Relation Name Status   Brother  (Not Specified)   Sister  (Not Specified)   Sister  (Not Specified)  No partnership data on file   Family History  Problem Relation Age of Onset   Cancer Brother        Prostate   Breast cancer Sister        UNSURE   Diabetes Sister    Heart disease Sister    Allergies  Allergen Reactions   Fish Allergy    Penicillins Other (See Comments)    Breaks out. Longtime allergy   Onion       Review of Systems  Constitutional:  Negative for chills and fever.  HENT: Negative.    Eyes: Negative.   Respiratory: Negative.    Cardiovascular: Negative.   Gastrointestinal: Negative.   Genitourinary: Negative.   Musculoskeletal: Negative.   Skin: Negative.   Neurological: Negative.   Psychiatric/Behavioral: Negative.        Objective:     BP (!) 152/66   Pulse 86   Temp (!) 97 F (36.1 C)   Wt 109 lb (49.4 kg)   SpO2 100%   BMI 23.59 kg/m  BP Readings from Last 3 Encounters:  04/27/23 (!) 152/66  01/17/23 (!) 136/50  10/18/22 134/65   Wt Readings from Last 3 Encounters:  04/27/23 109 lb (49.4 kg)  01/17/23 107 lb 3.2 oz (48.6 kg)  10/18/22 107 lb 9.6 oz (48.8 kg)      Physical Exam Constitutional:      Appearance: Normal appearance.  Eyes:     Pupils: Pupils are equal, round, and reactive to light.  Cardiovascular:     Rate and Rhythm: Normal rate and regular rhythm.     Pulses: Normal pulses.  Pulmonary:     Effort: Pulmonary effort is normal.     Breath sounds: Normal breath sounds.  Abdominal:     General: Bowel sounds are normal.  Musculoskeletal:        General: Normal range of motion.  Skin:    General: Skin is warm.  Psychiatric:        Mood and Affect: Mood normal.        Behavior: Behavior normal.        Thought Content: Thought content normal.        Judgment: Judgment normal.      No results found for any  visits on 04/27/23.  Last CBC Lab Results  Component Value Date   WBC 7.0 02/07/2019   HGB 12.1 02/07/2019   HCT 35.8 06/22/2021   MCV 94 02/07/2019   MCH 30.9 02/07/2019   RDW 12.3 02/07/2019   PLT 218 02/07/2019   Last metabolic panel Lab Results  Component Value Date  GLUCOSE 88 01/17/2023   NA 133 (L) 01/17/2023   K 3.9 01/17/2023   CL 96 01/17/2023   CO2 24 01/17/2023   BUN 8 01/17/2023   CREATININE 0.66 01/17/2023   EGFR 84 01/17/2023   CALCIUM 9.9 01/17/2023   PROT 7.1 01/17/2023   ALBUMIN 4.6 01/17/2023   LABGLOB 2.2 10/18/2022   AGRATIO 1.9 10/18/2022   BILITOT 0.4 01/17/2023   ALKPHOS 61 01/17/2023   AST 34 01/17/2023   ALT 17 01/17/2023   Last lipids Lab Results  Component Value Date   CHOL 168 01/04/2022   HDL 105 01/04/2022   LDLCALC 53 01/04/2022   TRIG 43 01/04/2022   CHOLHDL 1.6 01/04/2022   Last hemoglobin A1c Lab Results  Component Value Date   HGBA1C 5.1 04/22/2016   Last thyroid functions Lab Results  Component Value Date   TSH 1.75 10/17/2012   Last vitamin D Lab Results  Component Value Date   VD25OH 52 05/07/2014   Last vitamin B12 and Folate Lab Results  Component Value Date   VITAMINB12 1,066 07/12/2022   FOLATE >20.0 02/11/2013      The ASCVD Risk score (Arnett DK, et al., 2019) failed to calculate for the following reasons:   The 2019 ASCVD risk score is only valid for ages 3 to 38    Assessment & Plan:   Problem List Items Addressed This Visit       Respiratory   Asthma     Other   Hyperlipidemia (Chronic)   Other Visit Diagnoses     Essential hypertension    -  Primary      1. Essential hypertension BP (!) 152/66   Pulse 86   Temp (!) 97 F (36.1 C)   Wt 109 lb (49.4 kg)   SpO2 100%   BMI 23.59 kg/m  - Continue medication, monitor blood pressure at home. Continue DASH diet.  Reminder to go to the ER if any CP, SOB, nausea, dizziness, severe HA, changes vision/speech, left arm numbness and  tingling and jaw pain.   - Basic Metabolic Panel  2. Mild intermittent asthma without complication Stable.  Continue home medications as needed.  3. Hyperlipidemia, unspecified hyperlipidemia type Reviewed previous values, within normal range.  - Lipid Panel    Follow-up in 3 months as scheduled.  Nolon Nations  APRN, MSN, FNP-C Patient Care [facility] [address] [place], [place] [address] [phone]

## 2023-04-28 LAB — BASIC METABOLIC PANEL
BUN/Creatinine Ratio: 15 (ref 12–28)
BUN: 10 mg/dL (ref 8–27)
CO2: 25 mmol/L (ref 20–29)
Calcium: 9.6 mg/dL (ref 8.7–10.3)
Chloride: 93 mmol/L — ABNORMAL LOW (ref 96–106)
Creatinine, Ser: 0.65 mg/dL (ref 0.57–1.00)
Glucose: 96 mg/dL (ref 70–99)
Potassium: 4.1 mmol/L (ref 3.5–5.2)
Sodium: 133 mmol/L — ABNORMAL LOW (ref 134–144)
eGFR: 84 mL/min/{1.73_m2} (ref >59)

## 2023-04-28 LAB — LIPID PANEL
Chol/HDL Ratio: 1.7 ratio (ref 0.0–4.4)
Cholesterol, Total: 167 mg/dL (ref 100–199)
HDL: 100 mg/dL (ref >39)
LDL Chol Calc (NIH): 56 mg/dL (ref 0–99)
Triglycerides: 52 mg/dL (ref 0–149)
VLDL Cholesterol Cal: 11 mg/dL (ref 5–40)

## 2023-05-23 ENCOUNTER — Other Ambulatory Visit: Payer: Self-pay | Admitting: Family Medicine

## 2023-05-23 DIAGNOSIS — J4520 Mild intermittent asthma, uncomplicated: Secondary | ICD-10-CM

## 2023-05-23 DIAGNOSIS — E782 Mixed hyperlipidemia: Secondary | ICD-10-CM

## 2023-07-20 DIAGNOSIS — I2510 Atherosclerotic heart disease of native coronary artery without angina pectoris: Secondary | ICD-10-CM | POA: Diagnosis not present

## 2023-07-20 DIAGNOSIS — I340 Nonrheumatic mitral (valve) insufficiency: Secondary | ICD-10-CM | POA: Diagnosis not present

## 2023-07-20 DIAGNOSIS — I10 Essential (primary) hypertension: Secondary | ICD-10-CM | POA: Diagnosis not present

## 2023-07-20 DIAGNOSIS — E782 Mixed hyperlipidemia: Secondary | ICD-10-CM | POA: Diagnosis not present

## 2023-07-25 ENCOUNTER — Ambulatory Visit (INDEPENDENT_AMBULATORY_CARE_PROVIDER_SITE_OTHER): Payer: Medicare PPO | Admitting: Nurse Practitioner

## 2023-07-25 ENCOUNTER — Encounter: Payer: Self-pay | Admitting: Nurse Practitioner

## 2023-07-25 VITALS — BP 141/53 | HR 88 | Temp 97.5°F | Wt 106.0 lb

## 2023-07-25 DIAGNOSIS — I10 Essential (primary) hypertension: Secondary | ICD-10-CM

## 2023-07-25 DIAGNOSIS — J4520 Mild intermittent asthma, uncomplicated: Secondary | ICD-10-CM | POA: Diagnosis not present

## 2023-07-25 DIAGNOSIS — J302 Other seasonal allergic rhinitis: Secondary | ICD-10-CM | POA: Diagnosis not present

## 2023-07-25 DIAGNOSIS — R748 Abnormal levels of other serum enzymes: Secondary | ICD-10-CM | POA: Diagnosis not present

## 2023-07-25 DIAGNOSIS — E785 Hyperlipidemia, unspecified: Secondary | ICD-10-CM | POA: Diagnosis not present

## 2023-07-25 NOTE — Assessment & Plan Note (Signed)
Well-controlled on albuterol inhaler 2 puffs every 6 hours as needed Zafirlukast 20 mg daily Continue current medication

## 2023-07-25 NOTE — Assessment & Plan Note (Signed)
Continue Flonase nasal spray 2 spray daily, zafirlukast 20 mg daily

## 2023-07-25 NOTE — Assessment & Plan Note (Addendum)
Lab Results  Component Value Date   ALT 17 01/17/2023   AST 34 01/17/2023   ALKPHOS 61 01/17/2023   BILITOT 0.4 01/17/2023  Checking hepatic function panel as requested by cardiology

## 2023-07-25 NOTE — Assessment & Plan Note (Signed)
Lab Results  Component Value Date   CHOL 167 04/27/2023   HDL 100 04/27/2023   LDLCALC 56 04/27/2023   TRIG 52 04/27/2023   CHOLHDL 1.7 04/27/2023  Currently well-controlled on rosuvastatin 5 mg daily Continue current medication Checking lipid panel as requested by cardiology

## 2023-07-25 NOTE — Patient Instructions (Signed)

## 2023-07-25 NOTE — Assessment & Plan Note (Addendum)
Systolic BP is elevated but diastolic blood pressure is well-controlled Continue half tablet of triamterene-hydrochlorothiazide 37.5-25 mg daily, DASH diet and commitment to daily physical activity for a minimum of 30 minutes discussed and encouraged, as a part of hypertension management. BMP today      07/25/2023    9:58 AM 07/25/2023    9:47 AM 04/27/2023   10:05 AM 04/27/2023    9:58 AM 01/17/2023    9:51 AM 01/17/2023    9:46 AM 10/18/2022    9:44 AM  BP/Weight  Systolic BP 141 150 152 147 136 142 134  Diastolic BP 53 60 66 64 50 54 65  Wt. (Lbs)  106  109  107.2 107.6  BMI  22.94 kg/m2  23.59 kg/m2  23.2 kg/m2 23.28 kg/m2

## 2023-07-25 NOTE — Progress Notes (Signed)
New Patient Office Visit  Subjective:  Patient ID: Sarah Valenzuela, female    DOB: 1932/04/08  Age: 89 y.o. MRN: [id number]  CC:  Chief Complaint  Patient presents with   Establish Care   Hypertension   Hyperlipidemia    Fasting     HPI Sarah Valenzuela is a 89 y.o. female  has a past medical history of Asthma, Hypercholesteremia, Hyperlipidemia, Hypertension, Osteoporosis (02/2017), and Situational stress.  Patient presents for follow-up for her chronic medical conditions Stated that she is doing well generally, [facility].  Stated that she has been taking all her medications daily as ordered.  She lives home alone.  She had elevated to the cardiology recently and they would like her to have some labs done in this office today.     Wt Readings from Last 3 Encounters:  07/25/23 106 lb (48.1 kg)  04/27/23 109 lb (49.4 kg)  01/17/23 107 lb 3.2 oz (48.6 kg)    Past Medical History:  Diagnosis Date   Asthma    Hypercholesteremia    Hyperlipidemia    Hypertension    Osteoporosis 02/2017   T score -3.1   Situational stress     History reviewed. No pertinent surgical history.  Family History  Problem Relation Age of Onset   Cancer Brother        Prostate   Breast cancer Sister        UNSURE   Diabetes Sister    Heart disease Sister     Social History   Socioeconomic History   Marital status: Widowed    Spouse name: Not on file   Number of children: 1   Years of education: Not on file   Highest education level: Not on file  Occupational History   Not on file  Tobacco Use   Smoking status: Never   Smokeless tobacco: Never  Vaping Use   Vaping status: Never Used  Substance and Sexual Activity   Alcohol use: No   Drug use: No   Sexual activity: Not Currently    Comment: declined insurance questions  Other Topics Concern   Not on file  Social History Narrative   Lives home alone.  Daughter lives [place], has very good neighbors    Social Drivers of Corporate investment banker Strain: Low Risk  (08/04/2022)   Overall Financial Resource Strain (CARDIA)    Difficulty of Paying Living Expenses: Not hard at all  Food Insecurity: No Food Insecurity (08/04/2022)   Hunger Vital Sign    Worried About Running Out of Food in the Last Year: Never true    Ran Out of Food in the Last Year: Never true  Transportation Needs: No Transportation Needs (08/04/2022)   PRAPARE - Administrator, Civil Service (Medical): No    Lack of Transportation (Non-Medical): No  Physical Activity: Inactive (08/04/2022)   Exercise Vital Sign    Days of Exercise per Week: 0 days    Minutes of Exercise per Session: 0 min  Stress: No Stress Concern Present (08/04/2022)   [employer] of Occupational Health - Occupational Stress Questionnaire    Feeling of Stress : Not at all  Social Connections: Moderately Integrated (08/04/2022)   Social Connection and Isolation Panel [NHANES]    Frequency of Communication with Friends and Family: More than three times a week    Frequency of Social Gatherings  with Friends and Family: More than three times a week    Attends Religious Services: More than 4 times per year    Active Member of Clubs or Organizations: Yes    Attends Banker Meetings: More than 4 times per year    Marital Status: Widowed  Intimate Partner Violence: Not At Risk (08/04/2022)   Humiliation, Afraid, Rape, and Kick questionnaire    Fear of Current or Ex-Partner: No    Emotionally Abused: No    Physically Abused: No    Sexually Abused: No    ROS Review of Systems  Constitutional:  Negative for appetite change, chills, fatigue and fever.  HENT:  Negative for congestion, postnasal drip, rhinorrhea and sneezing.   Respiratory:  Negative for cough, shortness of breath and wheezing.   Cardiovascular:  Negative for chest pain, palpitations and leg swelling.   Gastrointestinal:  Negative for abdominal pain, constipation, nausea and vomiting.  Genitourinary:  Negative for difficulty urinating, dysuria, flank pain and frequency.  Musculoskeletal:  Negative for arthralgias, back pain, joint swelling and myalgias.  Skin:  Negative for color change, pallor, rash and wound.  Neurological:  Negative for dizziness, facial asymmetry, weakness, numbness and headaches.  Psychiatric/Behavioral:  Negative for behavioral problems, confusion, self-injury and suicidal ideas.     Objective:   Today's Vitals: BP (!) 141/53   Pulse 88   Temp (!) 97.5 F (36.4 C)   Wt 106 lb (48.1 kg)   SpO2 100%   BMI 22.94 kg/m   Physical Exam Vitals and nursing note reviewed.  Constitutional:      General: She is not in acute distress.    Appearance: Normal appearance. She is not ill-appearing, toxic-appearing or diaphoretic.  HENT:     Mouth/Throat:     Mouth: Mucous membranes are moist.     Pharynx: Oropharynx is clear. No oropharyngeal exudate or posterior oropharyngeal erythema.  Eyes:     General: No scleral icterus.       Right eye: No discharge.        Left eye: No discharge.     Extraocular Movements: Extraocular movements intact.     Conjunctiva/sclera: Conjunctivae normal.  Cardiovascular:     Rate and Rhythm: Normal rate and regular rhythm.     Pulses: Normal pulses.     Heart sounds: Normal heart sounds. No murmur heard.    No friction rub. No gallop.  Pulmonary:     Effort: Pulmonary effort is normal. No respiratory distress.     Breath sounds: Normal breath sounds. No stridor. No wheezing, rhonchi or rales.  Chest:     Chest wall: No tenderness.  Abdominal:     General: There is no distension.     Palpations: Abdomen is soft.     Tenderness: There is no abdominal tenderness. There is no right CVA tenderness, left CVA tenderness or guarding.  Musculoskeletal:        General: No swelling, tenderness, deformity or signs of injury.     Right  lower leg: No edema.     Left lower leg: No edema.  Skin:    General: Skin is warm and dry.     Capillary Refill: Capillary refill takes less than 2 seconds.     Coloration: Skin is not jaundiced or pale.     Findings: No bruising, erythema or lesion.  Neurological:     Mental Status: She is alert and oriented to person, place, and time.     Motor: No  weakness.     Coordination: Coordination normal.     Gait: Gait normal.  Psychiatric:        Mood and Affect: Mood normal.        Behavior: Behavior normal.        Thought Content: Thought content normal.        Judgment: Judgment normal.     Assessment & Plan:   Problem List Items Addressed This Visit       Cardiovascular and Mediastinum   HTN (hypertension)   Systolic BP is elevated but diastolic blood pressure is well-controlled Continue half tablet of triamterene-hydrochlorothiazide 37.5-25 mg daily, DASH diet and commitment to daily physical activity for a minimum of 30 minutes discussed and encouraged, as a part of hypertension management. BMP today      07/25/2023    9:58 AM 07/25/2023    9:47 AM 04/27/2023   10:05 AM 04/27/2023    9:58 AM 01/17/2023    9:51 AM 01/17/2023    9:46 AM 10/18/2022    9:44 AM  BP/Weight  Systolic BP 141 150 152 147 136 142 134  Diastolic BP 53 60 66 64 50 54 65  Wt. (Lbs)  106  109  107.2 107.6  BMI  22.94 kg/m2  23.59 kg/m2  23.2 kg/m2 23.28 kg/m2           Relevant Orders   Basic Metabolic Panel     Respiratory   Asthma   Well-controlled on albuterol inhaler 2 puffs every 6 hours as needed Zafirlukast 20 mg daily Continue current medication        Other   Hyperlipidemia - Primary (Chronic)   Lab Results  Component Value Date   CHOL 167 04/27/2023   HDL 100 04/27/2023   LDLCALC 56 04/27/2023   TRIG 52 04/27/2023   CHOLHDL 1.7 04/27/2023  Currently well-controlled on rosuvastatin 5 mg daily Continue current medication Checking lipid panel as requested by  cardiology      Relevant Orders   Lipid panel   Seasonal allergies   Continue Flonase nasal spray 2 spray daily, zafirlukast 20 mg daily      Elevated liver enzymes   Lab Results  Component Value Date   ALT 17 01/17/2023   AST 34 01/17/2023   ALKPHOS 61 01/17/2023   BILITOT 0.4 01/17/2023  Checking hepatic function panel as requested by cardiology      Relevant Orders   Hepatic Function Panel    Outpatient Encounter Medications as of 07/25/2023  Medication Sig   acetaminophen (TYLENOL) 500 MG tablet Take 1 tablet (500 mg total) by mouth every 6 (six) hours as needed for pain.   albuterol (VENTOLIN HFA) 108 (90 Base) MCG/ACT inhaler INHALE TWO PUFFS INTO THE LUNGS EVERY 6 HOURS FOR WHEEZING OR SHORTNESS OF BREATH   aspirin 81 MG chewable tablet Chew 81 mg by mouth daily.   calcium citrate (CALCITRATE - DOSED IN MG ELEMENTAL CALCIUM) 950 MG tablet Take 1 tablet by mouth 2 (two) times daily.   cetirizine (ZYRTEC) 10 MG tablet Take 1 tablet (10 mg total) by mouth daily.   Cholecalciferol (VITAMIN D3) 125 MCG (5000 UT) CAPS Take 1 capsule by mouth daily.   clobetasol ointment (TEMOVATE) 0.05 % APPLY TWICE A WEEK, LONG TERM.   cycloSPORINE (RESTASIS) 0.05 % ophthalmic emulsion Place 1 drop into both eyes 2 (two) times daily.   feeding supplement, ENSURE COMPLETE, (ENSURE COMPLETE) LIQD Take 237 mLs by mouth 2 (two) times daily between meals.  Two bottles daily   fluticasone (FLONASE) 50 MCG/ACT nasal spray Place 2 sprays into both nostrils daily.   fluticasone-salmeterol (ADVAIR) 100-50 MCG/ACT AEPB Inhale 1 puff into the lungs 2 (two) times daily.   Multiple Vitamins-Minerals (AIRBORNE PO) Take by mouth.   rosuvastatin (CRESTOR) 5 MG tablet TAKE ONE TABLET BY MOUTH DAILY   SENNOSIDES PO Take 8.5 mg by mouth as needed (Swiss Kriss OTC Laxative).   triamterene-hydrochlorothiazide (MAXZIDE-25) 37.5-25 MG tablet Take 0.5 tablets by mouth daily.   zafirlukast (ACCOLATE) 20 MG tablet TAKE  1 TABLET BY MOUTH TWICE DAILY.   No facility-administered encounter medications on file as of 07/25/2023.    Follow-up: Return in about 4 months (around 11/22/2023) for HTN, HYPERLIPIDEMIA.   Donell Beers, FNP

## 2023-07-26 LAB — BASIC METABOLIC PANEL
BUN/Creatinine Ratio: 21 (ref 12–28)
BUN: 13 mg/dL (ref 8–27)
CO2: 23 mmol/L (ref 20–29)
Calcium: 9.3 mg/dL (ref 8.7–10.3)
Chloride: 96 mmol/L (ref 96–106)
Creatinine, Ser: 0.62 mg/dL (ref 0.57–1.00)
Glucose: 93 mg/dL (ref 70–99)
Potassium: 3.8 mmol/L (ref 3.5–5.2)
Sodium: 135 mmol/L (ref 134–144)
eGFR: 85 mL/min/{1.73_m2} (ref >59)

## 2023-07-26 LAB — HEPATIC FUNCTION PANEL
ALT: 16 [IU]/L (ref 0–32)
AST: 28 [IU]/L (ref 0–40)
Albumin: 4.4 g/dL (ref 3.7–4.7)
Alkaline Phosphatase: 57 [IU]/L (ref 44–121)
Bilirubin Total: 0.4 mg/dL (ref 0.0–1.2)
Bilirubin, Direct: 0.16 mg/dL (ref 0.00–0.40)
Total Protein: 7 g/dL (ref 6.0–8.5)

## 2023-07-26 LAB — LIPID PANEL
Chol/HDL Ratio: 1.7 {ratio} (ref 0.0–4.4)
Cholesterol, Total: 158 mg/dL (ref 100–199)
HDL: 94 mg/dL (ref >39)
LDL Chol Calc (NIH): 55 mg/dL (ref 0–99)
Triglycerides: 39 mg/dL (ref 0–149)
VLDL Cholesterol Cal: 9 mg/dL (ref 5–40)

## 2023-07-27 ENCOUNTER — Other Ambulatory Visit: Payer: Self-pay | Admitting: Family Medicine

## 2023-07-27 ENCOUNTER — Other Ambulatory Visit: Payer: Self-pay

## 2023-07-27 DIAGNOSIS — J302 Other seasonal allergic rhinitis: Secondary | ICD-10-CM

## 2023-07-27 DIAGNOSIS — J4520 Mild intermittent asthma, uncomplicated: Secondary | ICD-10-CM

## 2023-07-27 MED ORDER — FLUTICASONE PROPIONATE 50 MCG/ACT NA SUSP: 2.0000 | Freq: Every day | NASAL | 5 refills | Status: DC

## 2023-07-27 NOTE — Progress Notes (Signed)
Flonase sent

## 2023-07-28 NOTE — Telephone Encounter (Signed)
 Please advise [facility]

## 2023-08-02 ENCOUNTER — Other Ambulatory Visit: Payer: Self-pay | Admitting: Nurse Practitioner

## 2023-08-02 DIAGNOSIS — J4520 Mild intermittent asthma, uncomplicated: Secondary | ICD-10-CM

## 2023-08-02 MED ORDER — FLUTICASONE-SALMETEROL 100-50 MCG/ACT IN AEPB: 1.0000 | INHALATION_SPRAY | Freq: Two times a day (BID) | RESPIRATORY_TRACT | 5 refills | Status: DC

## 2023-08-15 ENCOUNTER — Other Ambulatory Visit: Payer: Self-pay

## 2023-08-15 NOTE — Telephone Encounter (Signed)
 Error

## 2023-08-24 ENCOUNTER — Other Ambulatory Visit: Payer: Self-pay | Admitting: Family Medicine

## 2023-08-24 DIAGNOSIS — J4520 Mild intermittent asthma, uncomplicated: Secondary | ICD-10-CM

## 2023-08-24 DIAGNOSIS — E782 Mixed hyperlipidemia: Secondary | ICD-10-CM

## 2023-10-24 ENCOUNTER — Telehealth: Payer: Self-pay

## 2023-10-24 NOTE — Telephone Encounter (Signed)
 Lvm for pt to advise of awv appt time an date. Also to check if she would like to have it done earlier. KH

## 2023-11-16 DIAGNOSIS — I10 Essential (primary) hypertension: Secondary | ICD-10-CM | POA: Diagnosis not present

## 2023-11-16 DIAGNOSIS — I2510 Atherosclerotic heart disease of native coronary artery without angina pectoris: Secondary | ICD-10-CM | POA: Diagnosis not present

## 2023-11-16 DIAGNOSIS — E782 Mixed hyperlipidemia: Secondary | ICD-10-CM | POA: Diagnosis not present

## 2023-11-16 DIAGNOSIS — I340 Nonrheumatic mitral (valve) insufficiency: Secondary | ICD-10-CM | POA: Diagnosis not present

## 2023-11-20 ENCOUNTER — Other Ambulatory Visit: Payer: Self-pay | Admitting: Nurse Practitioner

## 2023-11-20 ENCOUNTER — Other Ambulatory Visit: Payer: Self-pay | Admitting: Family Medicine

## 2023-11-20 DIAGNOSIS — J4520 Mild intermittent asthma, uncomplicated: Secondary | ICD-10-CM

## 2023-11-20 DIAGNOSIS — E782 Mixed hyperlipidemia: Secondary | ICD-10-CM

## 2023-11-22 ENCOUNTER — Ambulatory Visit: Payer: Self-pay | Admitting: Nurse Practitioner

## 2023-11-22 ENCOUNTER — Encounter: Payer: Self-pay | Admitting: Nurse Practitioner

## 2023-11-22 VITALS — BP 155/61 | HR 80 | Temp 97.1°F | Wt 106.0 lb

## 2023-11-22 DIAGNOSIS — Z13228 Encounter for screening for other metabolic disorders: Secondary | ICD-10-CM

## 2023-11-22 DIAGNOSIS — J302 Other seasonal allergic rhinitis: Secondary | ICD-10-CM

## 2023-11-22 DIAGNOSIS — J4520 Mild intermittent asthma, uncomplicated: Secondary | ICD-10-CM

## 2023-11-22 DIAGNOSIS — Z1329 Encounter for screening for other suspected endocrine disorder: Secondary | ICD-10-CM

## 2023-11-22 DIAGNOSIS — E785 Hyperlipidemia, unspecified: Secondary | ICD-10-CM

## 2023-11-22 DIAGNOSIS — I10 Essential (primary) hypertension: Secondary | ICD-10-CM | POA: Diagnosis not present

## 2023-11-22 DIAGNOSIS — M810 Age-related osteoporosis without current pathological fracture: Secondary | ICD-10-CM | POA: Diagnosis not present

## 2023-11-22 DIAGNOSIS — Z130 Encounter for screening for diseases of the blood and blood-forming organs and certain disorders involving the immune mechanism: Secondary | ICD-10-CM

## 2023-11-22 DIAGNOSIS — Z1321 Encounter for screening for nutritional disorder: Secondary | ICD-10-CM | POA: Diagnosis not present

## 2023-11-22 NOTE — Assessment & Plan Note (Signed)
 Continue.  Zafrilukast 20 mg twice daily, Flonase  nasal spray 2 sprays daily, cetirizine  10 mg daily Avoid allergens

## 2023-11-22 NOTE — Assessment & Plan Note (Addendum)
 Systolic blood pressure remains elevated diastolic blood pressure is well-controlled Continue current medication, encouraged to maintain close follow-up with cardiology DASH diet and commitment to daily physical activity for a minimum of 30 minutes discussed and encouraged, as a part of hypertension management. All     11/22/2023   11:23 AM 11/22/2023   11:06 AM 11/22/2023   10:55 AM 07/25/2023    9:58 AM 07/25/2023    9:47 AM 04/27/2023   10:05 AM 04/27/2023    9:58 AM  BP/Weight  Systolic BP 155 159 154 141 150 152 147  Diastolic BP 61 67 58 53 60 66 64  Wt. (Lbs)   106  106  109  BMI   22.94 kg/m2  22.94 kg/m2  23.59 kg/m2

## 2023-11-22 NOTE — Assessment & Plan Note (Addendum)
 Well-controlled on albuterol  inhaler 2 puffs every 6 hours as needed Zafirlukast  20 mg twice daily,Advair.inhaler 1 puff  twice daily Continue current medications

## 2023-11-22 NOTE — Assessment & Plan Note (Signed)
 On vitamin D3 5000 units daily, calcium  Caltrate 600 mg twice daily Has not had a DEXA scan in years , DEXA scan ordered .

## 2023-11-22 NOTE — Patient Instructions (Signed)
 1. Hyperlipidemia, unspecified hyperlipidemia type (Primary)   2. Primary hypertension  - Basic Metabolic Panel  3. Age related osteoporosis, unspecified pathological fracture presence  - DGBone Density; Please call [phone]   to schedule you r Dexa scan  [facility]. [address] [place], [place] [address]. [place] .     It is important that you exercise regularly at least 30 minutes 5 times a week as tolerated  Think about what you will eat, plan ahead. Choose " clean, green, fresh or frozen" over canned, processed or packaged foods which are more sugary, salty and fatty. 70 to 75% of food eaten should be vegetables and fruit. Three meals at set times with snacks allowed between meals, but they must be fruit or vegetables. Aim to eat over a 12 hour period , example 7 am to 7 pm, and STOP after  your last meal of the day. Drink water,generally about 64 ounces per day, no other drink is as healthy. Fruit juice is best enjoyed in a healthy way, by EATING the fruit.  Thanks for choosing [facility] we consider it a privelige to serve you.

## 2023-11-22 NOTE — Progress Notes (Signed)
 Established Patient Office Visit  Subjective:  Patient ID: Sarah Valenzuela, female    DOB: 1932/05/08  Age: 89 y.o. MRN: [id number]  CC:  Chief Complaint  Patient presents with   Hyperlipidemia    ( Little bit of a shake)   Hypertension    HPI Sarah Valenzuela is a 89 y.o. female  has a past medical history of Asthma, Hypercholesteremia, Hyperlipidemia, Hypertension, Osteoporosis (02/2017), and Situational stress.  She is patient presents for follow-up for her chronic medical conditions  Hypertension.  Currently on triamterene -hydrochlorothiazide 37.5-25 mg(half tablet daily).  She is followed by cardiology.  Stated that her blood pressure was normal at her last visit with cardiology.  Currently denies chest pain, shortness of breath, edema  Taking all home medications daily as orderedm she denies any adverse reactions to current medications     Past Medical History:  Diagnosis Date   Asthma    Hypercholesteremia    Hyperlipidemia    Hypertension    Osteoporosis 02/2017   T score -3.1   Situational stress     History reviewed. No pertinent surgical history.  Family History  Problem Relation Age of Onset   Cancer Brother        Prostate   Breast cancer Sister        UNSURE   Diabetes Sister    Heart disease Sister     Social History   Socioeconomic History   Marital status: Widowed    Spouse name: Not on file   Number of children: 1   Years of education: Not on file   Highest education level: Not on file  Occupational History   Not on file  Tobacco Use   Smoking status: Never   Smokeless tobacco: Never  Vaping Use   Vaping status: Never Used  Substance and Sexual Activity   Alcohol use: No   Drug use: No   Sexual activity: Not Currently    Comment: declined insurance questions  Other Topics Concern   Not on file  Social History Narrative   Lives home alone. Daughter lives [place], has very good neighbors    Social Drivers of Manufacturing engineer Strain: Low Risk  (08/04/2022)   Overall Financial Resource Strain (CARDIA)    Difficulty of Paying Living Expenses: Not hard at all  Food Insecurity: No Food Insecurity (08/04/2022)   Hunger Vital Sign    Worried About Running Out of Food in the Last Year: Never true    Ran Out of Food in the Last Year: Never true  Transportation Needs: No Transportation Needs (08/04/2022)   PRAPARE - Administrator, Civil Service (Medical): No    Lack of Transportation (Non-Medical): No  Physical Activity: Inactive (08/04/2022)   Exercise Vital Sign    Days of Exercise per Week: 0 days    Minutes of Exercise per Session: 0 min  Stress: No Stress Concern Present (08/04/2022)   [employer] of Occupational Health - Occupational Stress Questionnaire    Feeling of Stress : Not at all  Social Connections: Moderately Integrated (08/04/2022)   Social Connection and Isolation Panel [NHANES]    Frequency of Communication with Friends and Family: More than three times a week    Frequency of Social Gatherings with Friends and Family: More than three times a week    Attends Religious Services: More than 4 times per year    Active Member of Clubs or Organizations: Yes    Attends  Club or Organization Meetings: More than 4 times per year    Marital Status: Widowed  Intimate Partner Violence: Not At Risk (08/04/2022)   Humiliation, Afraid, Rape, and Kick questionnaire    Fear of Current or Ex-Partner: No    Emotionally Abused: No    Physically Abused: No    Sexually Abused: No    Outpatient Medications Prior to Visit  Medication Sig Dispense Refill   acetaminophen  (TYLENOL ) 500 MG tablet Take 1 tablet (500 mg total) by mouth every 6 (six) hours as needed for pain. 30 tablet 0   albuterol  (VENTOLIN  HFA) 108 (90 Base) MCG/ACT inhaler INHALE TWO PUFFS INTO THE LUNGS EVERY 6 HOURS FOR WHEEZING OR SHORTNESS OF BREATH 8.5 g 1   aspirin 81 MG chewable tablet Chew 81 mg by mouth daily.      cetirizine  (ZYRTEC ) 10 MG tablet Take 1 tablet (10 mg total) by mouth daily. 90 tablet 1   Cholecalciferol (VITAMIN D3) 125 MCG (5000 UT) CAPS Take 1 capsule by mouth daily.     cycloSPORINE (RESTASIS) 0.05 % ophthalmic emulsion Place 1 drop into both eyes 2 (two) times daily.     feeding supplement, ENSURE COMPLETE, (ENSURE COMPLETE) LIQD Take 237 mLs by mouth 2 (two) times daily between meals. Two bottles daily 62130 mL 12   fluticasone  (FLONASE ) 50 MCG/ACT nasal spray Place 2 sprays into both nostrils daily. 16 g 5   fluticasone -salmeterol (ADVAIR) 100-50 MCG/ACT AEPB Inhale 1 puff into the lungs 2 (two) times daily. 60 each 5   Multiple Vitamins-Minerals (AIRBORNE PO) Take by mouth.     rosuvastatin  (CRESTOR ) 5 MG tablet TAKE ONE TABLET BY MOUTH DAILY 90 tablet 0   SENNOSIDES PO Take 8.5 mg by mouth as needed (Swiss Kriss OTC Laxative).     triamterene -hydrochlorothiazide (MAXZIDE-25) 37.5-25 MG tablet Take 0.5 tablets by mouth daily. 90 tablet 1   zafirlukast  (ACCOLATE ) 20 MG tablet TAKE 1 TABLET BY MOUTH TWICE DAILY. 180 tablet 0   calcium  citrate (CALCITRATE - DOSED IN MG ELEMENTAL CALCIUM ) 950 MG tablet Take 1 tablet by mouth 2 (two) times daily.     fluticasone  (FLONASE ) 50 MCG/ACT nasal spray Place 2 sprays into both nostrils daily. 16 g 5   Calcium  Carbonate (CALTRATE 600 PO) Take 600 mg by mouth 2 (two) times daily.     clobetasol  ointment (TEMOVATE ) 0.05 % APPLY TWICE A WEEK, LONG TERM. (Patient not taking: Reported on 11/22/2023) 30 g 3   No facility-administered medications prior to visit.    Allergies  Allergen Reactions   Fish Allergy    Penicillins Other (See Comments)    Breaks out. Longtime allergy   Onion     ROS Review of Systems  Constitutional:  Negative for appetite change, chills, fatigue and fever.  HENT:  Negative for congestion, postnasal drip, rhinorrhea and sneezing.   Respiratory:  Negative for cough, shortness of breath and wheezing.   Cardiovascular:   Negative for chest pain, palpitations and leg swelling.  Gastrointestinal:  Negative for abdominal pain, constipation, nausea and vomiting.  Genitourinary:  Negative for difficulty urinating, dysuria, flank pain and frequency.  Musculoskeletal:  Negative for arthralgias, back pain, joint swelling and myalgias.  Skin:  Negative for color change, pallor, rash and wound.  Neurological:  Negative for dizziness, facial asymmetry, weakness, numbness and headaches.  Psychiatric/Behavioral:  Negative for behavioral problems, confusion, self-injury and suicidal ideas.       Objective:     Physical Exam Vitals and nursing  note reviewed.  Constitutional:      General: She is not in acute distress.    Appearance: Normal appearance. She is not ill-appearing, toxic-appearing or diaphoretic.  HENT:     Mouth/Throat:     Pharynx: No posterior oropharyngeal erythema.  Eyes:     General: No scleral icterus.       Right eye: No discharge.        Left eye: No discharge.     Extraocular Movements: Extraocular movements intact.     Conjunctiva/sclera: Conjunctivae normal.  Cardiovascular:     Rate and Rhythm: Normal rate and regular rhythm.     Pulses: Normal pulses.     Heart sounds: Murmur heard.     No friction rub. No gallop.  Pulmonary:     Effort: Pulmonary effort is normal. No respiratory distress.     Breath sounds: Normal breath sounds. No stridor. No wheezing, rhonchi or rales.  Chest:     Chest wall: No tenderness.  Abdominal:     General: There is no distension.     Palpations: Abdomen is soft.     Tenderness: There is no abdominal tenderness. There is no right CVA tenderness, left CVA tenderness or guarding.  Musculoskeletal:        General: No swelling, tenderness, deformity or signs of injury.     Right lower leg: No edema.     Left lower leg: No edema.  Skin:    General: Skin is warm and dry.     Capillary Refill: Capillary refill takes less than 2 seconds.     Coloration:  Skin is not jaundiced or pale.     Findings: No bruising, erythema or lesion.  Neurological:     Mental Status: She is alert and oriented to person, place, and time.     Motor: No weakness.     Gait: Gait normal.  Psychiatric:        Mood and Affect: Mood normal.        Behavior: Behavior normal.        Thought Content: Thought content normal.        Judgment: Judgment normal.     BP (!) 155/61   Pulse 80   Temp (!) 97.1 F (36.2 C)   Wt 106 lb (48.1 kg)   SpO2 100%   BMI 22.94 kg/m  Wt Readings from Last 3 Encounters:  11/22/23 106 lb (48.1 kg)  07/25/23 106 lb (48.1 kg)  04/27/23 109 lb (49.4 kg)    Lab Results  Component Value Date   TSH 1.75 10/17/2012   Lab Results  Component Value Date   WBC 7.0 02/07/2019   HGB 12.1 02/07/2019   HCT 35.8 06/22/2021   MCV 94 02/07/2019   PLT 218 02/07/2019   Lab Results  Component Value Date   NA 135 07/25/2023   K 3.8 07/25/2023   CO2 23 07/25/2023   GLUCOSE 93 07/25/2023   BUN 13 07/25/2023   CREATININE 0.62 07/25/2023   BILITOT 0.4 07/25/2023   ALKPHOS 57 07/25/2023   AST 28 07/25/2023   ALT 16 07/25/2023   PROT 7.0 07/25/2023   ALBUMIN 4.4 07/25/2023   CALCIUM  9.3 07/25/2023   EGFR 85 07/25/2023   Lab Results  Component Value Date   CHOL 158 07/25/2023   Lab Results  Component Value Date   HDL 94 07/25/2023   Lab Results  Component Value Date   LDLCALC 55 07/25/2023   Lab Results  Component  Value Date   TRIG 39 07/25/2023   Lab Results  Component Value Date   CHOLHDL 1.7 07/25/2023   Lab Results  Component Value Date   HGBA1C 5.1 04/22/2016      Assessment & Plan:   Problem List Items Addressed This Visit       Cardiovascular and Mediastinum   HTN (hypertension)   Systolic blood pressure remains elevated diastolic blood pressure is well-controlled Continue current medication, encouraged to maintain close follow-up with cardiology DASH diet and commitment to daily physical activity  for a minimum of 30 minutes discussed and encouraged, as a part of hypertension management. All     11/22/2023   11:23 AM 11/22/2023   11:06 AM 11/22/2023   10:55 AM 07/25/2023    9:58 AM 07/25/2023    9:47 AM 04/27/2023   10:05 AM 04/27/2023    9:58 AM  BP/Weight  Systolic BP 155 159 154 141 150 152 147  Diastolic BP 61 67 58 53 60 66 64  Wt. (Lbs)   106  106  109  BMI   22.94 kg/m2  22.94 kg/m2  23.59 kg/m2           Relevant Orders   Basic Metabolic Panel     Respiratory   Asthma   Well-controlled on albuterol  inhaler 2 puffs every 6 hours as needed Zafirlukast  20 mg twice daily,Advair.inhaler 1 puff  twice daily Continue current medications          Musculoskeletal and Integument   Age related osteoporosis   On vitamin D3 5000 units daily, calcium  Caltrate 600 mg twice daily Has not had a DEXA scan in years , DEXA scan ordered .      Relevant Medications   Calcium  Carbonate (CALTRATE 600 PO)   Other Relevant Orders   DG Bone Density     Other   Hyperlipidemia - Primary (Chronic)   Lab Results  Component Value Date   CHOL 158 07/25/2023   HDL 94 07/25/2023   LDLCALC 55 07/25/2023   TRIG 39 07/25/2023   CHOLHDL 1.7 07/25/2023  Well-controlled on Crestor  5 mg daily Continue current medication       Seasonal allergies   Continue.  Zafrilukast 20 mg twice daily, Flonase  nasal spray 2 sprays daily, cetirizine  10 mg daily Avoid allergens       Other Visit Diagnoses       Screening for endocrine, nutritional, metabolic and immunity disorder       Relevant Orders   CBC       No orders of the defined types were placed in this encounter.   Follow-up: Return in about 4 months (around 03/24/2024) for HTN.    Ozzy Bohlken R Rennee Coyne, FNP

## 2023-11-22 NOTE — Assessment & Plan Note (Addendum)
 Lab Results  Component Value Date   CHOL 158 07/25/2023   HDL 94 07/25/2023   LDLCALC 55 07/25/2023   TRIG 39 07/25/2023   CHOLHDL 1.7 07/25/2023  Well-controlled on Crestor  5 mg daily Continue current medication

## 2023-11-23 LAB — BASIC METABOLIC PANEL WITH GFR
BUN/Creatinine Ratio: 16 (ref 12–28)
BUN: 10 mg/dL (ref 8–27)
CO2: 21 mmol/L (ref 20–29)
Calcium: 9.7 mg/dL (ref 8.7–10.3)
Chloride: 93 mmol/L — ABNORMAL LOW (ref 96–106)
Creatinine, Ser: 0.64 mg/dL (ref 0.57–1.00)
Glucose: 90 mg/dL (ref 70–99)
Potassium: 4 mmol/L (ref 3.5–5.2)
Sodium: 131 mmol/L — ABNORMAL LOW (ref 134–144)
eGFR: 84 mL/min/{1.73_m2} (ref >59)

## 2023-11-23 LAB — CBC
Hematocrit: 39.9 % (ref 34.0–46.6)
Hemoglobin: 12.6 g/dL (ref 11.1–15.9)
MCH: 30.7 pg (ref 26.6–33.0)
MCHC: 31.6 g/dL (ref 31.5–35.7)
MCV: 97 fL (ref 79–97)
Platelets: 218 10*3/uL (ref 150–450)
RBC: 4.1 x10E6/uL (ref 3.77–5.28)
RDW: 12 % (ref 11.7–15.4)
WBC: 8.1 10*3/uL (ref 3.4–10.8)

## 2023-11-24 ENCOUNTER — Ambulatory Visit: Payer: Self-pay | Admitting: Nurse Practitioner

## 2023-12-04 ENCOUNTER — Ambulatory Visit (INDEPENDENT_AMBULATORY_CARE_PROVIDER_SITE_OTHER): Payer: Self-pay

## 2023-12-04 VITALS — Ht <= 58 in | Wt 106.0 lb

## 2023-12-04 DIAGNOSIS — Z0000 Encounter for general adult medical examination without abnormal findings: Secondary | ICD-10-CM | POA: Diagnosis not present

## 2023-12-04 NOTE — Progress Notes (Signed)
 Subjective:   Sarah Valenzuela is a 89 y.o. female who presents for Medicare Annual (Subsequent) preventive examination.  Visit Complete: Virtual I connected with  Sarah Valenzuela on 12/04/23 by a audio enabled telemedicine application and verified that I am speaking with the correct person using two identifiers.  Patient Location: Home  Provider Location: Office/Clinic  I discussed the limitations of evaluation and management by telemedicine. The patient expressed understanding and agreed to proceed.  Vital Signs: Because this visit was a virtual/telehealth visit, some criteria may be missing or patient reported. Any vitals not documented were not able to be obtained and vitals that have been documented are patient reported.  Patient Medicare AWV questionnaire was completed by the patient on 12/04/2023; I have confirmed that all information answered by patient is correct and no changes since this date.        Objective:     There were no vitals filed for this visit. There is no height or weight on file to calculate BMI.     08/04/2022    4:16 PM 01/20/2017   10:22 AM 10/21/2016   10:31 AM 04/22/2016   10:04 AM 10/22/2015   10:16 AM 07/24/2015   10:09 AM 05/04/2015    1:26 PM  Advanced Directives  Does Patient Have a Medical Advance Directive? Yes No No  No No No  Type of Estate agent of [place];Living will        Copy of Healthcare Power of Attorney in Chart? No - copy requested        Would patient like information on creating a medical advance directive?    Yes - Educational materials given No - patient declined information No - patient declined information No - patient declined information    Current Medications (verified) Outpatient Encounter Medications as of 12/04/2023  Medication Sig   acetaminophen  (TYLENOL ) 500 MG tablet Take 1 tablet (500 mg total) by mouth every 6 (six) hours as needed for pain.   albuterol  (VENTOLIN  HFA) 108 (90  Base) MCG/ACT inhaler INHALE TWO PUFFS INTO THE LUNGS EVERY 6 HOURS FOR WHEEZING OR SHORTNESS OF BREATH   aspirin 81 MG chewable tablet Chew 81 mg by mouth daily.   Calcium  Carbonate (CALTRATE 600 PO) Take 600 mg by mouth 2 (two) times daily.   cetirizine  (ZYRTEC ) 10 MG tablet Take 1 tablet (10 mg total) by mouth daily.   Cholecalciferol (VITAMIN D3) 125 MCG (5000 UT) CAPS Take 1 capsule by mouth daily.   clobetasol  ointment (TEMOVATE ) 0.05 % APPLY TWICE A WEEK, LONG TERM. (Patient not taking: Reported on 11/22/2023)   cycloSPORINE (RESTASIS) 0.05 % ophthalmic emulsion Place 1 drop into both eyes 2 (two) times daily.   feeding supplement, ENSURE COMPLETE, (ENSURE COMPLETE) LIQD Take 237 mLs by mouth 2 (two) times daily between meals. Two bottles daily   fluticasone  (FLONASE ) 50 MCG/ACT nasal spray Place 2 sprays into both nostrils daily.   fluticasone -salmeterol (ADVAIR) 100-50 MCG/ACT AEPB Inhale 1 puff into the lungs 2 (two) times daily.   Multiple Vitamins-Minerals (AIRBORNE PO) Take by mouth.   rosuvastatin  (CRESTOR ) 5 MG tablet TAKE ONE TABLET BY MOUTH DAILY   SENNOSIDES PO Take 8.5 mg by mouth as needed (Swiss Kriss OTC Laxative).   triamterene -hydrochlorothiazide (MAXZIDE-25) 37.5-25 MG tablet Take 0.5 tablets by mouth daily.   zafirlukast  (ACCOLATE ) 20 MG tablet TAKE 1 TABLET BY MOUTH TWICE DAILY.   No facility-administered encounter medications on file as of 12/04/2023.    Allergies (  verified) Fish allergy, Penicillins, and Onion   History: Past Medical History:  Diagnosis Date   Asthma    Hypercholesteremia    Hyperlipidemia    Hypertension    Osteoporosis 02/2017   T score -3.1   Situational stress    No past surgical history on file. Family History  Problem Relation Age of Onset   Cancer Brother        Prostate   Breast cancer Sister        UNSURE   Diabetes Sister    Heart disease Sister    Social History   Socioeconomic History   Marital status: Widowed     Spouse name: Not on file   Number of children: 1   Years of education: Not on file   Highest education level: Not on file  Occupational History   Not on file  Tobacco Use   Smoking status: Never   Smokeless tobacco: Never  Vaping Use   Vaping status: Never Used  Substance and Sexual Activity   Alcohol use: No   Drug use: No   Sexual activity: Not Currently    Comment: declined insurance questions  Other Topics Concern   Not on file  Social History Narrative   Lives home alone. Daughter lives [place], has very good neighbors    Social Drivers of Corporate investment banker Strain: Low Risk  (08/04/2022)   Overall Financial Resource Strain (CARDIA)    Difficulty of Paying Living Expenses: Not hard at all  Food Insecurity: No Food Insecurity (08/04/2022)   Hunger Vital Sign    Worried About Running Out of Food in the Last Year: Never true    Ran Out of Food in the Last Year: Never true  Transportation Needs: No Transportation Needs (08/04/2022)   PRAPARE - Administrator, Civil Service (Medical): No    Lack of Transportation (Non-Medical): No  Physical Activity: Inactive (08/04/2022)   Exercise Vital Sign    Days of Exercise per Week: 0 days    Minutes of Exercise per Session: 0 min  Stress: No Stress Concern Present (08/04/2022)   [employer] of Occupational Health - Occupational Stress Questionnaire    Feeling of Stress : Not at all  Social Connections: Moderately Integrated (08/04/2022)   Social Connection and Isolation Panel [NHANES]    Frequency of Communication with Friends and Family: More than three times a week    Frequency of Social Gatherings with Friends and Family: More than three times a week    Attends Religious Services: More than 4 times per year    Active Member of [employer] or Organizations: Yes    Attends Banker Meetings: More than 4 times per year    Marital Status: Widowed    Tobacco Counseling Counseling given: Not  Answered   Clinical Intake:                        Activities of Daily Living     No data to display          Patient Care Team: Paseda, Folashade R, FNP as PCP - General (Nurse Practitioner)  Indicate any recent Medical Services you may have received from other than [facility] providers in the past year (date may be approximate).     Assessment:    This is a routine wellness examination for [place].  Hearing/Vision screen No results found.   Goals Addressed   None  Depression Screen    11/22/2023   10:58 AM 07/25/2023    9:52 AM 01/17/2023    9:45 AM 10/18/2022    9:52 AM 08/04/2022    4:14 PM 07/12/2022    9:33 AM 01/04/2022   10:31 AM  PHQ 2/9 Scores  PHQ - 2 Score 1 1 0 1 0 0 0  PHQ- 9 Score       0    Fall Risk    11/22/2023   10:58 AM 07/25/2023    9:51 AM 01/17/2023    9:46 AM 10/18/2022    9:51 AM 08/04/2022    4:15 PM  Fall Risk   Falls in the past year? 0 0 0 0 0  Number falls in past yr: 0 0 0 0 0  Injury with Fall? 0 0 0 0 0  Risk for fall due to : No Fall Risks No Fall Risks No Fall Risks No Fall Risks No Fall Risks  Follow up Falls evaluation completed Falls evaluation completed Falls evaluation completed Falls evaluation completed Falls prevention discussed    MEDICARE RISK AT HOME:    TIMED UP AND GO:  Was the test performed?  No    Cognitive Function:    05/11/2018   11:08 AM  MMSE - Mini Mental State Exam  Orientation to time 5  Orientation to Place 5  Registration 3  Attention/ Calculation 5  Recall 3  Language- name 2 objects 2  Language- repeat 1  Language- follow 3 step command 3  Language- read & follow direction 1  Write a sentence 1  Copy design 1  Total score 30        08/04/2022    4:16 PM  6CIT Screen  What Year? 0 points  What month? 0 points  What time? 0 points  Count back from 20 0 points  Months in reverse 0 points  Repeat phrase 0 points  Total Score 0 points    Immunizations Immunization  History  Administered Date(s) Administered   Fluad Quad(high Dose 65+) 04/12/2022   Influenza Whole 04/17/2012   Influenza, High Dose Seasonal PF 03/01/2023   Influenza,inj,Quad PF,6+ Mos 04/01/2013, 03/24/2014, 04/22/2016, 04/02/2018, 03/05/2019, 03/23/2021   Influenza-Unspecified 04/19/2015, 04/07/2017, 03/01/2023   Moderna Covid-19 Fall Seasonal Vaccine 31yrs & older 04/18/2022, 03/01/2023   PFIZER(Purple Top)SARS-COV-2 Vaccination 07/12/2019, 07/31/2019, 03/27/2020, 03/11/2021   Pfizer Covid-19 Vaccine Bivalent Booster 7yrs & up 03/11/2021   Pneumococcal Conjugate-13 08/20/2014   Pneumococcal Polysaccharide-23 09/28/2021   Rsv, Bivalent, Protein Subunit Rsvpref,pf Pattricia Bores) 05/16/2022   Tdap 08/21/2013   Zoster Recombinant(Shingrix) 05/02/2022, 11/14/2022    TDAP status: Due, Education has been provided regarding the importance of this vaccine. Advised may receive this vaccine at local pharmacy or Health Dept. Aware to provide a copy of the vaccination record if obtained from local pharmacy or Health Dept. Verbalized acceptance and understanding.  Flu Vaccine status: Up to date  Pneumococcal vaccine status: Up to date  Covid-19 vaccine status:  Education has been provided regarding the importance of this vaccine. Advised may receive this vaccine at local pharmacy or Health Dept.or vaccine clinic. Aware to provide a copy of the vaccination record if obtained from local pharmacy or Health Dept. Verbalized acceptance and understanding.  Qualifies for Shingles Vaccine? Yes   Zostavax completed Yes   Shingrix Completed?: Yes  Screening Tests Health Maintenance  Topic Date Due   Medicare Annual Wellness (AWV)  08/05/2023   DTaP/Tdap/Td (2 - Td or Tdap) 08/22/2023  COVID-19 Vaccine (7 - Pfizer risk 2024-25 season) 08/29/2023   INFLUENZA VACCINE  01/26/2024   Pneumonia Vaccine 66+ Years old  Completed   DEXA SCAN  Completed   Zoster Vaccines- Shingrix  Completed   HPV VACCINES   Aged Out   Meningococcal B Vaccine  Aged Out    Health Maintenance  Health Maintenance Due  Topic Date Due   Medicare Annual Wellness (AWV)  08/05/2023   DTaP/Tdap/Td (2 - Td or Tdap) 08/22/2023   COVID-19 Vaccine (7 - Pfizer risk 2024-25 season) 08/29/2023    Colorectal cancer screening: No longer required.   Mammogram status: No longer required due to age.  Bone Density status: Completed 03/06/2017. Results reflect: Bone density results: OSTEOPOROSIS. Repeat every 2 years.  Lung Cancer Screening: (Low Dose CT Chest recommended if Age 29-80 years, 20 pack-year currently smoking OR have quit w/in 15years.) does not qualify.   Lung Cancer Screening Referral: NA  Additional Screening:   Vision Screening: Recommended annual ophthalmology exams for early detection of glaucoma and other disorders of the eye. Is the patient up to date with their annual eye exam?  Yes  Who is the provider or what is the name of the office in which the patient attends annual eye exams? Dr. Lasandra Points If pt is not established with a provider, would they like to be referred to a provider to establish care? No .   Dental Screening: Recommended annual dental exams for proper oral hygiene   Community Resource Referral / Chronic Care Management: CRR required this visit?  No   CCM required this visit?  No     Plan:     I have personally reviewed and noted the following in the patient's chart:   Medical and social history Use of alcohol, tobacco or illicit drugs  Current medications and supplements including opioid prescriptions. Patient is not currently taking opioid prescriptions. Functional ability and status Nutritional status Physical activity Advanced directives List of other physicians Hospitalizations, surgeries, and ER visits in previous 12 months Vitals Screenings to include cognitive, depression, and falls Referrals and appointments  In addition, I have reviewed and discussed with  patient certain preventive protocols, quality metrics, and best practice recommendations. A written personalized care plan for preventive services as well as general preventive health recommendations were provided to patient.     Ayden Hardwick, RMA   12/04/2023   After Visit Summary: (MyChart) Due to this being a telephonic visit, the after visit summary with patients personalized plan was offered to patient via MyChart   Nurse Notes: Thank you for your time today.

## 2024-01-16 ENCOUNTER — Other Ambulatory Visit: Payer: Self-pay | Admitting: Family Medicine

## 2024-01-16 DIAGNOSIS — I10 Essential (primary) hypertension: Secondary | ICD-10-CM

## 2024-02-22 ENCOUNTER — Other Ambulatory Visit: Payer: Self-pay | Admitting: Nurse Practitioner

## 2024-02-22 DIAGNOSIS — J4520 Mild intermittent asthma, uncomplicated: Secondary | ICD-10-CM

## 2024-02-22 DIAGNOSIS — J302 Other seasonal allergic rhinitis: Secondary | ICD-10-CM

## 2024-02-22 DIAGNOSIS — E782 Mixed hyperlipidemia: Secondary | ICD-10-CM

## 2024-02-23 DIAGNOSIS — H04123 Dry eye syndrome of bilateral lacrimal glands: Secondary | ICD-10-CM | POA: Diagnosis not present

## 2024-02-23 DIAGNOSIS — H40023 Open angle with borderline findings, high risk, bilateral: Secondary | ICD-10-CM | POA: Diagnosis not present

## 2024-02-23 DIAGNOSIS — H524 Presbyopia: Secondary | ICD-10-CM | POA: Diagnosis not present

## 2024-02-23 DIAGNOSIS — H35363 Drusen (degenerative) of macula, bilateral: Secondary | ICD-10-CM | POA: Diagnosis not present

## 2024-03-22 ENCOUNTER — Other Ambulatory Visit: Payer: Self-pay | Admitting: Nurse Practitioner

## 2024-03-22 DIAGNOSIS — J302 Other seasonal allergic rhinitis: Secondary | ICD-10-CM

## 2024-03-25 ENCOUNTER — Encounter: Payer: Self-pay | Admitting: Nurse Practitioner

## 2024-03-25 ENCOUNTER — Ambulatory Visit: Payer: Self-pay | Admitting: Nurse Practitioner

## 2024-03-25 VITALS — BP 147/67 | HR 77 | Wt 107.0 lb

## 2024-03-25 DIAGNOSIS — I10 Essential (primary) hypertension: Secondary | ICD-10-CM | POA: Diagnosis not present

## 2024-03-25 DIAGNOSIS — E785 Hyperlipidemia, unspecified: Secondary | ICD-10-CM | POA: Diagnosis not present

## 2024-03-25 DIAGNOSIS — J4520 Mild intermittent asthma, uncomplicated: Secondary | ICD-10-CM

## 2024-03-25 DIAGNOSIS — L299 Pruritus, unspecified: Secondary | ICD-10-CM | POA: Insufficient documentation

## 2024-03-25 DIAGNOSIS — M810 Age-related osteoporosis without current pathological fracture: Secondary | ICD-10-CM | POA: Diagnosis not present

## 2024-03-25 DIAGNOSIS — R2689 Other abnormalities of gait and mobility: Secondary | ICD-10-CM | POA: Insufficient documentation

## 2024-03-25 MED ORDER — COVID-19 MRNA VAC-TRIS(PFIZER) 30 MCG/0.3ML IM SUSY: 0.3000 mL | PREFILLED_SYRINGE | Freq: Once | INTRAMUSCULAR | 0 refills | Status: DC

## 2024-03-25 NOTE — Assessment & Plan Note (Signed)
 BP Readings from Last 3 Encounters:  03/25/24 (!) 147/67  11/22/23 (!) 155/61  07/25/23 (!) 141/53   Hypertension Blood pressure management discussed. Systolic slightly elevated, diastolic normal.  Has upcoming appointment with cardiology - Continue current antihypertensive regimen Maxide 25(0.5 tablets daily) - advised a heart-healthy, low-salt, low-fat diet. - Engage in regular exercise as tolerated.

## 2024-03-25 NOTE — Assessment & Plan Note (Signed)
  Asthma management discussed. - Continue fluticasone -salmeterol (Advair) 100-50 MCG/ACT inhalation twice daily. - Continue albuterol  (Ventolin  HFA) 108 (90 Base) MCG/ACT inhalation every 6 hours as needed.zafirlukast  20mg  twice daily

## 2024-03-25 NOTE — Assessment & Plan Note (Signed)
  Intermittent itching reported. Hydrocortisone cream provides relief. Dry skin may contribute. - Continue using hydrocortisone cream as needed. - Use good moisturizing cream to prevent dry skin.

## 2024-03-25 NOTE — Assessment & Plan Note (Addendum)
 Continue calcium  carbonate 600 mg twice daily, vitamin D  5000 units daily - Arrange bone density test at new location, possibly on [address]. Will add on a vitamin D  lab

## 2024-03-25 NOTE — Assessment & Plan Note (Signed)
 Lab Results  Component Value Date   CHOL 158 07/25/2023   HDL 94 07/25/2023   LDLCALC 55 07/25/2023   TRIG 39 07/25/2023   CHOLHDL 1.7 07/25/2023    Cholesterol management discussed. - Continue rosuvastatin  (Crestor ) 5 mg oral daily.

## 2024-03-25 NOTE — Assessment & Plan Note (Signed)
 Balance issues reported. No current use of cane or walker. Exercises discussed. - Encourage balance exercises. - Ensure home safety to prevent falls. - Consider cane  if balance issues worsen. Will order a shower chair

## 2024-03-25 NOTE — Patient Instructions (Signed)

## 2024-03-25 NOTE — Progress Notes (Signed)
 Established Patient Office Visit  Subjective:  Patient ID: Sarah Valenzuela, female    DOB: 11-18-1931  Age: 89 y.o. MRN: [id number]  CC:  Chief Complaint  Patient presents with   Hypertension    HPI  Discussed the use of AI scribe software for clinical note transcription with the patient, who gave verbal consent to proceed.  History of Present Illness Sarah Valenzuela is a 89 year old female  has a past medical history of Asthma, Hypercholesteremia, Hyperlipidemia, Hypertension, Osteoporosis (02/2017), and Situational stress. who presents for a regular follow-up for her chronic medical conditions  She experiences a stinging and burning sensation in her legs, which she manages with cortisone cream. Occasional itching is also relieved by the cream. No chest pain, shortness of breath, abdominal pain, nausea, or vomiting.  She notes some balance issues, feeling like she is not walking straight at times, but continues to walk regularly. She does not use a cane and  does not feel it is necessary all the time.   She stays active by attending church and exercise classes at the [facility] twice a week. She enjoys cherry yogurt and drinks cranberry and orange juice. She reports that she does not think she has diabetes.    Assessment & Plan     Past Medical History:  Diagnosis Date   Asthma    Hypercholesteremia    Hyperlipidemia    Hypertension    Osteoporosis 02/2017   T score -3.1   Situational stress     History reviewed. No pertinent surgical history.  Family History  Problem Relation Age of Onset   Cancer Brother        Prostate   Breast cancer Sister        UNSURE   Diabetes Sister    Heart disease Sister     Social History   Socioeconomic History   Marital status: Widowed    Spouse name: Not on file   Number of children: 1   Years of education: Not on file   Highest education level: Not on file  Occupational History   Not on file  Tobacco Use    Smoking status: Never   Smokeless tobacco: Never  Vaping Use   Vaping status: Never Used  Substance and Sexual Activity   Alcohol use: No   Drug use: No   Sexual activity: Not Currently    Comment: declined insurance questions  Other Topics Concern   Not on file  Social History Narrative   Lives home alone. Daughter lives [place], has very good neighbors    Social Drivers of Corporate investment banker Strain: Low Risk  (08/04/2022)   Overall Financial Resource Strain (CARDIA)    Difficulty of Paying Living Expenses: Not hard at all  Food Insecurity: No Food Insecurity (12/04/2023)   Hunger Vital Sign    Worried About Running Out of Food in the Last Year: Never true    Ran Out of Food in the Last Year: Never true  Transportation Needs: No Transportation Needs (12/04/2023)   PRAPARE - Administrator, Civil Service (Medical): No    Lack of Transportation (Non-Medical): No  Physical Activity: Inactive (08/04/2022)   Exercise Vital Sign    Days of Exercise per Week: 0 days    Minutes of Exercise per Session: 0 min  Stress: No Stress Concern Present (08/04/2022)   [employer] of Occupational Health - Occupational Stress Questionnaire    Feeling of Stress : Not  at all  Social Connections: Moderately Integrated (08/04/2022)   Social Connection and Isolation Panel    Frequency of Communication with Friends and Family: More than three times a week    Frequency of Social Gatherings with Friends and Family: More than three times a week    Attends Religious Services: More than 4 times per year    Active Member of [employer] or Organizations: Yes    Attends Banker Meetings: More than 4 times per year    Marital Status: Widowed  Intimate Partner Violence: Not At Risk (08/04/2022)   Humiliation, Afraid, Rape, and Kick questionnaire    Fear of Current or Ex-Partner: No    Emotionally Abused: No    Physically Abused: No    Sexually Abused: No    Outpatient Medications  Prior to Visit  Medication Sig Dispense Refill   acetaminophen  (TYLENOL ) 500 MG tablet Take 1 tablet (500 mg total) by mouth every 6 (six) hours as needed for pain. 30 tablet 0   albuterol  (VENTOLIN  HFA) 108 (90 Base) MCG/ACT inhaler INHALE TWO PUFFS INTO THE LUNGS EVERY 6 HOURS FOR WHEEZING OR SHORTNESS OF BREATH 8.5 g 1   aspirin 81 MG chewable tablet Chew 81 mg by mouth daily.     Calcium  Carbonate (CALTRATE 600 PO) Take 600 mg by mouth 2 (two) times daily.     cetirizine  (ZYRTEC ) 10 MG tablet Take 1 tablet (10 mg total) by mouth daily. 90 tablet 1   Cholecalciferol (VITAMIN D3) 125 MCG (5000 UT) CAPS Take 1 capsule by mouth daily.     cycloSPORINE (RESTASIS) 0.05 % ophthalmic emulsion Place 1 drop into both eyes 2 (two) times daily.     feeding supplement, ENSURE COMPLETE, (ENSURE COMPLETE) LIQD Take 237 mLs by mouth 2 (two) times daily between meals. Two bottles daily 13272 mL 12   fluticasone  (FLONASE ) 50 MCG/ACT nasal spray INSTILL TWO SPRAYS INTO EACH NOSTRIL DAILY 16 g 0   fluticasone -salmeterol (ADVAIR) 100-50 MCG/ACT AEPB Inhale 1 puff into the lungs 2 (two) times daily. 60 each 0   Multiple Vitamins-Minerals (AIRBORNE PO) Take by mouth.     rosuvastatin  (CRESTOR ) 5 MG tablet TAKE ONE TABLET BY MOUTH DAILY 90 tablet 0   SENNOSIDES PO Take 8.5 mg by mouth as needed (Swiss Kriss OTC Laxative).     triamterene -hydrochlorothiazide (MAXZIDE-25) 37.5-25 MG tablet Take 1/2 tablet by mouth daily. 90 tablet 1   zafirlukast  (ACCOLATE ) 20 MG tablet TAKE 1 TABLET BY MOUTH TWICE DAILY. 180 tablet 0   clobetasol  ointment (TEMOVATE ) 0.05 % APPLY TWICE A WEEK, LONG TERM. (Patient not taking: Reported on 03/25/2024) 30 g 3   No facility-administered medications prior to visit.    Allergies  Allergen Reactions   Fish Allergy    Penicillins Other (See Comments)    Breaks out. Longtime allergy   Onion     ROS Review of Systems  Constitutional:  Negative for appetite change, chills, fatigue and  fever.  HENT:  Negative for congestion, postnasal drip, rhinorrhea and sneezing.   Respiratory:  Negative for cough, shortness of breath and wheezing.   Cardiovascular:  Negative for chest pain, palpitations and leg swelling.  Gastrointestinal:  Negative for abdominal pain, constipation, nausea and vomiting.  Genitourinary:  Negative for difficulty urinating, dysuria, flank pain and frequency.  Musculoskeletal:  Negative for arthralgias, back pain, joint swelling and myalgias.  Skin:  Negative for color change, pallor, rash and wound.  Neurological:  Negative for dizziness, facial asymmetry, weakness,  numbness and headaches.  Psychiatric/Behavioral:  Negative for behavioral problems, confusion, self-injury and suicidal ideas.       Objective:    Physical Exam Vitals and nursing note reviewed.  Constitutional:      General: She is not in acute distress.    Appearance: Normal appearance. She is not ill-appearing, toxic-appearing or diaphoretic.  Eyes:     General: No scleral icterus.       Right eye: No discharge.        Left eye: No discharge.     Extraocular Movements: Extraocular movements intact.     Conjunctiva/sclera: Conjunctivae normal.  Cardiovascular:     Rate and Rhythm: Normal rate and regular rhythm.     Pulses: Normal pulses.     Heart sounds: Normal heart sounds. No murmur heard.    No friction rub. No gallop.  Pulmonary:     Effort: Pulmonary effort is normal. No respiratory distress.     Breath sounds: Normal breath sounds. No stridor. No wheezing, rhonchi or rales.  Chest:     Chest wall: No tenderness.  Abdominal:     General: There is no distension.     Palpations: Abdomen is soft.     Tenderness: There is no abdominal tenderness. There is no right CVA tenderness, left CVA tenderness or guarding.  Musculoskeletal:        General: No swelling, tenderness, deformity or signs of injury.     Right lower leg: No edema.     Left lower leg: No edema.  Skin:     General: Skin is warm and dry.     Capillary Refill: Capillary refill takes less than 2 seconds.     Coloration: Skin is not jaundiced or pale.     Findings: No bruising, erythema or lesion.  Neurological:     Mental Status: She is alert and oriented to person, place, and time.     Motor: No weakness.     Gait: Gait normal.  Psychiatric:        Mood and Affect: Mood normal.        Behavior: Behavior normal.        Thought Content: Thought content normal.        Judgment: Judgment normal.     BP (!) 147/67   Pulse 77   Wt 107 lb (48.5 kg)   SpO2 100%   BMI 22.36 kg/m  Wt Readings from Last 3 Encounters:  03/25/24 107 lb (48.5 kg)  12/04/23 106 lb (48.1 kg)  11/22/23 106 lb (48.1 kg)    Lab Results  Component Value Date   TSH 1.75 10/17/2012   Lab Results  Component Value Date   WBC 8.1 11/22/2023   HGB 12.6 11/22/2023   HCT 39.9 11/22/2023   MCV 97 11/22/2023   PLT 218 11/22/2023   Lab Results  Component Value Date   NA 131 (L) 11/22/2023   K 4.0 11/22/2023   CO2 21 11/22/2023   GLUCOSE 90 11/22/2023   BUN 10 11/22/2023   CREATININE 0.64 11/22/2023   BILITOT 0.4 07/25/2023   ALKPHOS 57 07/25/2023   AST 28 07/25/2023   ALT 16 07/25/2023   PROT 7.0 07/25/2023   ALBUMIN 4.4 07/25/2023   CALCIUM  9.7 11/22/2023   EGFR 84 11/22/2023   Lab Results  Component Value Date   CHOL 158 07/25/2023   Lab Results  Component Value Date   HDL 94 07/25/2023   Lab Results  Component Value Date  LDLCALC 55 07/25/2023   Lab Results  Component Value Date   TRIG 39 07/25/2023   Lab Results  Component Value Date   CHOLHDL 1.7 07/25/2023   Lab Results  Component Value Date   HGBA1C 5.1 04/22/2016      Assessment & Plan:   Problem List Items Addressed This Visit       Cardiovascular and Mediastinum   HTN (hypertension) - Primary   BP Readings from Last 3 Encounters:  03/25/24 (!) 147/67  11/22/23 (!) 155/61  07/25/23 (!) 141/53   Hypertension Blood  pressure management discussed. Systolic slightly elevated, diastolic normal.  Has upcoming appointment with cardiology - Continue current antihypertensive regimen Maxide 25(0.5 tablets daily) - advised a heart-healthy, low-salt, low-fat diet. - Engage in regular exercise as tolerated.       Relevant Orders   Basic Metabolic Panel     Respiratory   Asthma    Asthma management discussed. - Continue fluticasone -salmeterol (Advair) 100-50 MCG/ACT inhalation twice daily. - Continue albuterol  (Ventolin  HFA) 108 (90 Base) MCG/ACT inhalation every 6 hours as needed.zafirlukast  20mg  twice daily         Musculoskeletal and Integument   Age related osteoporosis   Continue calcium  carbonate 600 mg twice daily, vitamin D  5000 units daily - Arrange bone density test at new location, possibly on [address]. Will add on a vitamin D  lab       Pruritus    Intermittent itching reported. Hydrocortisone cream provides relief. Dry skin may contribute. - Continue using hydrocortisone cream as needed. - Use good moisturizing cream to prevent dry skin.         Other   Hyperlipidemia (Chronic)   Lab Results  Component Value Date   CHOL 158 07/25/2023   HDL 94 07/25/2023   LDLCALC 55 07/25/2023   TRIG 39 07/25/2023   CHOLHDL 1.7 07/25/2023    Cholesterol management discussed. - Continue rosuvastatin  (Crestor ) 5 mg oral daily.       Balance problems   Balance issues reported. No current use of cane or walker. Exercises discussed. - Encourage balance exercises. - Ensure home safety to prevent falls. - Consider cane  if balance issues worsen. Will order a shower chair       Meds ordered this encounter  Medications   DISCONTD: COVID-19 mRNA vaccine, Pfizer, (COMIRNATY) syringe    Sig: Inject 0.3 mLs into the muscle once for 1 dose.    Dispense:  0.3 mL    Refill:  0    Approved at provider discretion. Product selection permitted.   DISCONTD: COVID-19 mRNA vaccine, Pfizer,  (COMIRNATY) syringe    Sig: Inject 0.3 mLs into the muscle once for 1 dose.    Dispense:  0.3 mL    Refill:  0    Approved at provider discretion. Product selection permitted.    Follow-up: Return in about 4 months (around 07/25/2024) for HTN, CPE.    Sarah Valenzuela Sarah Kuper, FNP

## 2024-03-26 ENCOUNTER — Ambulatory Visit: Payer: Self-pay | Admitting: Nurse Practitioner

## 2024-03-26 ENCOUNTER — Other Ambulatory Visit: Payer: Self-pay | Admitting: Nurse Practitioner

## 2024-03-26 DIAGNOSIS — M810 Age-related osteoporosis without current pathological fracture: Secondary | ICD-10-CM

## 2024-03-26 LAB — BASIC METABOLIC PANEL WITH GFR
BUN/Creatinine Ratio: 13 (ref 12–28)
BUN: 9 mg/dL — ABNORMAL LOW (ref 10–36)
CO2: 23 mmol/L (ref 20–29)
Calcium: 9.5 mg/dL (ref 8.7–10.3)
Chloride: 94 mmol/L — ABNORMAL LOW (ref 96–106)
Creatinine, Ser: 0.67 mg/dL (ref 0.57–1.00)
Glucose: 88 mg/dL (ref 70–99)
Potassium: 3.8 mmol/L (ref 3.5–5.2)
Sodium: 131 mmol/L — ABNORMAL LOW (ref 134–144)
eGFR: 83 mL/min/1.73 (ref >59)

## 2024-03-28 DIAGNOSIS — I2510 Atherosclerotic heart disease of native coronary artery without angina pectoris: Secondary | ICD-10-CM | POA: Diagnosis not present

## 2024-03-28 DIAGNOSIS — E782 Mixed hyperlipidemia: Secondary | ICD-10-CM | POA: Diagnosis not present

## 2024-03-28 DIAGNOSIS — I10 Essential (primary) hypertension: Secondary | ICD-10-CM | POA: Diagnosis not present

## 2024-03-28 DIAGNOSIS — I340 Nonrheumatic mitral (valve) insufficiency: Secondary | ICD-10-CM | POA: Diagnosis not present

## 2024-03-28 LAB — VITAMIN D 25 HYDROXY (VIT D DEFICIENCY, FRACTURES): Vit D, 25-Hydroxy: 96.2 ng/mL (ref 30.0–100.0)

## 2024-03-28 LAB — SPECIMEN STATUS REPORT

## 2024-04-09 ENCOUNTER — Other Ambulatory Visit: Payer: Self-pay

## 2024-04-09 DIAGNOSIS — M810 Age-related osteoporosis without current pathological fracture: Secondary | ICD-10-CM

## 2024-04-25 ENCOUNTER — Other Ambulatory Visit: Payer: Self-pay | Admitting: Nurse Practitioner

## 2024-04-25 DIAGNOSIS — J302 Other seasonal allergic rhinitis: Secondary | ICD-10-CM

## 2024-05-28 ENCOUNTER — Other Ambulatory Visit: Payer: Self-pay | Admitting: Nurse Practitioner

## 2024-05-28 DIAGNOSIS — J302 Other seasonal allergic rhinitis: Secondary | ICD-10-CM

## 2024-05-28 DIAGNOSIS — E782 Mixed hyperlipidemia: Secondary | ICD-10-CM

## 2024-05-28 DIAGNOSIS — J4520 Mild intermittent asthma, uncomplicated: Secondary | ICD-10-CM

## 2024-06-25 ENCOUNTER — Other Ambulatory Visit: Payer: Self-pay | Admitting: Nurse Practitioner

## 2024-06-25 DIAGNOSIS — J302 Other seasonal allergic rhinitis: Secondary | ICD-10-CM

## 2024-07-24 ENCOUNTER — Other Ambulatory Visit: Payer: Self-pay | Admitting: Nurse Practitioner

## 2024-07-24 DIAGNOSIS — J302 Other seasonal allergic rhinitis: Secondary | ICD-10-CM

## 2024-07-25 NOTE — Telephone Encounter (Signed)
 fluticasone  (FLONASE ) 50 MCG/ACT nasal spray [Pharmacy Med Name: fluticasone  propionate 50 mcg/actuation nasal spray,suspension]

## 2024-07-30 ENCOUNTER — Ambulatory Visit: Payer: Self-pay

## 2024-07-30 NOTE — Telephone Encounter (Signed)
 FYI Only or Action Required?: FYI only for provider: appointment scheduled on 08/21/24.  Patient was last seen in primary care on 03/25/2024 by Paseda, Folashade R, FNP.  Called Nurse Triage reporting Vaginal Itching.  Symptoms began several months ago.  Interventions attempted: OTC medications: Vagisil cream.  Symptoms are: unchanged.  Triage Disposition: See PCP Within 2 Weeks  Patient/caregiver understands and will follow disposition?: Yes  Reason for Disposition  [1] Vaginal dryness or itching AND [2] nearing menopause or after menopause  Answer Assessment - Initial Assessment Questions Patient states that she has been experiencing vaginal itching intermittently for the last 2-3 months. She has tried Vagisil cream with no relief. Office visit advised, no available appts until 2/26 at patient's PCP office. Patient already has follow up visit scheduled for 2/25 with her PCP. She is requesting some recommendations for OTC options to treat vaginal itching. She also mentions wanting to speak with PCP about her appetite and feeling cold all the time at upcoming appt.   1. SYMPTOM: What's the main symptom you're concerned about? (e.g., pain, itching, dryness)     Itching  2. LOCATION: Where is the  Itching located? (e.g., inside/outside, left/right)      3. ONSET: When did the  symptoms  start?     About 2-3 months ago  4. PAIN: Is there any pain? If Yes, ask: How bad is it? (Scale: 1-10; mild, moderate, severe)     No pain  5. ITCHING: Is there any itching? If Yes, ask: How bad is it? (Scale: 1-10; mild, moderate, severe)     Mild-severe depending; intermittent  6. CAUSE: What do you think is causing the discharge? Have you had the same problem before? What happened then?     No discharge  7. OTHER SYMPTOMS: Do you have any other symptoms? (e.g., fever, itching, vaginal bleeding, pain with urination, injury to genital area, vaginal foreign body)     Denies  any other symptoms  8. PREGNANCY: Is there any chance you are pregnant? When was your last menstrual period?     NA  Protocols used: Vaginal Symptoms-A-AH  Copied from CRM [id number]. Topic: Clinical - Medical Advice >> Jul 30, 2024 11:32 AM Corin V wrote: Reason for CRM: Patient is experiencing vaginal itching and is wanting to know if there is possibly and over the counter medication or option she can try while she waits for her appointment on 2/25. Please call back to advise at [phone]

## 2024-07-30 NOTE — Telephone Encounter (Signed)
 Pt has been contacted and scheduled for sooner appointment on 08/05/24 at 10:40 am. She voiced her understanding. CB.

## 2024-07-31 ENCOUNTER — Encounter: Payer: Self-pay | Admitting: Nurse Practitioner

## 2024-08-05 ENCOUNTER — Ambulatory Visit: Payer: Self-pay | Admitting: Nurse Practitioner

## 2024-08-21 ENCOUNTER — Ambulatory Visit: Admitting: Nurse Practitioner

## 2024-12-06 ENCOUNTER — Ambulatory Visit: Payer: Self-pay | Admitting: Nurse Practitioner

## 2024-12-13 ENCOUNTER — Encounter: Payer: Self-pay | Admitting: Nurse Practitioner
